# Patient Record
Sex: Male | Born: 1965 | Race: White | Hispanic: No | Marital: Married | Smoking: Former smoker
Health system: Southern US, Community
[De-identification: ages and names within clinical notes are randomized; demographics above are authoritative.]

## PROBLEM LIST (undated history)

## (undated) VITALS — BP 154/85 | HR 71 | Temp 97.9°F | Resp 13 | Wt 175.7 lb

## (undated) DIAGNOSIS — C801 Malignant (primary) neoplasm, unspecified: Secondary | ICD-10-CM

## (undated) DIAGNOSIS — N189 Chronic kidney disease, unspecified: Secondary | ICD-10-CM

## (undated) HISTORY — PX: KNEE SURGERY: SHX244

## (undated) HISTORY — DX: Chronic kidney disease, unspecified: N18.9

## (undated) MED FILL — Acetaminophen Tab 325 MG: ORAL | Qty: 2 | Status: AC

## (undated) MED FILL — Diphenhydramine HCl Cap 25 MG: ORAL | Qty: 1 | Status: AC

---

## 2001-05-22 ENCOUNTER — Emergency Department (HOSPITAL_COMMUNITY): Admission: EM | Admit: 2001-05-22 | Discharge: 2001-05-22 | Payer: Self-pay | Admitting: Emergency Medicine

## 2002-12-16 ENCOUNTER — Encounter: Payer: Self-pay | Admitting: Emergency Medicine

## 2002-12-16 ENCOUNTER — Emergency Department (HOSPITAL_COMMUNITY): Admission: EM | Admit: 2002-12-16 | Discharge: 2002-12-17 | Payer: Self-pay | Admitting: Emergency Medicine

## 2003-04-07 ENCOUNTER — Emergency Department (HOSPITAL_COMMUNITY): Admission: EM | Admit: 2003-04-07 | Discharge: 2003-04-08 | Payer: Self-pay | Admitting: Emergency Medicine

## 2003-04-19 ENCOUNTER — Ambulatory Visit (HOSPITAL_COMMUNITY): Admission: RE | Admit: 2003-04-19 | Discharge: 2003-04-19 | Payer: Self-pay | Admitting: Cardiology

## 2005-06-08 ENCOUNTER — Emergency Department (HOSPITAL_COMMUNITY): Admission: EM | Admit: 2005-06-08 | Discharge: 2005-06-08 | Payer: Self-pay | Admitting: *Deleted

## 2006-10-14 ENCOUNTER — Emergency Department (HOSPITAL_COMMUNITY): Admission: EM | Admit: 2006-10-14 | Discharge: 2006-10-14 | Payer: Self-pay | Admitting: Emergency Medicine

## 2006-11-17 ENCOUNTER — Emergency Department (HOSPITAL_COMMUNITY): Admission: EM | Admit: 2006-11-17 | Discharge: 2006-11-17 | Payer: Self-pay | Admitting: Emergency Medicine

## 2007-11-06 ENCOUNTER — Emergency Department (HOSPITAL_COMMUNITY): Admission: EM | Admit: 2007-11-06 | Discharge: 2007-11-06 | Payer: Self-pay | Admitting: Emergency Medicine

## 2007-11-16 ENCOUNTER — Emergency Department (HOSPITAL_COMMUNITY): Admission: EM | Admit: 2007-11-16 | Discharge: 2007-11-16 | Payer: Self-pay | Admitting: Emergency Medicine

## 2008-04-21 ENCOUNTER — Emergency Department (HOSPITAL_COMMUNITY): Admission: EM | Admit: 2008-04-21 | Discharge: 2008-04-21 | Payer: Self-pay | Admitting: Emergency Medicine

## 2008-11-02 ENCOUNTER — Emergency Department (HOSPITAL_COMMUNITY): Admission: EM | Admit: 2008-11-02 | Discharge: 2008-11-02 | Payer: Self-pay | Admitting: Emergency Medicine

## 2010-08-16 NOTE — Op Note (Signed)
NAME:  Drew Cooper, Drew Cooper                   ACCOUNT NO.:  [id number]   MEDICAL RECORD NO.:  [id number]                   PATIENT TYPE:  OIB   LOCATION:  2899                                 FACILITY:  [facility]   PHYSICIAN:  Armanda Magic, M.D.                  DATE OF BIRTH:  June 07, 1965   DATE OF PROCEDURE:  04/19/2003  DATE OF DISCHARGE:                                 OPERATIVE REPORT   REFERRING PHYSICIAN:  Dr. Julian Reil.   INDICATIONS FOR PROCEDURE:  The patient is a very pleasant 45 year old white  male who has had four syncopal episodes in the past five years.  The last  one was in September when he was sitting down with his wife watching a  movie.  He went to get up to go to the kitchen, all of a sudden, felt a  sudden head rush, felt his heart beating fast and then passed out.  He woke  up and was very diaphoretic.  Symptoms area most consistent with vasovagal  syncope.  2D echocardiogram was completely normal.  He did wear a 24 hour  Holter monitor which revealed normal sinus rhythm, one episode of sinus tach  up to 152 beats per minute, occasional PACs and PVCs.  No correlation of  palpitations with arrhythmia.  His exercise treadmill test was completely  normal with no evidence of distal ischemia by EKG criteria.  He now presents  for tilt table testing.   DESCRIPTION OF PROCEDURE:  The patient was brought to the cardiac  catheterization laboratory in the fasting nonsedated state.  Informed  consent was obtained.  The patient was connected to continuous heart rate,  pulse oximetry monitoring, intermittent blood pressure monitoring.  The  patient's blood pressure was measured supine on the tilt table for a total  of five minutes.  Baseline blood pressure was 125/96 to 136/83 with heart  rates of 84 to 106 beats per minute.  The patient was then placed upright to  70 degrees.  Highest blood pressure achieved on upright tilt was 141/86.  Maximum heart rate on upright tilt was  144/92 mmHg and lowest blood pressure  on upright tilt was 117/95 mmHg.  The patient was asymptomatic during the  tilt.  He was then placed supine.  Isuprel was started at 7.5 mL which was  0.5 mcg.  This was titrated up to achieve a resting heart rate of greater  than 20% baseline.  He was then tilted back upright for a total of 15  minutes.  Maximum heart rate during upright tilt was 126 beats per minute.  Lowest blood pressure achieved on upright tilt was 119/78 mmHg.  Lowest  heart rate was 117 beats per minute.  At the end of the tilt, the patient  was placed supine.  The patient did not have any symptoms during upright  tilt.   ASSESSMENT:  1. History of syncope symptoms consistent with vasovagal syncope.  2. Negative tilt table test for syncope.   PLAN:  Will treat prophylactically for vasovagal syncope.  Start Zoloft 25  mg a day and Toprol XL 25 mg a day and follow-up with Armanda Magic, M.D. in  one month.                                               Armanda Magic, M.D.    TT/MEDQ  D:  04/19/2003  T:  04/19/2003  Job:  [id number]

## 2011-08-19 ENCOUNTER — Emergency Department (HOSPITAL_COMMUNITY)
Admission: EM | Admit: 2011-08-19 | Discharge: 2011-08-19 | Disposition: A | Discharge status: Home / Self Care | Payer: Self-pay | Attending: Emergency Medicine | Admitting: Emergency Medicine

## 2011-08-19 ENCOUNTER — Encounter (HOSPITAL_COMMUNITY): Payer: Self-pay | Admitting: Physical Medicine and Rehabilitation

## 2011-08-19 DIAGNOSIS — F17200 Nicotine dependence, unspecified, uncomplicated: Secondary | ICD-10-CM | POA: Insufficient documentation

## 2011-08-19 DIAGNOSIS — L723 Sebaceous cyst: Secondary | ICD-10-CM | POA: Insufficient documentation

## 2011-08-19 MED ORDER — HYDROCODONE-ACETAMINOPHEN 5-325 MG PO TABS
1.0000 | ORAL_TABLET | Freq: Four times a day (QID) | ORAL | Status: AC | PRN
Start: 2011-08-19 — End: 2011-08-29

## 2011-08-19 NOTE — ED Notes (Signed)
Pt presents to department for evaluation of possible cyst/abscess to center of chest. Ongoing x1 week. Raised area the size of a quarter noted. Area warm and painful to touch. 5/10 pain at present. He is alert and oriented x4. No signs of distress noted at the time.

## 2011-08-19 NOTE — Discharge Instructions (Signed)
Epidermal Cyst An epidermal cyst is usually a small, painless lump under the skin. Cysts often occur on the face, neck, stomach, chest, or genitals. The cyst may be filled with a bad smelling paste. Do not pop your cyst. Popping the cyst can cause pain and puffiness (swelling). HOME CARE   Only take medicines as told by your doctor.   Take your medicine (antibiotics) as told. Finish it even if you start to feel better.  GET HELP RIGHT AWAY IF:  Your cyst is tender, red, or puffy.   You are not getting better, or you are getting worse.   You have any questions or concerns.  MAKE SURE YOU:  Understand these instructions.   Will watch your condition.   Will get help right away if you are not doing well or get worse.  Document Released: 04/24/2004 Document Revised: 03/06/2011 Document Reviewed: 09/23/2010 [facility] Patient Information 2012 [place], [place].  Return in 2 days to have the packing removed.  Keep the area clean.  He can shower.  Did not pull the packing out prematurely.  Return earlier for any new or symptoms.  Antibiotics not needed.  A prescription given for some pain medicine.  If he needed.

## 2011-08-19 NOTE — ED Provider Notes (Addendum)
History   This chart was scribed for Drew Jakes, MD by Brooks Sailors. The patient was seen in room STRE8/STRE8. Patient's care was started at 1740.   CSN: [id number]  Arrival date & time 08/19/11  1740   First MD Initiated Contact with Patient 08/19/11 1840      Chief Complaint  Patient presents with  . Cyst    (Consider location/radiation/quality/duration/timing/severity/associated sxs/prior treatment) HPI LARREN COPES is a 46 y.o. male who presents to the Emergency Department complaining of a 4 cm cyst on his chest onset one week ago and worsening since with associated soreness. Pt says no pus or drainage from the cyst this week. Pt says the cyst was small at first and has been becoming larger for one week.    No past medical history on file.  No past surgical history on file.  History reviewed. No pertinent family history.  History  Substance Use Topics  . Smoking status: Current Everyday Smoker -- 1.0 packs/day    Types: Cigarettes  . Smokeless tobacco: Not on file  . Alcohol Use: Yes      Review of Systems  Constitutional: Negative for fever and chills.  Respiratory: Negative for cough and shortness of breath.   Cardiovascular: Negative for chest pain.  Gastrointestinal: Negative for nausea, vomiting, abdominal pain and diarrhea.  Skin: Negative for rash.  All other systems reviewed and are negative.    Allergies  Review of patient's allergies indicates no known allergies.  Home Medications   Current Outpatient Rx  Name Route Sig Dispense Refill  . DIPHENHYDRAMINE HCL 25 MG PO CAPS Oral Take 25 mg by mouth at bedtime as needed. For allergy symptoms.    Marland Kitchen HYDROCODONE-ACETAMINOPHEN 5-325 MG PO TABS Oral Take 1-2 tablets by mouth every 6 (six) hours as needed for pain. 10 tablet 0    There were no vitals taken for this visit.  Physical Exam  Nursing note and vitals reviewed. Constitutional: He is oriented to person, place, and time. He  appears well-developed and well-nourished.  HENT:  Head: Normocephalic and atraumatic.  Eyes: Conjunctivae and EOM are normal. Pupils are equal, round, and reactive to light.  Neck: Normal range of motion. Neck supple.  Cardiovascular: Normal rate, regular rhythm and normal heart sounds.  Exam reveals no gallop and no friction rub.   No murmur heard. Pulmonary/Chest: Effort normal and breath sounds normal.       Inferior sternum, 3cm redness. 2cm raised area, no pustules. Faint surrounding redness 6cm. Fluctuance in center, 3cm induration.   Abdominal: Soft. Bowel sounds are normal. He exhibits no distension. There is no tenderness.  Musculoskeletal: Normal range of motion.  Neurological: He is alert and oriented to person, place, and time.  Skin: Skin is warm and dry.  Psychiatric: He has a normal mood and affect.    ED Course  Procedures (including critical care time)   Pt seen at 1846  INCISION AND DRAINAGE PROCEDURE NOTE: Patient identification was confirmed and verbal consent was obtained. This procedure was performed by Drew Jakes, MD at 8:15 PM. Site: sternal chest wall Sterile procedures observed Needle size: 20 gauge and 28 guage Anesthetic used (type and amt): lidocaine 2%, 1cc  Blade size: scalpel  Drainage: 5 cc of sebaceous material and purulent material.  Complexity: Complex Packing used quarter inch iodoform packing strip.   Spread with hemostat, sebaceous cyst core  cleaned with betadine and alcohol  Site anesthetized, incision made over site, wound drained  and explored loculations, rinsed with copious amounts of normal saline, wound packed with sterile gauze, covered with dry, sterile dressing.  Pt tolerated procedure well without complications.  Instructions for care discussed verbally and pt provided with additional written instructions for homecare and f/u.   Labs Reviewed - No data to display No results found.   1. Sebaceous cyst        MDM  See  I&D note I&D of an acutely infected sebaceous cyst at the distal sternal area.  Patient tolerated the procedure well.  We'll followup in 2 days out of the packing removed.  And abuts not required.  Culture, not taken due to being a classic sebaceous cyst.      I personally performed the services described in this documentation, which was scribed in my presence. The recorded information has been reviewed and considered.     Drew Jakes, MD 08/19/11 2016  Drew Jakes, MD 08/19/11 2016

## 2013-01-04 ENCOUNTER — Emergency Department (HOSPITAL_COMMUNITY)
Admission: EM | Admit: 2013-01-04 | Discharge: 2013-01-04 | Disposition: A | Discharge status: Home / Self Care | Payer: Self-pay | Attending: Emergency Medicine | Admitting: Emergency Medicine

## 2013-01-04 ENCOUNTER — Encounter (HOSPITAL_COMMUNITY): Payer: Self-pay | Admitting: Emergency Medicine

## 2013-01-04 DIAGNOSIS — K047 Periapical abscess without sinus: Secondary | ICD-10-CM | POA: Insufficient documentation

## 2013-01-04 DIAGNOSIS — K029 Dental caries, unspecified: Secondary | ICD-10-CM | POA: Insufficient documentation

## 2013-01-04 DIAGNOSIS — F17200 Nicotine dependence, unspecified, uncomplicated: Secondary | ICD-10-CM | POA: Insufficient documentation

## 2013-01-04 MED ORDER — PENICILLIN V POTASSIUM 500 MG PO TABS
500.0000 mg | ORAL_TABLET | Freq: Once | ORAL | Status: AC
  Administered 2013-01-04: 500 mg via ORAL
  Filled 2013-01-04: qty 1

## 2013-01-04 MED ORDER — PENICILLIN V POTASSIUM 500 MG PO TABS: 500.0000 mg | ORAL_TABLET | Freq: Three times a day (TID) | ORAL | Status: DC

## 2013-01-04 NOTE — ED Provider Notes (Signed)
Medical screening examination/treatment/procedure(s) were performed by non-physician practitioner and as supervising physician I was immediately available for consultation/collaboration.   T , MD 01/04/13 2328 

## 2013-01-04 NOTE — ED Notes (Signed)
Swelling and pain on l/side of mouth x 24 hrs

## 2013-01-04 NOTE — ED Provider Notes (Signed)
CSN: [id number]     Arrival date & time 01/04/13  1740 History   First MD Initiated Contact with Patient 01/04/13 1746    This chart was scribed for Nelle Don, a non-physician practitioner working with Toy Baker, MD by Lewanda Rife, ED Scribe. This patient was seen in room WTR7/WTR7 and the patient's care was started at 6:03 PM     Chief Complaint  Patient presents with  . Dental Problem  . Facial Swelling   (Consider location/radiation/quality/duration/timing/severity/associated sxs/prior Treatment) The history is provided by the patient. No language interpreter was used.   HPI Comments: Drew Cooper is a 47 y.o. male who presents to the Emergency Department complaining of upper left quadrant dental pain onset yesterday. Describes pain as sharp and non-radiating.  Denies associated neck swelling, fever, difficulty breathing, and dysphagia. Denies any aggravating factors. Reports pain is mildly alleviated with ibuprofen.   History reviewed. No pertinent past medical history. History reviewed. No pertinent past surgical history. Family History  Problem Relation Age of Onset  . Hypertension Mother   . Heart failure Mother   . Diabetes Mother    History  Substance Use Topics  . Smoking status: Current Every Day Smoker -- 1.00 packs/day    Types: Cigarettes  . Smokeless tobacco: Not on file  . Alcohol Use: Yes    Review of Systems  Constitutional: Negative for fever.  HENT: Positive for facial swelling and dental problem. Negative for ear pain, sore throat, trouble swallowing and neck pain.   Respiratory: Negative for shortness of breath and stridor.   Skin: Negative for color change.  Neurological: Negative for headaches.  All other systems reviewed and are negative.   A complete 10 system review of systems was obtained and all systems are negative except as noted in the HPI and PMHx.    Allergies  Review of patient's allergies indicates no known  allergies.  Home Medications   Current Outpatient Rx  Name  Route  Sig  Dispense  Refill  . diphenhydrAMINE (BENADRYL) 25 mg capsule   Oral   Take 25 mg by mouth at bedtime as needed. For allergy symptoms.          There were no vitals taken for this visit. Physical Exam  Nursing note and vitals reviewed. Constitutional: He is oriented to person, place, and time. He appears well-developed and well-nourished. No distress.  HENT:  Head: Normocephalic and atraumatic.  Right Ear: Tympanic membrane, external ear and ear canal normal.  Left Ear: Tympanic membrane, external ear and ear canal normal.  Nose: Nose normal.  Mouth/Throat: Uvula is midline, oropharynx is clear and moist and mucous membranes are normal. No trismus in the jaw. Abnormal dentition. Dental caries present. No dental abscesses or edematous. No tonsillar abscesses.  Advanced periodontal disease. Patient with L maxillary tooth pain and tenderness to palpation in area of 1-3 molars, which are fractured. Gross swelling without discrete abscess.  Eyes: EOM are normal. Pupils are equal, round, and reactive to light.  Neck: Normal range of motion. Neck supple. No tracheal deviation present.  No neck swelling or Lugwig's angina  Cardiovascular: Normal rate.   Pulmonary/Chest: Effort normal. No respiratory distress.  Musculoskeletal: Normal range of motion.  Neurological: He is alert and oriented to person, place, and time.  Skin: Skin is warm and dry.  Psychiatric: He has a normal mood and affect. His behavior is normal.    ED Course  Procedures (including critical care time)  COORDINATION  OF CARE:  Nursing notes reviewed. Vital signs reviewed. Initial pt interview and examination performed.  Treatment plan initiated: Medications  penicillin v potassium (VEETID) tablet 500 mg (not administered)   Initial diagnostic testing ordered.    6:08 PM-Discussed treatment plan, which includes penicillin PO to treat  infection with pt at bedside and pt agreed to plan.   Pt informed of return precautions and is comfortable with discharge at this time.    Labs Review Labs Reviewed - No data to display Imaging Review No results found.  Patient seen and examined. Work-up initiated. Medications ordered.   Vital signs reviewed and are as follows: Filed Vitals:   01/04/13 1820  BP: 138/88  Pulse: 89  Temp: 98.3 F (36.8 C)  Resp: 18    Patient counseled to take prescribed medications as directed, return with worsening facial or neck swelling, and to follow-up with their dentist as soon as possible.    MDM   1. Dental abscess    Patient with toothache.  No gross abscess.  Exam unconcerning for Ludwig's angina or other deep tissue infection in neck.  Will treat with penicillin. To control pain at home with NSAIDs. Urged patient to follow-up with dentist.    I personally performed the services described in this documentation, which was scribed in my presence. The recorded information has been reviewed and is accurate.     Renne Crigler, PA-C 01/04/13 1826

## 2013-05-03 ENCOUNTER — Emergency Department (HOSPITAL_COMMUNITY)
Admission: EM | Admit: 2013-05-03 | Discharge: 2013-05-03 | Disposition: A | Discharge status: Home / Self Care | Payer: Self-pay | Attending: Emergency Medicine | Admitting: Emergency Medicine

## 2013-05-03 ENCOUNTER — Encounter (HOSPITAL_COMMUNITY): Payer: Self-pay | Admitting: Emergency Medicine

## 2013-05-03 DIAGNOSIS — R6884 Jaw pain: Secondary | ICD-10-CM | POA: Insufficient documentation

## 2013-05-03 DIAGNOSIS — F17200 Nicotine dependence, unspecified, uncomplicated: Secondary | ICD-10-CM | POA: Insufficient documentation

## 2013-05-03 LAB — CBC
HCT: 41.2 % (ref 39.0–52.0)
Hemoglobin: 14.2 g/dL (ref 13.0–17.0)
MCH: 30 pg (ref 26.0–34.0)
MCHC: 34.5 g/dL (ref 30.0–36.0)
MCV: 86.9 fL (ref 78.0–100.0)
Platelets: 241 10*3/uL (ref 150–400)
RBC: 4.74 MIL/uL (ref 4.22–5.81)
RDW: 12.9 % (ref 11.5–15.5)
WBC: 16.7 10*3/uL — ABNORMAL HIGH (ref 4.0–10.5)

## 2013-05-03 LAB — URINALYSIS, ROUTINE W REFLEX MICROSCOPIC
Bilirubin Urine: NEGATIVE
Glucose, UA: NEGATIVE mg/dL
Hgb urine dipstick: NEGATIVE
Ketones, ur: NEGATIVE mg/dL
Leukocytes, UA: NEGATIVE
Nitrite: NEGATIVE
Protein, ur: NEGATIVE mg/dL
Specific Gravity, Urine: 1.019 (ref 1.005–1.030)
Urobilinogen, UA: 0.2 mg/dL (ref 0.0–1.0)
pH: 5 (ref 5.0–8.0)

## 2013-05-03 LAB — BASIC METABOLIC PANEL
BUN: 13 mg/dL (ref 6–23)
CO2: 24 mEq/L (ref 19–32)
Calcium: 8.6 mg/dL (ref 8.4–10.5)
Chloride: 104 mEq/L (ref 96–112)
Creatinine, Ser: 0.83 mg/dL (ref 0.50–1.35)
GFR calc Af Amer: >90 mL/min (ref >90)
GFR calc non Af Amer: >90 mL/min (ref >90)
Glucose, Bld: 103 mg/dL — ABNORMAL HIGH (ref 70–99)
Potassium: 4.1 mEq/L (ref 3.7–5.3)
Sodium: 138 mEq/L (ref 137–147)

## 2013-05-03 MED ORDER — AMOXICILLIN-POT CLAVULANATE 875-125 MG PO TABS: 1.0000 | ORAL_TABLET | Freq: Two times a day (BID) | ORAL | Status: DC

## 2013-05-03 MED ORDER — SODIUM CHLORIDE 0.9 % IV BOLUS (SEPSIS)
1000.0000 mL | Freq: Once | INTRAVENOUS | Status: AC
  Administered 2013-05-03: 1000 mL via INTRAVENOUS

## 2013-05-03 MED ORDER — HYDROCODONE-ACETAMINOPHEN 5-325 MG PO TABS: 1.0000 | ORAL_TABLET | Freq: Four times a day (QID) | ORAL | Status: DC | PRN

## 2013-05-03 MED ORDER — IBUPROFEN 800 MG PO TABS: 800.0000 mg | ORAL_TABLET | Freq: Three times a day (TID) | ORAL | Status: DC | PRN

## 2013-05-03 MED ORDER — MORPHINE SULFATE 4 MG/ML IJ SOLN
4.0000 mg | Freq: Once | INTRAMUSCULAR | Status: AC
  Administered 2013-05-03: 4 mg via INTRAVENOUS
  Filled 2013-05-03: qty 1

## 2013-05-03 MED ORDER — KETOROLAC TROMETHAMINE 30 MG/ML IJ SOLN
30.0000 mg | Freq: Once | INTRAMUSCULAR | Status: AC
  Administered 2013-05-03: 30 mg via INTRAVENOUS
  Filled 2013-05-03: qty 1

## 2013-05-03 NOTE — ED Notes (Signed)
Patient states he has had right ear pain x 3 weeks and today is worse. Patient stated he had LOC today around noon today. Patient states he was taking a family member's Penicillin, but not getting any better.

## 2013-05-03 NOTE — Progress Notes (Signed)
   CARE MANAGEMENT ED NOTE 05/03/2013  Patient:  Drew Cooper,Drew Cooper   Account Number:  [id number]  Date Initiated:  05/03/2013  Documentation initiated by:  Radford PaxFERRERO,  Subjective/Objective Assessment:   Patient presents to Ed with right ear pain     Subjective/Objective Assessment Detail:   No pertinent pmhx.     Action/Plan:   Action/Plan Detail:   Anticipated DC Date:       Status Recommendation to Physician:   Result of Recommendation:    Other ED Services  Consult Working Plan    DC Planning Services  Other  PCP issues    Choice offered to / List presented to:            Status of service:  Completed, signed off  ED Comments:   ED Comments Detail:  Patient confirms he doe snot have apcp or insurnace.  [facility] provided patient with Cooper list of pcps who accept self pay patients, information regarding Medicaid and Affordable Care Act for insurance, phone number to inquire about the orange card, list of discount pharmacies and website needymeds.org for medication assistance, list of financiall assistance in the community such as [employer] and local churches, urban ministries, [employer] health and wellness and dental assistance for uninsured patients.  Patient thankful for resources.  No further EDCM needs at this time.

## 2013-05-03 NOTE — Discharge Instructions (Signed)
Return here as needed.  Followup with your primary care Dr. for recheck or an urgent care.  Use warm compresses or heat over the area that is sore

## 2013-05-03 NOTE — ED Provider Notes (Signed)
CSN: [id number]     Arrival date & time 05/03/13  1554 History   First MD Initiated Contact with Patient 05/03/13 1614     Chief Complaint  Patient presents with  . Otalgia   (Consider location/radiation/quality/duration/timing/severity/associated sxs/prior Treatment) HPI Patient presents emergency department with pain in his right jaw and ear.  Patient, states, that started 3 weeks ago, but was worse today.  The patient, states, that he then lost consciousness for one to 2 seconds earlier today.  The patient, states, that he's been taking it.  Family members, penicillin.  Patient, states he thinks it may be a tooth over his ear.  He is unsure.  Patient denies fever, nausea, vomiting, diarrhea, headache, blurred vision, weakness, numbness, dizziness rash or cough History reviewed. No pertinent past medical history. Past Surgical History  Procedure Laterality Date  . Knee surgery     Family History  Problem Relation Age of Onset  . Hypertension Mother   . Heart failure Mother   . Diabetes Mother    History  Substance Use Topics  . Smoking status: Current Every Day Smoker -- 0.50 packs/day    Types: Cigarettes  . Smokeless tobacco: Never Used  . Alcohol Use: Yes     Comment: 6 pack week    Review of Systems All other systems negative except as documented in the HPI. All pertinent positives and negatives as reviewed in the HPI. Allergies  Review of patient's allergies indicates no known allergies.  Home Medications   Current Outpatient Rx  Name  Route  Sig  Dispense  Refill  . ibuprofen (ADVIL,MOTRIN) 200 MG tablet   Oral   Take 400 mg by mouth every 6 (six) hours as needed (pain).          BP 135/82  Pulse 93  Temp(Src) 97.5 F (36.4 C) (Oral)  Resp 18  Ht 5\' 10"  (1.778 m)  Wt 200 lb (90.719 kg)  BMI 28.70 kg/m2  SpO2 100% Physical Exam  Nursing note and vitals reviewed. Constitutional: He is oriented to person, place, and time. He appears well-developed and  well-nourished. No distress.  HENT:  Head: Normocephalic and atraumatic.  Mouth/Throat: Oropharynx is clear and moist.  Eyes: Pupils are equal, round, and reactive to light.  Neck: Normal range of motion. Neck supple.  Cardiovascular: Normal rate, regular rhythm and normal heart sounds.  Exam reveals no gallop and no friction rub.   No murmur heard. Pulmonary/Chest: Effort normal and breath sounds normal. No respiratory distress.  Neurological: He is alert and oriented to person, place, and time. He exhibits normal muscle tone. Coordination normal.  Skin: Skin is warm and dry. No rash noted. No erythema.    ED Course  Procedures (including critical care time) Labs Review Labs Reviewed  CBC - Abnormal; Notable for the following:    WBC 16.7 (*)    All other components within normal limits  BASIC METABOLIC PANEL - Abnormal; Notable for the following:    Glucose, Bld 103 (*)    All other components within normal limits  URINALYSIS, ROUTINE W REFLEX MICROSCOPIC   Imaging Review No results found.  EKG Interpretation    Date/Time:  Tuesday May 03 2013 16:18:12 EST Ventricular Rate:  81 PR Interval:  128 QRS Duration: 100 QT Interval:  363 QTC Calculation: 421 R Axis:   -25 Text Interpretation:  Sinus rhythm Borderline left axis deviation No significant change since last tracing Confirmed by KNAPP  MD-J, JON ([id number]) on 05/03/2013 4:54:35  PM            Patient most likely has dental pain, based on his physical exam, his ear.  Does not show any signs of infection.  The TM is normal.  The canal was normal without signs of inflammation or redness.  Patient does have multiple areas of dental decay.  Patient does not have any airway compromise or signs of swelling under the tongue.A.  She is a rest return here as needed.  The patient was able to ambulate without difficulty   TARAN HAYNESWORTH, PA-C 05/04/13 0136

## 2013-05-04 NOTE — ED Provider Notes (Signed)
Medical screening examination/treatment/procedure(s) were performed by non-physician practitioner and as supervising physician I was immediately available for consultation/collaboration.  EKG Interpretation    Date/Time:  Tuesday May 03 2013 16:18:12 EST Ventricular Rate:  81 PR Interval:  128 QRS Duration: 100 QT Interval:  363 QTC Calculation: 421 R Axis:   -25 Text Interpretation:  Sinus rhythm Borderline left axis deviation No significant change since last tracing Confirmed by   MD-J,  ([id number]) on 05/03/2013 4:54:35 PM             Celene KrasJon R , MD 05/04/13 2255

## 2014-01-04 ENCOUNTER — Emergency Department (HOSPITAL_COMMUNITY)
Admission: EM | Admit: 2014-01-04 | Discharge: 2014-01-05 | Disposition: A | Discharge status: Home / Self Care | Payer: Self-pay | Attending: Emergency Medicine | Admitting: Emergency Medicine

## 2014-01-04 ENCOUNTER — Emergency Department (HOSPITAL_COMMUNITY): Payer: Self-pay

## 2014-01-04 ENCOUNTER — Encounter (HOSPITAL_COMMUNITY): Payer: Self-pay | Admitting: Emergency Medicine

## 2014-01-04 DIAGNOSIS — W1830XA Fall on same level, unspecified, initial encounter: Secondary | ICD-10-CM | POA: Insufficient documentation

## 2014-01-04 DIAGNOSIS — L03114 Cellulitis of left upper limb: Secondary | ICD-10-CM | POA: Insufficient documentation

## 2014-01-04 DIAGNOSIS — Z792 Long term (current) use of antibiotics: Secondary | ICD-10-CM | POA: Insufficient documentation

## 2014-01-04 DIAGNOSIS — S50812A Abrasion of left forearm, initial encounter: Secondary | ICD-10-CM | POA: Insufficient documentation

## 2014-01-04 DIAGNOSIS — Y9289 Other specified places as the place of occurrence of the external cause: Secondary | ICD-10-CM | POA: Insufficient documentation

## 2014-01-04 DIAGNOSIS — Z79899 Other long term (current) drug therapy: Secondary | ICD-10-CM | POA: Insufficient documentation

## 2014-01-04 DIAGNOSIS — Z720 Tobacco use: Secondary | ICD-10-CM | POA: Insufficient documentation

## 2014-01-04 DIAGNOSIS — Y9389 Activity, other specified: Secondary | ICD-10-CM | POA: Insufficient documentation

## 2014-01-04 MED ORDER — LIDOCAINE HCL 1 % IJ SOLN
INTRAMUSCULAR | Status: AC
  Administered 2014-01-04: 20 mL
  Filled 2014-01-04: qty 20

## 2014-01-04 MED ORDER — CEFTRIAXONE SODIUM 1 G IJ SOLR
1.0000 g | Freq: Once | INTRAMUSCULAR | Status: AC
  Administered 2014-01-04: 1 g via INTRAMUSCULAR
  Filled 2014-01-04: qty 10

## 2014-01-04 MED ORDER — SULFAMETHOXAZOLE-TRIMETHOPRIM 800-160 MG PO TABS: 1.0000 | ORAL_TABLET | Freq: Two times a day (BID) | ORAL | Status: AC

## 2014-01-04 MED ORDER — CEFTRIAXONE SODIUM 250 MG IJ SOLR: 250.0000 mg | Freq: Once | INTRAMUSCULAR | Status: DC

## 2014-01-04 MED ORDER — CEPHALEXIN 500 MG PO CAPS: 500.0000 mg | ORAL_CAPSULE | Freq: Four times a day (QID) | ORAL | Status: DC

## 2014-01-04 NOTE — Discharge Instructions (Signed)
Take your antibiotics as prescribed. Do not stop her antibiotics early. Return to emergency department if you develop fever, worsening redness despite antibiotic treatment, red streaking up or down your arm, or any of the symptoms listed below.  Cellulitis Cellulitis is an infection of the skin and the tissue beneath it. The infected area is usually red and tender. Cellulitis occurs most often in the arms and lower legs.  CAUSES  Cellulitis is caused by bacteria that enter the skin through cracks or cuts in the skin. The most common types of bacteria that cause cellulitis are staphylococci and streptococci. SIGNS AND SYMPTOMS   Redness and warmth.  Swelling.  Tenderness or pain.  Fever. DIAGNOSIS  Your health care provider can usually determine what is wrong based on a physical exam. Blood tests may also be done. TREATMENT  Treatment usually involves taking an antibiotic medicine. HOME CARE INSTRUCTIONS   Take your antibiotic medicine as directed by your health care provider. Finish the antibiotic even if you start to feel better.  Keep the infected arm or leg elevated to reduce swelling.  Apply a warm cloth to the affected area up to 4 times per day to relieve pain.  Take medicines only as directed by your health care provider.  Keep all follow-up visits as directed by your health care provider. SEEK MEDICAL CARE IF:   You notice red streaks coming from the infected area.  Your red area gets larger or turns dark in color.  Your bone or joint underneath the infected area becomes painful after the skin has healed.  Your infection returns in the same area or another area.  You notice a swollen bump in the infected area.  You develop new symptoms.  You have a fever. SEEK IMMEDIATE MEDICAL CARE IF:   You feel very sleepy.  You develop vomiting or diarrhea.  You have a general ill feeling (malaise) with muscle aches and pains. MAKE SURE YOU:   Understand these  instructions.  Will watch your condition.  Will get help right away if you are not doing well or get worse. Document Released: 12/25/2004 Document Revised: 08/01/2013 Document Reviewed: 06/02/2011 [facility] Patient Information 2015 [place]. This information is not intended to replace advice given to you by your health care provider. Make sure you discuss any questions you have with your health care provider.

## 2014-01-04 NOTE — ED Notes (Addendum)
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 Now has arm pain and swelling, sts it "feels hot in there." CMS intact.

## 2014-01-04 NOTE — ED Provider Notes (Signed)
CSN: [id number]     Arrival date & time 01/04/14  1725 History   First MD Initiated Contact with Patient 01/04/14 2250     This chart was scribed for non-physician practitioner working with Dr. Tomasita Crumble by Arlan Organ, ED Scribe. This patient was seen in room WTR9/WTR9 and the patient's care was started at 12:51 AM.   Chief Complaint  Patient presents with  . Arm Pain  . Arm Swelling   The history is provided by the patient. No language interpreter was used.   HPI Comments: Drew Cooper is a 48 y.o. male who presents to the Emergency Department complaining of constant, moderate L forearm pain x 5 days that is unchanged. Pt also reports associated swelling, redness, and warmth  to the area. Drew Cooper states he fell and scraped his arm against some shingles on a roof while working on a work project. Pt has not tried any OTC medications or any home remedies to help manages symptoms. No loss of sensation, weakness, or numbness. No known allergies to medications.  History reviewed. No pertinent past medical history. Past Surgical History  Procedure Laterality Date  . Knee surgery     Family History  Problem Relation Age of Onset  . Hypertension Mother   . Heart failure Mother   . Diabetes Mother    History  Substance Use Topics  . Smoking status: Current Every Day Smoker -- 0.50 packs/day    Types: Cigarettes  . Smokeless tobacco: Never Used  . Alcohol Use: Yes     Comment: 6 pack week    Review of Systems  Constitutional: Negative for fever and chills.  Musculoskeletal: Positive for arthralgias.  Neurological: Negative for weakness and numbness.  All other systems reviewed and are negative.   Allergies  Review of patient's allergies indicates no known allergies.  Home Medications   Prior to Admission medications   Medication Sig Start Date End Date Taking? Authorizing Provider  amoxicillin-clavulanate (AUGMENTIN) 875-125 MG per tablet Take 1 tablet by mouth  every 12 (twelve) hours. 05/03/13   Jamesetta Orleans Lawyer, PA-C  cephALEXin (KEFLEX) 500 MG capsule Take 1 capsule (500 mg total) by mouth 4 (four) times daily. 01/04/14   Antony Madura, PA-C  HYDROcodone-acetaminophen (NORCO/VICODIN) 5-325 MG per tablet Take 1 tablet by mouth every 6 (six) hours as needed for moderate pain. 05/03/13   Jamesetta Orleans Lawyer, PA-C  ibuprofen (ADVIL,MOTRIN) 200 MG tablet Take 400 mg by mouth every 6 (six) hours as needed (pain).    Historical Provider, MD  ibuprofen (ADVIL,MOTRIN) 800 MG tablet Take 1 tablet (800 mg total) by mouth every 8 (eight) hours as needed. 05/03/13   Jamesetta Orleans Lawyer, PA-C  sulfamethoxazole-trimethoprim (BACTRIM DS,SEPTRA DS) 800-160 MG per tablet Take 1 tablet by mouth 2 (two) times daily. 01/04/14 01/11/14  Antony Madura, PA-C   Triage Vitals: BP 142/94  Pulse 81  Temp(Src) 98.2 F (36.8 C) (Oral)  Resp 18  SpO2 100%   Physical Exam  Nursing note and vitals reviewed. Constitutional: He is oriented to person, place, and time. He appears well-developed and well-nourished. No distress.  Nontoxic/nonseptic appearing  HENT:  Head: Normocephalic and atraumatic.  Eyes: Conjunctivae and EOM are normal. No scleral icterus.  Neck: Normal range of motion. Neck supple.  Cardiovascular: Normal rate, regular rhythm and intact distal pulses.   Distal radial pulse 2+ in left upper extremity  Pulmonary/Chest: Effort normal. No respiratory distress.  Musculoskeletal: Normal range of motion. He exhibits tenderness.  Mild tenderness to palpation of the dorsal aspect of proximal left forearm. There is mild associated warmth and erythema; small 1cm diameter area of induration c/w endorsed hx of purulent drainage. No red linear streaking.  Neurological: He is alert and oriented to person, place, and time. He exhibits normal muscle tone. Coordination normal.  Sensation to light touch intact. 5/5 strength against resistance with left elbow flexion and extension.   Skin: Skin is warm and dry. No rash noted. He is not diaphoretic. No erythema. No pallor.  Psychiatric: He has a normal mood and affect. His behavior is normal.    ED Course  Procedures (including critical care time)  DIAGNOSTIC STUDIES: Oxygen Saturation is 100% on RA, Normal by my interpretation.    COORDINATION OF CARE: 12:51 AM- Will order DG Elbow complete L and DG Forearm L. Discussed treatment plan with pt at bedside and pt agreed to plan.     Labs Review Labs Reviewed - No data to display  Imaging Review Dg Elbow Complete Left  01/04/2014   CLINICAL DATA:  Patient hit arm on scaffold at work several days ago with persistent pain  EXAM: LEFT ELBOW - COMPLETE 3+ VIEW  COMPARISON:  None.  FINDINGS: Frontal, lateral, and bilateral oblique views were obtained. There is no fracture, dislocation, or effusion. There is osteoarthritic change with spurring along the olecranon and coracoid processes of the proximal ulna. No erosive change.  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 No fracture or dislocation. No appreciable joint effusion.   Electronically Signed   By: Bretta BangWilliam  Woodruff M.D.   On: 01/04/2014 21:02   Dg Forearm Left  01/04/2014   CLINICAL DATA:  Blow to the left forearm a few days ago. Pain with swelling and redness.  EXAM: LEFT FOREARM - 2 VIEW  COMPARISON:  None.  FINDINGS: No acute bony or joint abnormality is identified. No soft tissue gas collection or radiopaque foreign body is seen. Mild degenerative change is present about the left elbow.  IMPRESSION: No acute abnormality.   Electronically Signed   By: Drusilla Kannerhomas  Dalessio M.D.   On: 01/04/2014 21:02     EKG Interpretation None      MDM   Final diagnoses:  Cellulitis of arm, left    48 year old male presents to the emergency department for pain, redness, and swelling to left upper extremity. Symptom onset after patient had a fall 5 days ago. Patient has evidence of abrasion with secondary cellulitic  changes. No active purulent drainage. No fluctuance. No evidence of drainable abscess. Normal range of motion of left elbow. No evidence of septic joint. Imaging negative for free air or bony deformity. Treatment initiated with IM Rocephin in ED. Will discharge patient on Bactrim and Keflex. Return precautions discussed in provided. Patient agreeable to plan with no unaddressed concerns.  I personally performed the services described in this documentation, which was scribed in my presence. The recorded information has been reviewed and is accurate.    Filed Vitals:   01/04/14 1742 01/04/14 2150 01/05/14 0003  BP: 133/88 148/84 142/94  Pulse: 96 88 81  Temp: 98.2 F (36.8 C)    TempSrc: Oral    Resp: 20 18 18   SpO2: 99% 100% 100%     Antony MaduraKelly , PA-C 01/05/14 0056

## 2014-01-05 NOTE — ED Provider Notes (Signed)
Medical screening examination/treatment/procedure(s) were performed by non-physician practitioner and as supervising physician I was immediately available for consultation/collaboration.   EKG Interpretation None        Tomasita CrumbleAdeleke , MD 01/05/14 [phone]

## 2016-08-04 ENCOUNTER — Emergency Department (HOSPITAL_COMMUNITY)
Admission: EM | Admit: 2016-08-04 | Discharge: 2016-08-04 | Disposition: A | Discharge status: Home / Self Care | Payer: Self-pay | Attending: Emergency Medicine | Admitting: Emergency Medicine

## 2016-08-04 ENCOUNTER — Encounter (HOSPITAL_COMMUNITY): Payer: Self-pay | Admitting: *Deleted

## 2016-08-04 DIAGNOSIS — H10501 Unspecified blepharoconjunctivitis, right eye: Secondary | ICD-10-CM | POA: Insufficient documentation

## 2016-08-04 DIAGNOSIS — F17210 Nicotine dependence, cigarettes, uncomplicated: Secondary | ICD-10-CM | POA: Insufficient documentation

## 2016-08-04 DIAGNOSIS — L03211 Cellulitis of face: Secondary | ICD-10-CM

## 2016-08-04 MED ORDER — LIDOCAINE HCL (PF) 1 % IJ SOLN
INTRAMUSCULAR | Status: AC
  Administered 2016-08-04: 5 mL
  Filled 2016-08-04: qty 5

## 2016-08-04 MED ORDER — TOBRAMYCIN-DEXAMETHASONE 0.3-0.1 % OP SUSP
1.0000 [drp] | Freq: Once | OPHTHALMIC | Status: AC
  Administered 2016-08-04: 1 [drp] via OPHTHALMIC
  Filled 2016-08-04 (×2): qty 2.5

## 2016-08-04 MED ORDER — TETRACAINE HCL 0.5 % OP SOLN
2.0000 [drp] | Freq: Once | OPHTHALMIC | Status: AC
  Administered 2016-08-04: 2 [drp] via OPHTHALMIC
  Filled 2016-08-04: qty 2

## 2016-08-04 MED ORDER — FLUORESCEIN SODIUM 0.6 MG OP STRP
1.0000 | ORAL_STRIP | Freq: Once | OPHTHALMIC | Status: AC
  Administered 2016-08-04: 1 via OPHTHALMIC
  Filled 2016-08-04: qty 1

## 2016-08-04 MED ORDER — CEPHALEXIN 500 MG PO CAPS: 500.0000 mg | ORAL_CAPSULE | Freq: Four times a day (QID) | ORAL | 0 refills | Status: DC

## 2016-08-04 NOTE — ED Triage Notes (Signed)
Patient presents with bilateral eye issues. Right eye red, starting to swell, painful. Left eye not red but area below eye is red, with what appears to be abscess forming.

## 2016-08-04 NOTE — ED Provider Notes (Signed)
[facility] Provider Note   CSN: [id number] Arrival date & time: 08/04/16  1705 By signing my name below, I, Drew Cooper, attest that this documentation has been prepared under the direction and in the presence of Drew Cooper , FNP. Electronically Signed: Bridgette HabermannMaria Cooper, ED Scribe. 08/04/16. 6:17 PM.  History   Chief Complaint Chief Complaint  Patient presents with  . Eye Problem    right eye redness, left eye with small abscess just b elow, lateral area    HPI The history is provided by the patient. No language interpreter was used.   HPI Comments: Drew DadChristopher A Cooper is a 51 y.o. male with no pertinent PMHx, who presents to the Emergency Department complaining of a moderate, gradually worsening area of pain, redness, and swelling underneath left eye and in right eye and eyelid onset one week ago. Pt also has associated blurred vision in the right eye. Pt states pain is exacerbated with palpation and direct pressure. He has tried eyedrops with no relief. Denies fever, chills, drainage from the area.   History reviewed. No pertinent past medical history.  There are no active problems to display for this patient.   Past Surgical History:  Procedure Laterality Date  . KNEE SURGERY         Home Medications    Prior to Admission medications   Medication Sig Start Date End Date Taking? Authorizing Provider  amoxicillin-clavulanate (AUGMENTIN) 875-125 MG per tablet Take 1 tablet by mouth every 12 (twelve) hours. 05/03/13   Lawyer, Cristal Deerhristopher, PA-C  cephALEXin (KEFLEX) 500 MG capsule Take 1 capsule (500 mg total) by mouth 4 (four) times daily. 01/04/14   Antony MaduraHumes, Kelly, PA-C  HYDROcodone-acetaminophen (NORCO/VICODIN) 5-325 MG per tablet Take 1 tablet by mouth every 6 (six) hours as needed for moderate pain. 05/03/13   Lawyer, Cristal Deerhristopher, PA-C  ibuprofen (ADVIL,MOTRIN) 200 MG tablet Take 400 mg by mouth every 6 (six) hours as needed (pain).    [provider]  ibuprofen  (ADVIL,MOTRIN) 800 MG tablet Take 1 tablet (800 mg total) by mouth every 8 (eight) hours as needed. 05/03/13   Charlestine NightLawyer, Momen, PA-C    Family History Family History  Problem Relation Age of Onset  . Hypertension Mother   . Heart failure Mother   . Diabetes Mother     Social History Social History  Substance Use Topics  . Smoking status: Current Every Day Smoker    Packs/day: 0.50    Types: Cigarettes  . Smokeless tobacco: Never Used  . Alcohol use Yes     Comment: 6 pack week     Allergies   Patient has no known allergies.   Review of Systems Review of Systems  Constitutional: Negative for chills and fever.  Eyes: Positive for pain, redness and visual disturbance.  Skin: Positive for color change.  All other systems reviewed and are negative.  Physical Exam Updated Vital Signs BP (!) 137/97 (BP Location: Left Arm)   Pulse 98   Temp 98.7 F (37.1 C) (Oral)   Resp 14   Ht 5\' 10"  (1.778 m)   Wt 215 lb (97.5 kg)   SpO2 98%   BMI 30.85 kg/m   Physical Exam  Constitutional: He appears well-developed and well-nourished.  HENT:  Head: Normocephalic.  Eyes: EOM are normal. Pupils are equal, round, and reactive to light. Right conjunctiva is injected.  Conjunctival injection with upper lid swelling on the right eye. Firm, tender, nodule on the outer aspect of the left orbit with surrounding  erythema.  Cardiovascular: Normal rate, regular rhythm and normal heart sounds.  Exam reveals no gallop and no friction rub.   No murmur heard. Pulmonary/Chest: Effort normal. No respiratory distress.  Abdominal: He exhibits no distension.  Musculoskeletal: Normal range of motion.  Neurological: He is alert.  Skin: Skin is warm and dry.  Psychiatric: He has a normal mood and affect. His behavior is normal.  Nursing note and vitals reviewed.        ED Treatments / Results  DIAGNOSTIC STUDIES: Oxygen Saturation is 98% on RA, normal by my interpretation.    COORDINATION OF CARE: 6:17 PM-Discussed next steps with pt. Pt verbalized understanding and is agreeable with the plan.   Labs (all labs ordered are listed, but only abnormal results are displayed) Labs Reviewed - No data to display  EKG  EKG Interpretation None       Radiology No results found.  Procedures Procedures (including critical care time)  Medications Ordered in ED Medications  tetracaine (PONTOCAINE) 0.5 % ophthalmic solution 2 drop (2 drops Right Eye Given 08/04/16 1903)  fluorescein ophthalmic strip 1 strip (1 strip Right Eye Given 08/04/16 1903)  tobramycin-dexamethasone (TOBRADEX) ophthalmic suspension 1 drop (1 drop Right Eye Given 08/04/16 2054)  lidocaine (PF) (XYLOCAINE) 1 % injection (5 mLs  Given 08/04/16 2127)     Initial Impression / Assessment and Plan / ED Course  I have reviewed the triage vital signs and the nursing notes.  Pertinent labs & imaging results that were available during my care of the patient were reviewed by me and considered in my medical decision making (see chart for details).     Patient presentation consistent with conjunctivitis.  No evidence of corneal abrasions, entrapment, consensual photophobia, or herpes keratitis.  Presentation not concerning for iritis, or corneal abrasions.  Pt discharged with tobradex.  Personal hygiene and frequent handwashing discussed.  Patient advised to follow up with ophthalmologist if symptoms persist or worsen. Return precautions discussed.  Patient verbalizes understanding and is agreeable with discharge.  Patient presentation consistent with cellulitis. Afebrile. No tachycardia, hypotension or other symptoms suggestive of severe infection. Pt advised to follow up for wound check in 2-3 days for worsening systemic symptoms, new lymphangitis, or significant spread of erythema. Will discharge with keflex. Return precautions discussed. Pt appears safe for discharge.   Final Clinical Impressions(s) / ED  Diagnoses   Final diagnoses:  Blepharoconjunctivitis of right eye, unspecified blepharoconjunctivitis type  Facial cellulitis    New Prescriptions Discharge Medication List as of 08/04/2016  9:19 PM     I personally performed the services described in this documentation, which was scribed in my presence. The recorded information has been reviewed and is accurate.     Drew Morn, NP 08/05/16 8119    Cathren Laine, MD 08/11/16 [phone]

## 2016-08-04 NOTE — Discharge Instructions (Signed)
Instill one drop of the antibiotic to the right eye every four hours while awake.

## 2016-08-04 NOTE — ED Notes (Signed)
Called pharmacy for medication

## 2017-05-15 ENCOUNTER — Encounter (HOSPITAL_COMMUNITY): Payer: Self-pay | Admitting: Emergency Medicine

## 2017-05-15 ENCOUNTER — Other Ambulatory Visit: Payer: Self-pay

## 2017-05-15 ENCOUNTER — Ambulatory Visit (HOSPITAL_COMMUNITY)
Admission: EM | Admit: 2017-05-15 | Discharge: 2017-05-15 | Disposition: A | Discharge status: Home / Self Care | Payer: Self-pay | Attending: Family Medicine | Admitting: Family Medicine

## 2017-05-15 DIAGNOSIS — F17210 Nicotine dependence, cigarettes, uncomplicated: Secondary | ICD-10-CM | POA: Insufficient documentation

## 2017-05-15 DIAGNOSIS — L03115 Cellulitis of right lower limb: Secondary | ICD-10-CM | POA: Insufficient documentation

## 2017-05-15 DIAGNOSIS — L02619 Cutaneous abscess of unspecified foot: Secondary | ICD-10-CM | POA: Insufficient documentation

## 2017-05-15 DIAGNOSIS — Z79899 Other long term (current) drug therapy: Secondary | ICD-10-CM | POA: Insufficient documentation

## 2017-05-15 MED ORDER — MELOXICAM 7.5 MG PO TABS: 7.5000 mg | ORAL_TABLET | Freq: Every day | ORAL | 0 refills | Status: DC

## 2017-05-15 MED ORDER — CEPHALEXIN 500 MG PO CAPS: 500.0000 mg | ORAL_CAPSULE | Freq: Four times a day (QID) | ORAL | 0 refills | Status: DC

## 2017-05-15 MED ORDER — MUPIROCIN 2 % EX OINT: 1.0000 "application " | TOPICAL_OINTMENT | Freq: Two times a day (BID) | CUTANEOUS | 0 refills | Status: DC

## 2017-05-15 NOTE — ED Provider Notes (Signed)
[facility]    CSN: [id number] Arrival date & time: 05/15/17  1631     History   Chief Complaint Chief Complaint  Patient presents with  . Foot Pain    HPI Drew Cooper is a 52 y.o. male.   52 year old male comes in for blister to the right foot for the past week.  States that area has been getting larger and more painful.  Has had some spreading erythema, denies fever.  Has not done anything for it.  States still able to move toe and foot.      History reviewed. No pertinent past medical history.  There are no active problems to display for this patient.   Past Surgical History:  Procedure Laterality Date  . KNEE SURGERY         Home Medications    Prior to Admission medications   Medication Sig Start Date End Date Taking? Authorizing Provider  cephALEXin (KEFLEX) 500 MG capsule Take 1 capsule (500 mg total) by mouth 4 (four) times daily. 05/15/17   Cathie HoopsYu,  V, PA-C  HYDROcodone-acetaminophen (NORCO/VICODIN) 5-325 MG per tablet Take 1 tablet by mouth every 6 (six) hours as needed for moderate pain. Patient not taking: Reported on 05/15/2017 05/03/13   Charlestine NightLawyer, [place], PA-C  ibuprofen (ADVIL,MOTRIN) 200 MG tablet Take 400 mg by mouth every 6 (six) hours as needed (pain).    [provider]  ibuprofen (ADVIL,MOTRIN) 800 MG tablet Take 1 tablet (800 mg total) by mouth every 8 (eight) hours as needed. 05/03/13   Lawyer, Cristal Deerhristopher, PA-C  meloxicam (MOBIC) 7.5 MG tablet Take 1 tablet (7.5 mg total) by mouth daily. 05/15/17   Cathie HoopsYu,  V, PA-C  mupirocin ointment (BACTROBAN) 2 % Apply 1 application topically 2 (two) times daily. 05/15/17   Belinda FisherYu,  V, PA-C    Family History Family History  Problem Relation Age of Onset  . Hypertension Mother   . Heart failure Mother   . Diabetes Mother     Social History Social History   Tobacco Use  . Smoking status: Current Every Day Smoker    Packs/day: 0.50    Types: Cigarettes  . Smokeless  tobacco: Never Used  Substance Use Topics  . Alcohol use: Yes    Comment: 6 pack week  . Drug use: No     Allergies   Patient has no known allergies.   Review of Systems Review of Systems  Reason unable to perform ROS: See HPI as above.     Physical Exam Triage Vital Signs ED Triage Vitals  Enc Vitals Group     BP 05/15/17 1726 (!) 147/85     Pulse Rate 05/15/17 1726 93     Resp 05/15/17 1726 14     Temp 05/15/17 1726 97.7 F (36.5 C)     Temp src --      SpO2 05/15/17 1726 97 %     Weight --      Height --      Head Circumference --      Peak Flow --      Pain Score 05/15/17 1727 10     Pain Loc --      Pain Edu? --      Excl. in GC? --    No data found.  Updated Vital Signs BP (!) 147/85   Pulse 93   Temp 97.7 F (36.5 C)   Resp 14   SpO2 97%   Physical Exam  Constitutional: He  is oriented to person, place, and time. He appears well-developed and well-nourished. No distress.  HENT:  Head: Normocephalic and atraumatic.  Eyes: Conjunctivae are normal. Pupils are equal, round, and reactive to light.  Musculoskeletal:  See picture below, 0.5 cm abscess near the webspace of the fourth and fifth right toe, with surrounding erythema.  Slight increase in warmth.  Tenderness to palpation.  Full range of motion of toes and ankle.  Strength normal and equal bilaterally.  Sensation intact and equal bilaterally.  Pedal pulses 2+ and equal bilaterally.  Cap refill less than 2 seconds.  Neurological: He is alert and oriented to person, place, and time.         UC Treatments / Results  Labs (all labs ordered are listed, but only abnormal results are displayed) Labs Reviewed  AEROBIC CULTURE (SUPERFICIAL SPECIMEN)    EKG  EKG Interpretation None       Radiology No results found.  Procedures Incision and Drainage Date/Time: 05/15/2017 6:07 PM Performed by: Belinda Fisher, PA-C Authorized by: Elvina Sidle, MD   Consent:    Consent obtained:   Verbal   Risks discussed:  Bleeding, incomplete drainage, infection and pain   Alternatives discussed:  Alternative treatment Location:    Type:  Abscess   Size:  0.5   Location:  Lower extremity   Lower extremity location:  Foot   Foot location:  R foot Pre-procedure details:    Skin preparation:  Chloraprep Anesthesia (see MAR for exact dosages):    Anesthesia method: freezing spray. Procedure type:    Complexity:  Simple Procedure details:    Needle aspiration: no     Incision types:  Stab incision   Scalpel blade:  11   Drainage:  Bloody and purulent   Drainage amount:  Scant   Wound treatment:  Wound left open   Packing materials:  None Post-procedure details:    Patient tolerance of procedure:  Tolerated well, no immediate complications   (including critical care time)  Medications Ordered in UC Medications - No data to display   Initial Impression / Assessment and Plan / UC Course  I have reviewed the triage vital signs and the nursing notes.  Pertinent labs & imaging results that were available during my care of the patient were reviewed by me and considered in my medical decision making (see chart for details).    Patient tolerated I&D well.  Start Keflex for surrounding cellulitis.  Bactroban ointment on affected area.  Wound care instructions given.  Wound culture obtained.  Return precautions given.  Patient expresses understanding and agrees to plan.  Final Clinical Impressions(s) / UC Diagnoses   Final diagnoses:  Abscess of foot  Cellulitis of right lower extremity    ED Discharge Orders        Ordered    cephALEXin (KEFLEX) 500 MG capsule  4 times daily     05/15/17 1801    meloxicam (MOBIC) 7.5 MG tablet  Daily     05/15/17 1801    mupirocin ointment (BACTROBAN) 2 %  2 times daily     05/15/17 1801        Belinda Fisher, PA-C 05/15/17 1809

## 2017-05-15 NOTE — Discharge Instructions (Signed)
Abscess drained today.  Start Keflex as directed for skin infection.  Bactroban ointment on affected area.  Daily dressing, keep area clean and dry.  Mobic for pain and inflammation.  Monitor for any spreading redness, increased warmth, fever, follow-up for reevaluation.

## 2017-05-15 NOTE — ED Triage Notes (Signed)
Pt c/o some kind of blister on the top of his R foot, noticed it about a week ago.

## 2017-05-17 LAB — AEROBIC CULTURE  (SUPERFICIAL SPECIMEN)

## 2017-05-17 LAB — AEROBIC CULTURE W GRAM STAIN (SUPERFICIAL SPECIMEN)

## 2017-08-17 ENCOUNTER — Encounter (HOSPITAL_COMMUNITY): Payer: Self-pay

## 2017-08-17 ENCOUNTER — Other Ambulatory Visit: Payer: Self-pay

## 2017-08-17 ENCOUNTER — Emergency Department (HOSPITAL_COMMUNITY): Payer: Self-pay

## 2017-08-17 ENCOUNTER — Emergency Department (HOSPITAL_COMMUNITY)
Admission: EM | Admit: 2017-08-17 | Discharge: 2017-08-17 | Disposition: A | Discharge status: Home / Self Care | Payer: Self-pay | Attending: Emergency Medicine | Admitting: Emergency Medicine

## 2017-08-17 DIAGNOSIS — M25561 Pain in right knee: Secondary | ICD-10-CM | POA: Insufficient documentation

## 2017-08-17 DIAGNOSIS — F17210 Nicotine dependence, cigarettes, uncomplicated: Secondary | ICD-10-CM | POA: Insufficient documentation

## 2017-08-17 MED ORDER — HYDROCODONE-ACETAMINOPHEN 5-325 MG PO TABS: 1.0000 | ORAL_TABLET | Freq: Four times a day (QID) | ORAL | 0 refills | Status: DC | PRN

## 2017-08-17 MED ORDER — PREDNISONE 20 MG PO TABS: 40.0000 mg | ORAL_TABLET | Freq: Every day | ORAL | 0 refills | Status: DC

## 2017-08-17 MED ORDER — HYDROCODONE-ACETAMINOPHEN 5-325 MG PO TABS: 2.0000 | ORAL_TABLET | ORAL | 0 refills | Status: DC | PRN

## 2017-08-17 NOTE — ED Triage Notes (Signed)
Patient c/o right knee pain x 1 week. Patient states he has increased pain with weight bearing.

## 2017-08-17 NOTE — ED Provider Notes (Signed)
[facility]-EMERGENCY DEPT Provider Note   CSN: [id number] Arrival date & time: 08/17/17  4098     History   Chief Complaint Chief Complaint  Patient presents with  . Knee Pain    HPI Drew Cooper is a 52 y.o. male.  HPI Patient presents with right knee pain.  Has had for the last week and a half.  No injury.  No known trauma.  It is on the middle side of his right knee.  Worse with walking.  Does have some swelling in the knee.  No fevers.  No trouble with the knee before this.  No real relief with ibuprofen. History reviewed. No pertinent past medical history.  There are no active problems to display for this patient.   Past Surgical History:  Procedure Laterality Date  . KNEE SURGERY          Home Medications    Prior to Admission medications   Medication Sig Start Date End Date Taking? Authorizing Provider  ibuprofen (ADVIL,MOTRIN) 200 MG tablet Take 400 mg by mouth every 6 (six) hours as needed (pain).   Yes [provider]  ibuprofen (ADVIL,MOTRIN) 800 MG tablet Take 1 tablet (800 mg total) by mouth every 8 (eight) hours as needed. 05/03/13  Yes Lawyer, Cristal Deer, PA-C  cephALEXin (KEFLEX) 500 MG capsule Take 1 capsule (500 mg total) by mouth 4 (four) times daily. 05/15/17   Cathie Hoops, Amy V, PA-C  HYDROcodone-acetaminophen (NORCO/VICODIN) 5-325 MG tablet Take 1-2 tablets by mouth every 6 (six) hours as needed. 08/17/17   Benjiman Core, MD  meloxicam (MOBIC) 7.5 MG tablet Take 1 tablet (7.5 mg total) by mouth daily. 05/15/17   Cathie Hoops, Amy V, PA-C  mupirocin ointment (BACTROBAN) 2 % Apply 1 application topically 2 (two) times daily. 05/15/17   Cathie Hoops, Amy V, PA-C  predniSONE (DELTASONE) 20 MG tablet Take 2 tablets (40 mg total) by mouth daily. 08/17/17   Benjiman Core, MD    Family History Family History  Problem Relation Age of Onset  . Hypertension Mother   . Heart failure Mother   . Diabetes Mother     Social History Social  History   Tobacco Use  . Smoking status: Current Every Day Smoker    Packs/day: 0.50    Types: Cigarettes  . Smokeless tobacco: Never Used  Substance Use Topics  . Alcohol use: Yes    Comment: 6 pack week  . Drug use: No     Allergies   Patient has no known allergies.   Review of Systems Review of Systems  Constitutional: Negative for chills and fever.  Gastrointestinal: Negative for abdominal pain.  Musculoskeletal: Negative for back pain.       Right knee pain  Skin: Negative for wound.  Neurological: Negative for weakness and numbness.     Physical Exam Updated Vital Signs BP (!) 153/98 (BP Location: Right Arm)   Pulse (!) 104   Temp 98.9 F (37.2 C) (Oral)   Resp 18   Ht  (1.778 m)   Wt 99.8 kg (220 lb)   SpO2 96%   BMI 31.57 kg/m   Physical Exam  Constitutional: He appears well-developed.  Musculoskeletal:  Some tenderness over the right knee medially.  Knee stable.  Does have effusion.  Somewhat decreased range of motion due to pain.  Pain with valgus strain on the knee.  Also some pain with twisting of the lower extremity.  No swelling of the lower leg.  Neurovascularly  intact distally.  No tenderness over hip or ankle.  Skin: Capillary refill takes less than 2 seconds.     ED Treatments / Results  Labs (all labs ordered are listed, but only abnormal results are displayed) Labs Reviewed - No data to display  EKG None  Radiology Dg Knee Complete 4 Views Right  Result Date: 08/17/2017 CLINICAL DATA:  Right knee pain following twisting injury 1 week ago, initial encounter EXAM: RIGHT KNEE - COMPLETE 4+ VIEW COMPARISON:  None. FINDINGS: Mild degenerative changes are noted most marked in the medial joint space. No acute fracture or dislocation is noted. No joint effusion is seen. IMPRESSION: Mild degenerative change without acute abnormality. Electronically Signed   By: Alcide Clever M.D.   On: 08/17/2017 09:07    Procedures Procedures  (including critical care time)  Medications Ordered in ED Medications - No data to display   Initial Impression / Assessment and Plan / ED Course  I have reviewed the triage vital signs and the nursing notes.  Pertinent labs & imaging results that were available during my care of the patient were reviewed by me and considered in my medical decision making (see chart for details).     Patient with knee pain.  X-ray nonspecific.  Doubt infection.  Immobilizer given for comfort.  We will follow-up with Dr. Devonne Doughty, who is seen before.  Will discharge home with steroids and pain medicine.  Final Clinical Impressions(s) / ED Diagnoses   Final diagnoses:  Acute pain of right knee    ED Discharge Orders        Ordered    predniSONE (DELTASONE) 20 MG tablet  Daily     08/17/17 1024    HYDROcodone-acetaminophen (NORCO/VICODIN) 5-325 MG tablet  Every 4 hours PRN,   Status:  Discontinued     08/17/17 1025    HYDROcodone-acetaminophen (NORCO/VICODIN) 5-325 MG tablet  Every 6 hours PRN,   Status:  Discontinued     08/17/17 1025    HYDROcodone-acetaminophen (NORCO/VICODIN) 5-325 MG tablet  Every 6 hours PRN     08/17/17 1027       Benjiman Core, MD 08/17/17 1631

## 2017-08-17 NOTE — Discharge Instructions (Addendum)
Follow-up with Dr. Ranell Patrick as needed for your knee pain.

## 2018-05-07 ENCOUNTER — Encounter: Payer: Self-pay | Admitting: Emergency Medicine

## 2018-05-07 ENCOUNTER — Ambulatory Visit
Admission: EM | Admit: 2018-05-07 | Discharge: 2018-05-07 | Disposition: A | Discharge status: Home / Self Care | Payer: BLUE CROSS/BLUE SHIELD | Attending: Family Medicine | Admitting: Family Medicine

## 2018-05-07 DIAGNOSIS — J340 Abscess, furuncle and carbuncle of nose: Secondary | ICD-10-CM

## 2018-05-07 MED ORDER — DOXYCYCLINE HYCLATE 100 MG PO CAPS: 100.0000 mg | ORAL_CAPSULE | Freq: Two times a day (BID) | ORAL | 0 refills | Status: AC

## 2018-05-07 MED ORDER — MUPIROCIN 2 % EX OINT: 1.0000 "application " | TOPICAL_OINTMENT | Freq: Two times a day (BID) | CUTANEOUS | 0 refills | Status: DC

## 2018-05-07 NOTE — ED Triage Notes (Addendum)
Pt presents to [facility] for assessment of growth/infection above his right upper lip, below his nostril.  Denies fevers.  C/o pus-like drainage.

## 2018-05-07 NOTE — Discharge Instructions (Signed)
Please begin taking doxycycline twice daily for the next 10 days Apply Bactroban ointment to this area twice daily Warm compresses for approximately 10 to 15 minutes multiple times a day  Please follow-up if symptoms not resolving or worsening, developing increased pain, swelling, fevers

## 2018-05-07 NOTE — ED Provider Notes (Signed)
[facility]    CSN: [id number] Arrival date & time: 05/07/18  1357     History   Chief Complaint Chief Complaint  Patient presents with  . Cellulitis    HPI Drew Cooper is a 53 y.o. male no significant past medical history presenting today for evaluation of possible lip infection.  Patient states that over the past 5 days he has had increased redness swelling and pain above his lip under his nose.  He is also noticed a puslike drainage from it.  Denies history of similar.  Does note that he will occasionally trim his beard with a razor.  Denies any fevers.  HPI  History reviewed. No pertinent past medical history.  There are no active problems to display for this patient.   Past Surgical History:  Procedure Laterality Date  . KNEE SURGERY         Home Medications    Prior to Admission medications   Medication Sig Start Date End Date Taking? Authorizing Provider  doxycycline (VIBRAMYCIN) 100 MG capsule Take 1 capsule (100 mg total) by mouth 2 (two) times daily for 10 days. 05/07/18 05/17/18  Wieters, Hallie C, PA-C  ibuprofen (ADVIL,MOTRIN) 200 MG tablet Take 400 mg by mouth every 6 (six) hours as needed (pain).    [provider]  ibuprofen (ADVIL,MOTRIN) 800 MG tablet Take 1 tablet (800 mg total) by mouth every 8 (eight) hours as needed. 05/03/13   Lawyer, Cristal Deer, PA-C  mupirocin ointment (BACTROBAN) 2 % Apply 1 application topically 2 (two) times daily. 05/07/18   Wieters, Junius Creamer, PA-C    Family History Family History  Problem Relation Age of Onset  . Hypertension Mother   . Heart failure Mother   . Diabetes Mother     Social History Social History   Tobacco Use  . Smoking status: Current Every Day Smoker    Packs/day: 0.50    Types: Cigarettes  . Smokeless tobacco: Never Used  Substance Use Topics  . Alcohol use: Yes    Comment: 6 pack week  . Drug use: No     Allergies   Patient has no known allergies.   Review  of Systems Review of Systems  Constitutional: Negative for fatigue and fever.  Eyes: Negative for redness, itching and visual disturbance.  Respiratory: Negative for shortness of breath.   Cardiovascular: Negative for chest pain and leg swelling.  Gastrointestinal: Negative for nausea and vomiting.  Musculoskeletal: Negative for arthralgias and myalgias.  Skin: Positive for color change. Negative for rash and wound.  Neurological: Negative for dizziness, syncope, weakness, light-headedness and headaches.     Physical Exam Triage Vital Signs ED Triage Vitals [05/07/18 1408]  Enc Vitals Group     BP (!) 145/95     Pulse Rate (!) 105     Resp 16     Temp 98 F (36.7 C)     Temp Source Oral     SpO2 97 %     Weight      Height      Head Circumference      Peak Flow      Pain Score 9     Pain Loc      Pain Edu?      Excl. in GC?    No data found.  Updated Vital Signs BP (!) 145/95 (BP Location: Left Arm)   Pulse (!) 105   Temp 98 F (36.7 C) (Oral)   Resp 16  SpO2 97%   Visual Acuity Right Eye Distance:   Left Eye Distance:   Bilateral Distance:    Right Eye Near:   Left Eye Near:    Bilateral Near:     Physical Exam Vitals signs and nursing note reviewed.  Constitutional:      Appearance: He is well-developed.     Comments: No acute distress  HENT:     Head: Normocephalic and atraumatic.     Nose: Nose normal.     Mouth/Throat:     Comments: Mild upper lip swelling  Oral mucosa pink and moist, no tonsillar enlargement or exudate. Posterior pharynx patent and nonerythematous, no uvula deviation or swelling. Normal phonation. Eyes:     Conjunctiva/sclera: Conjunctivae normal.  Neck:     Musculoskeletal: Neck supple.  Cardiovascular:     Rate and Rhythm: Normal rate.  Pulmonary:     Effort: Pulmonary effort is normal. No respiratory distress.  Abdominal:     General: There is no distension.  Musculoskeletal: Normal range of motion.  Skin:     General: Skin is warm and dry.     Comments: Right superior aspect of the lip with erythema, induration, central area below right nares with scabbing and crusting No fluctuance palpated  Neurological:     Mental Status: He is alert and oriented to person, place, and time.      UC Treatments / Results  Labs (all labs ordered are listed, but only abnormal results are displayed) Labs Reviewed - No data to display  EKG None  Radiology No results found.  Procedures Procedures (including critical care time)  Medications Ordered in UC Medications - No data to display  Initial Impression / Assessment and Plan / UC Course  I have reviewed the triage vital signs and the nursing notes.  Pertinent labs & imaging results that were available during my care of the patient were reviewed by me and considered in my medical decision making (see chart for details).     Appears to have abscess/cellulitis to right nasal area, will treat with doxycycline as well as Bactroban, warm compresses.  Continue to monitor for gradual resolution,Discussed strict return precautions. Patient verbalized understanding and is agreeable with plan.  Final Clinical Impressions(s) / UC Diagnoses   Final diagnoses:  Nasal abscess     Discharge Instructions     Please begin taking doxycycline twice daily for the next 10 days Apply Bactroban ointment to this area twice daily Warm compresses for approximately 10 to 15 minutes multiple times a day  Please follow-up if symptoms not resolving or worsening, developing increased pain, swelling, fevers   ED Prescriptions    Medication Sig Dispense Auth. Provider   doxycycline (VIBRAMYCIN) 100 MG capsule Take 1 capsule (100 mg total) by mouth 2 (two) times daily for 10 days. 20 capsule Wieters, Hallie C, PA-C   mupirocin ointment (BACTROBAN) 2 % Apply 1 application topically 2 (two) times daily. 30 g Wieters, [place] C, PA-C     Controlled Substance  Prescriptions [place] Controlled Substance Registry consulted? Not Applicable   Lew DawesWieters, Hallie C, [place]PA-C 05/07/18 1435

## 2018-05-07 NOTE — ED Notes (Signed)
Patient able to ambulate independently  

## 2019-01-16 IMAGING — CR DG KNEE COMPLETE 4+V*R*
4 series · 4 of 4 positions shown · non-contrast
Comparison: None.

CLINICAL DATA: Right knee pain following twisting injury 1 week
ago, initial encounter

EXAM:
RIGHT KNEE - COMPLETE 4+ VIEW

[x knee ap right (1 of 3)]
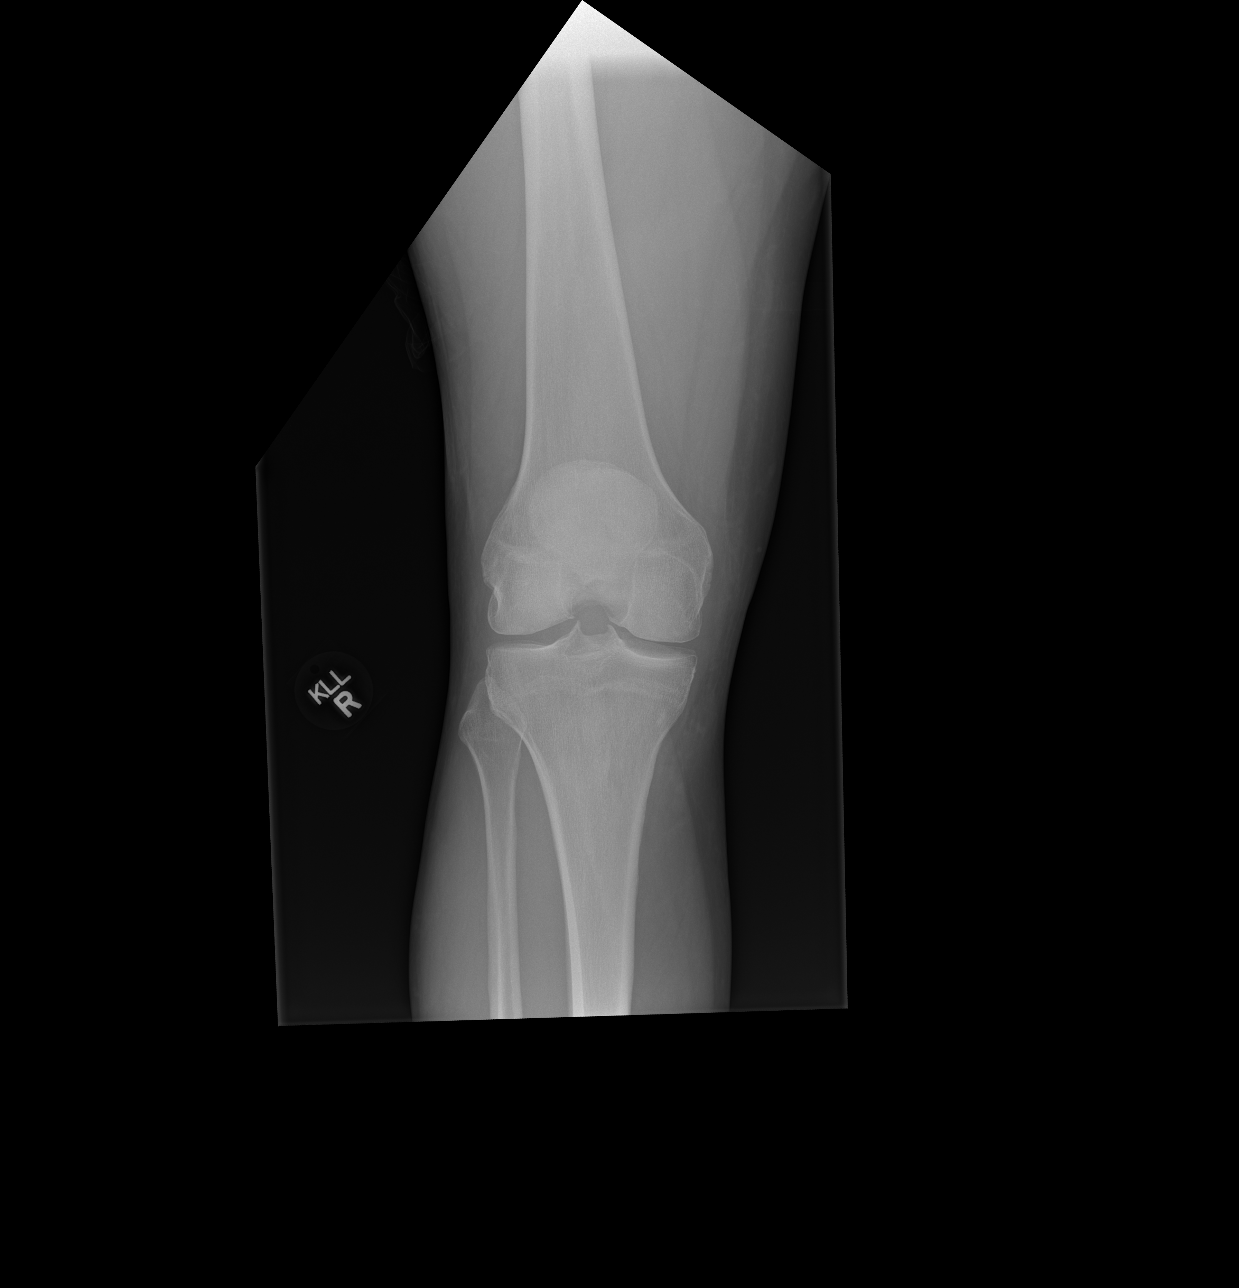

[x knee ap right (2 of 3)]
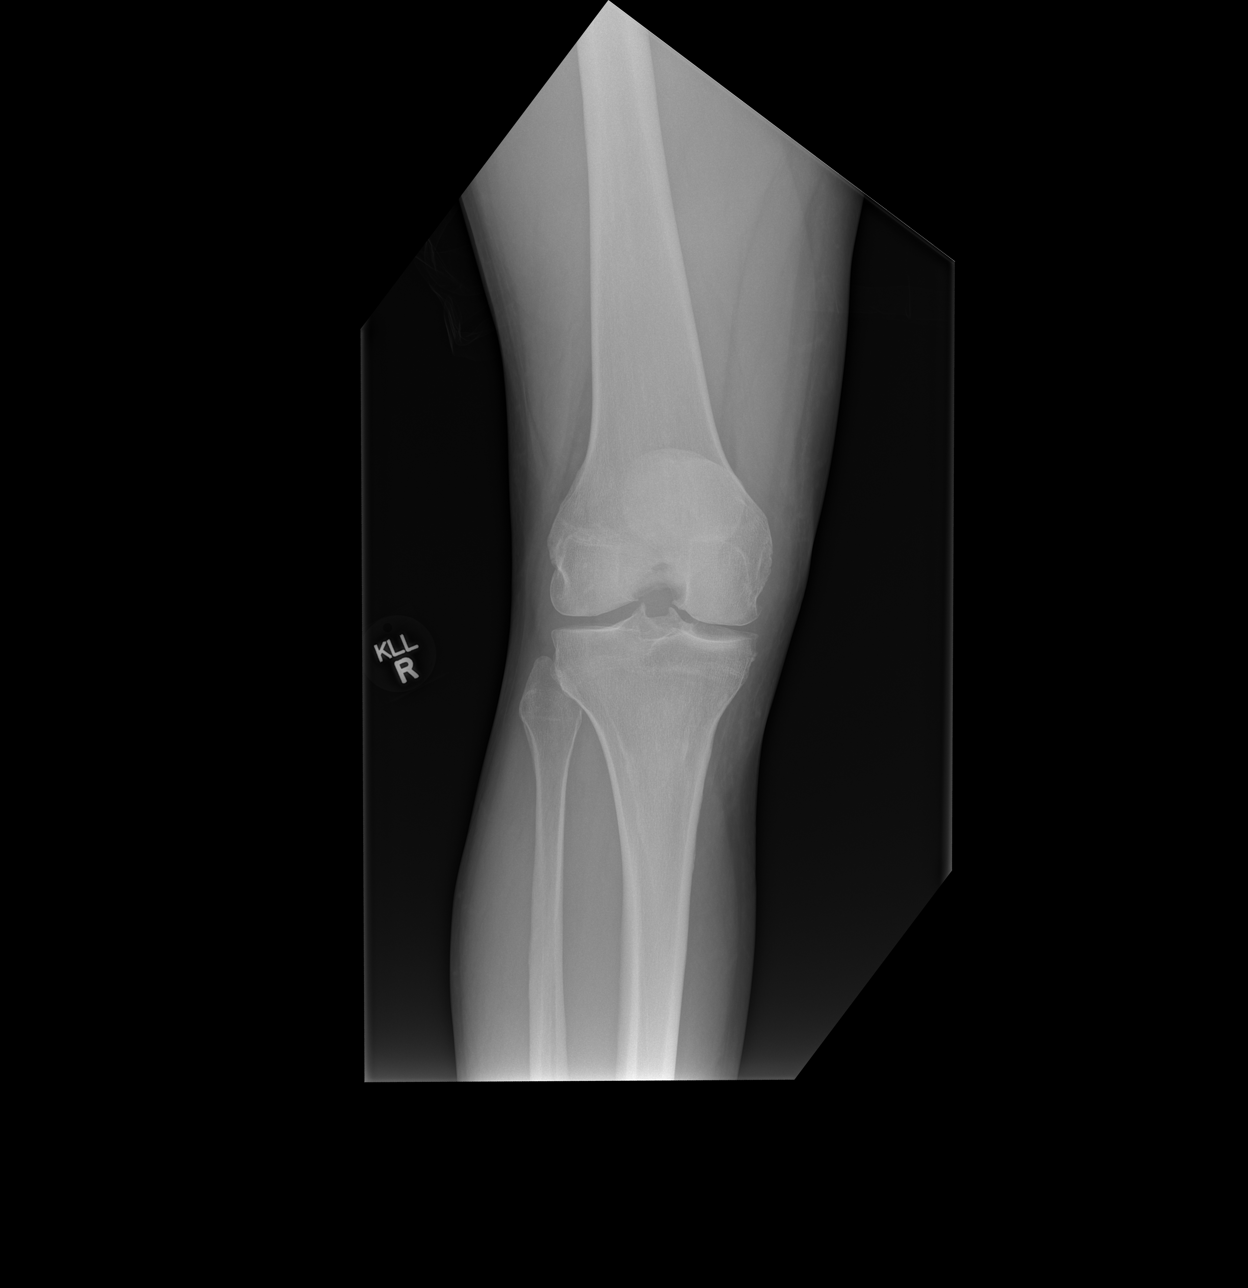

[x knee ap right (3 of 3)]
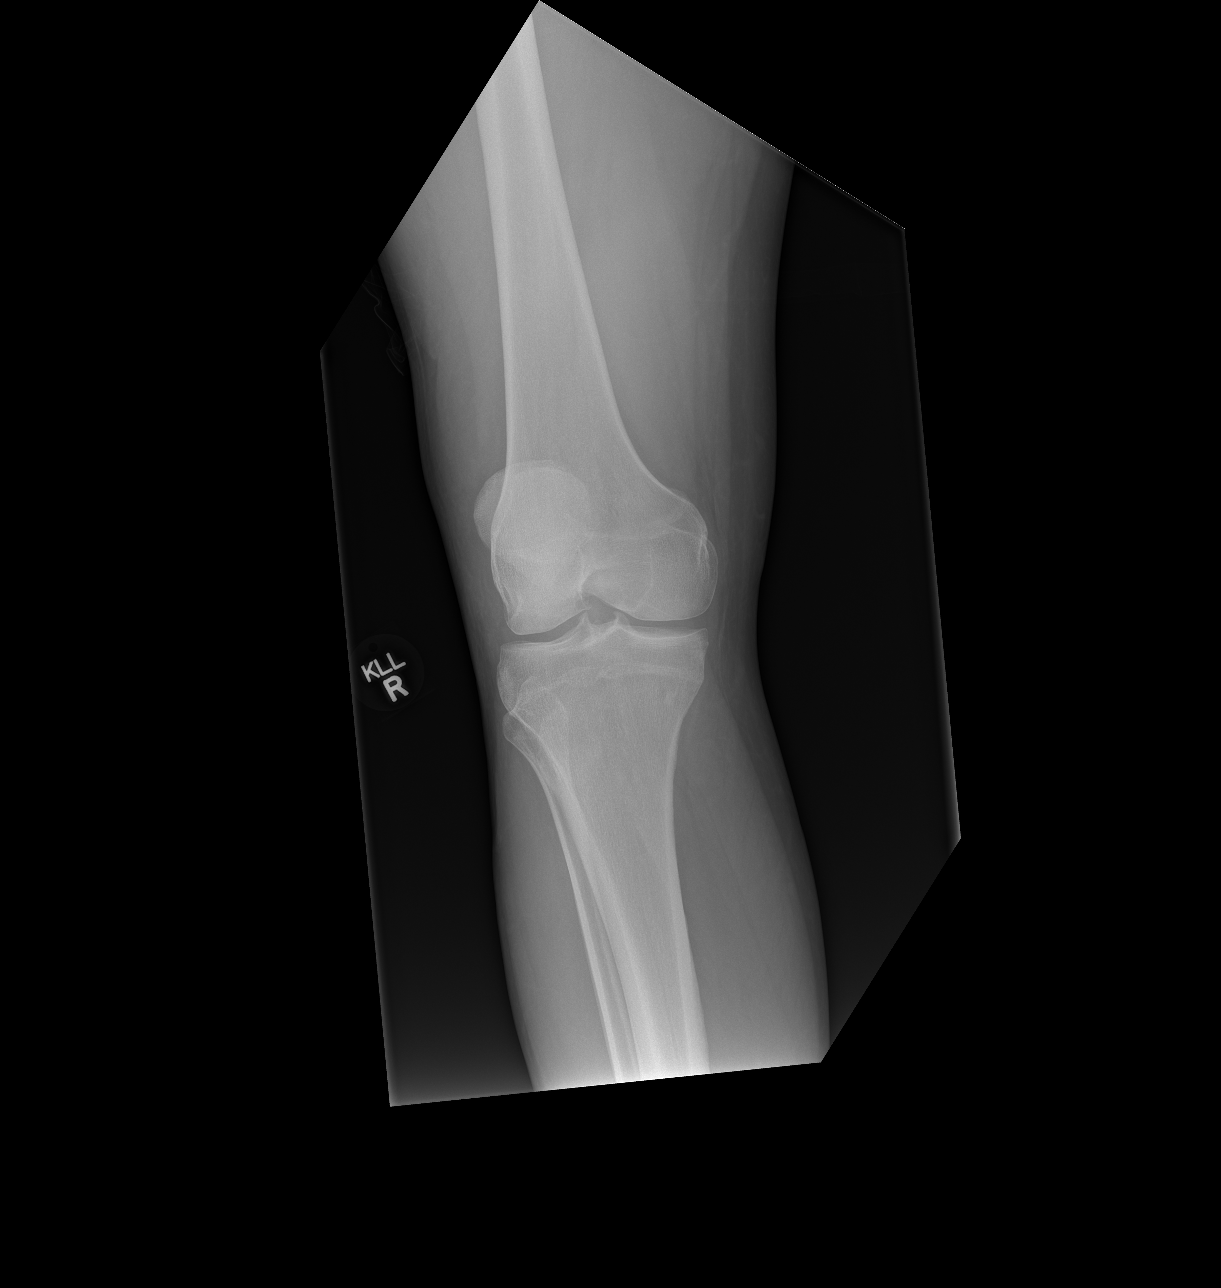

[x knee lat right]
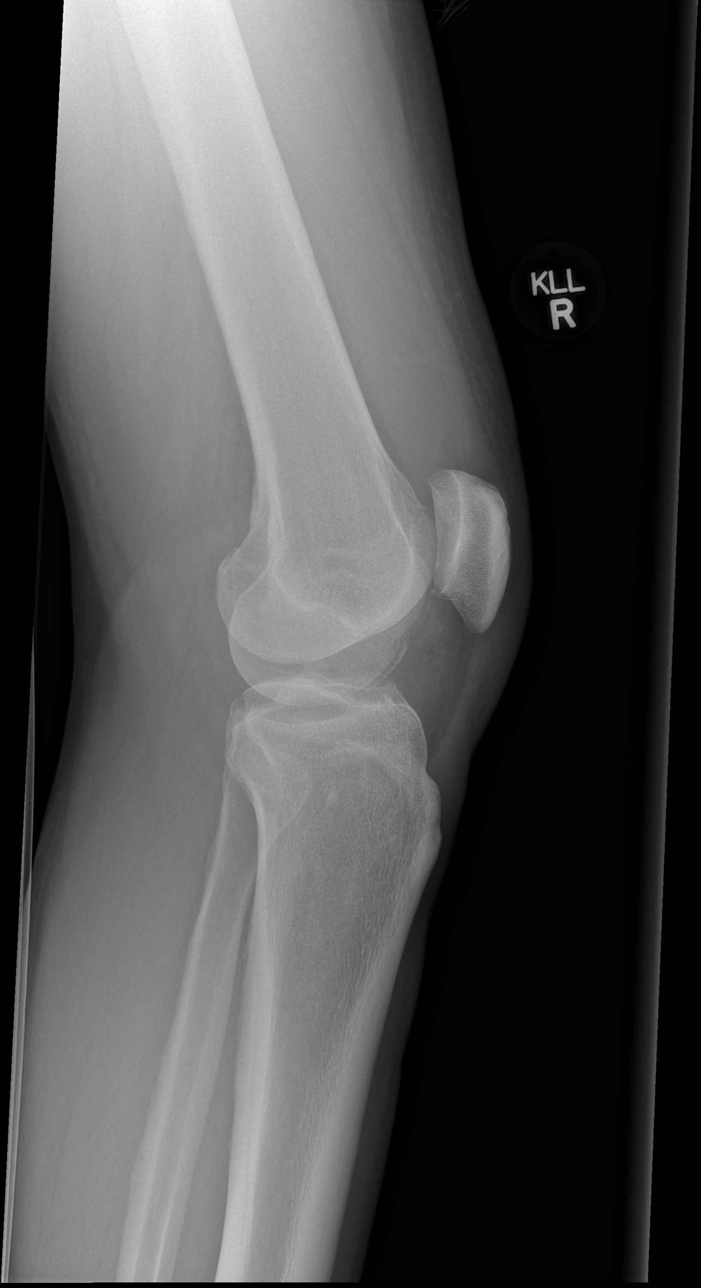

[4 of 4 positions shown; findings below may reference images not displayed]

FINDINGS: Mild degenerative changes are noted most marked in the medial joint
space. No acute fracture or dislocation is noted. No joint effusion
is seen.
IMPRESSION: Mild degenerative change without acute abnormality.

## 2022-03-08 ENCOUNTER — Emergency Department (HOSPITAL_COMMUNITY)
Admission: EM | Admit: 2022-03-08 | Discharge: 2022-03-08 | Disposition: A | Discharge status: Home / Self Care | Payer: BC Managed Care – PPO | Attending: Emergency Medicine | Admitting: Emergency Medicine

## 2022-03-08 ENCOUNTER — Emergency Department (HOSPITAL_COMMUNITY): Payer: BC Managed Care – PPO

## 2022-03-08 DIAGNOSIS — F17210 Nicotine dependence, cigarettes, uncomplicated: Secondary | ICD-10-CM | POA: Diagnosis not present

## 2022-03-08 DIAGNOSIS — R1032 Left lower quadrant pain: Secondary | ICD-10-CM | POA: Diagnosis present

## 2022-03-08 DIAGNOSIS — R3129 Other microscopic hematuria: Secondary | ICD-10-CM | POA: Insufficient documentation

## 2022-03-08 DIAGNOSIS — R7989 Other specified abnormal findings of blood chemistry: Secondary | ICD-10-CM | POA: Diagnosis not present

## 2022-03-08 DIAGNOSIS — N1330 Unspecified hydronephrosis: Secondary | ICD-10-CM | POA: Insufficient documentation

## 2022-03-08 DIAGNOSIS — D649 Anemia, unspecified: Secondary | ICD-10-CM | POA: Diagnosis not present

## 2022-03-08 DIAGNOSIS — R030 Elevated blood-pressure reading, without diagnosis of hypertension: Secondary | ICD-10-CM | POA: Diagnosis not present

## 2022-03-08 DIAGNOSIS — R112 Nausea with vomiting, unspecified: Secondary | ICD-10-CM | POA: Insufficient documentation

## 2022-03-08 LAB — COMPREHENSIVE METABOLIC PANEL
ALT: 21 U/L (ref 0–44)
AST: 33 U/L (ref 15–41)
Albumin: 3.6 g/dL (ref 3.5–5.0)
Alkaline Phosphatase: 78 U/L (ref 38–126)
Anion gap: 14 (ref 5–15)
BUN: 17 mg/dL (ref 6–20)
CO2: 21 mmol/L — ABNORMAL LOW (ref 22–32)
Calcium: 10.1 mg/dL (ref 8.9–10.3)
Chloride: 105 mmol/L (ref 98–111)
Creatinine, Ser: 2.21 mg/dL — ABNORMAL HIGH (ref 0.61–1.24)
GFR, Estimated: 34 mL/min — ABNORMAL LOW (ref >60)
Glucose, Bld: 135 mg/dL — ABNORMAL HIGH (ref 70–99)
Potassium: 3.7 mmol/L (ref 3.5–5.1)
Sodium: 140 mmol/L (ref 135–145)
Total Bilirubin: 0.4 mg/dL (ref 0.3–1.2)
Total Protein: 6.9 g/dL (ref 6.5–8.1)

## 2022-03-08 LAB — CBC
HCT: 33.9 % — ABNORMAL LOW (ref 39.0–52.0)
Hemoglobin: 11.7 g/dL — ABNORMAL LOW (ref 13.0–17.0)
MCH: 33.1 pg (ref 26.0–34.0)
MCHC: 34.5 g/dL (ref 30.0–36.0)
MCV: 95.8 fL (ref 80.0–100.0)
Platelets: 285 10*3/uL (ref 150–400)
RBC: 3.54 MIL/uL — ABNORMAL LOW (ref 4.22–5.81)
RDW: 13.2 % (ref 11.5–15.5)
WBC: 15.7 10*3/uL — ABNORMAL HIGH (ref 4.0–10.5)
nRBC: 0 % (ref 0.0–0.2)

## 2022-03-08 LAB — URINALYSIS, ROUTINE W REFLEX MICROSCOPIC
Bilirubin Urine: NEGATIVE
Glucose, UA: NEGATIVE mg/dL
Ketones, ur: NEGATIVE mg/dL
Leukocytes,Ua: NEGATIVE
Nitrite: NEGATIVE
Protein, ur: 100 mg/dL — AB
Specific Gravity, Urine: 1.025 (ref 1.005–1.030)
pH: 5 (ref 5.0–8.0)

## 2022-03-08 LAB — LIPASE, BLOOD: Lipase: 43 U/L (ref 11–51)

## 2022-03-08 MED ORDER — HYDROMORPHONE HCL 1 MG/ML IJ SOLN
1.0000 mg | Freq: Once | INTRAMUSCULAR | Status: AC
  Administered 2022-03-08: 1 mg via INTRAVENOUS
  Filled 2022-03-08: qty 1

## 2022-03-08 MED ORDER — ONDANSETRON HCL 4 MG/2ML IJ SOLN
4.0000 mg | Freq: Once | INTRAMUSCULAR | Status: AC
  Administered 2022-03-08: 4 mg via INTRAVENOUS
  Filled 2022-03-08: qty 2

## 2022-03-08 MED ORDER — LACTATED RINGERS IV BOLUS
1000.0000 mL | Freq: Once | INTRAVENOUS | Status: AC
  Administered 2022-03-08: 1000 mL via INTRAVENOUS

## 2022-03-08 NOTE — Discharge Instructions (Signed)
Your imaging studies are suggestive of a recently passed kidney stone.  There are some possible areas of bladder wall thickening in this could also be concerning for cancerous tissue.  You should follow-up with the urologist for reassessment.  Your lab work showed decreased kidney function.  It is unclear if this is a chronic condition.  You should continue to stay well-hydrated and have your kidney function rechecked sometime later this week.  This is a simple lab test.  In the meantime, avoid NSAID medications.  This includes Advil, Aleve, Naprosyn, Goody powders, etc.  Check your blood pressures at home.  If your blood pressures are consistently elevated, you would benefit from blood pressure medication.  Return to the emergency department for any new or worsening symptoms of concern.

## 2022-03-08 NOTE — ED Triage Notes (Signed)
Patient here for evaluation of LLQ abdominal pain that started this morning at approximately 0800. Reports an episode of vomiting earlier but denies nausea, denies diarrhea. Denies history of abdominal surgeries.

## 2022-03-08 NOTE — ED Provider Notes (Signed)
[facility] DEPARTMENT Provider Note   CSN: [id number] Arrival date & time: 03/08/22  1303     History  Chief Complaint  Patient presents with   Abdominal Pain    Drew Cooper is a 56 y.o. male.   Abdominal Pain Associated symptoms: nausea and vomiting   Patient is for abdominal pain.  He has no known chronic medical conditions.  He does smoke cigarettes.  This morning, he woke up in his normal state of health.  He did eat some breakfast which was the normal food that he eats in the mornings.  He subsequently had a left lower quadrant abdominal pain.  Onset was sudden and severe.  It has been persistent since that time.  Currently it is 10/10 in severity.  He had associated nausea and vomiting.  He does endorse ongoing nausea.  He denies any other areas of discomfort.  He denies any recent urinary symptoms.  He denies any testicular pain or swelling.  He had a bowel movement this morning which was normal in consistency and color.     Home Medications Prior to Admission medications   Medication Sig Start Date End Date Taking? Authorizing Provider  ibuprofen (ADVIL,MOTRIN) 200 MG tablet Take 400 mg by mouth every 6 (six) hours as needed (pain).    [provider]  ibuprofen (ADVIL,MOTRIN) 800 MG tablet Take 1 tablet (800 mg total) by mouth every 8 (eight) hours as needed. 05/03/13   Lawyer, Harrell Gave, PA-C  mupirocin ointment (BACTROBAN) 2 % Apply 1 application topically 2 (two) times daily. 05/07/18   Wieters, Hallie C, PA-C      Allergies    Patient has no known allergies.    Review of Systems   Review of Systems  Gastrointestinal:  Positive for abdominal pain, nausea and vomiting.  All other systems reviewed and are negative.   Physical Exam Updated Vital Signs BP (!) 150/81   Pulse 64   Temp 97.7 F (36.5 C)   Resp 18   SpO2 99%  Physical Exam Vitals and nursing note reviewed.  Constitutional:      General: He is not in  acute distress.    Appearance: He is well-developed and normal weight. He is not ill-appearing, toxic-appearing or diaphoretic.  HENT:     Head: Normocephalic and atraumatic.     Mouth/Throat:     Mouth: Mucous membranes are moist.     Pharynx: Oropharynx is clear.  Eyes:     General: No scleral icterus.    Extraocular Movements: Extraocular movements intact.     Conjunctiva/sclera: Conjunctivae normal.  Cardiovascular:     Rate and Rhythm: Normal rate and regular rhythm.  Pulmonary:     Effort: Pulmonary effort is normal. No respiratory distress.  Abdominal:     Palpations: Abdomen is soft.     Tenderness: There is abdominal tenderness in the left lower quadrant. There is no guarding or rebound.  Genitourinary:    Testes: Normal. Cremasteric reflex is present.        Right: Tenderness or swelling not present.        Left: Tenderness or swelling not present.  Musculoskeletal:        General: No swelling.     Cervical back: Neck supple.  Skin:    General: Skin is warm and dry.     Capillary Refill: Capillary refill takes less than 2 seconds.  Neurological:     General: No focal deficit present.  Mental Status: He is alert and oriented to person, place, and time.  Psychiatric:        Mood and Affect: Mood normal.        Behavior: Behavior normal.     ED Results / Procedures / Treatments   Labs (all labs ordered are listed, but only abnormal results are displayed) Labs Reviewed  COMPREHENSIVE METABOLIC PANEL - Abnormal; Notable for the following components:      Result Value   CO2 21 (*)    Glucose, Bld 135 (*)    Creatinine, Ser 2.21 (*)    GFR, Estimated 34 (*)    All other components within normal limits  CBC - Abnormal; Notable for the following components:   WBC 15.7 (*)    RBC 3.54 (*)    Hemoglobin 11.7 (*)    HCT 33.9 (*)    All other components within normal limits  URINALYSIS, ROUTINE W REFLEX MICROSCOPIC - Abnormal; Notable for the following  components:   APPearance HAZY (*)    Hgb urine dipstick MODERATE (*)    Protein, ur 100 (*)    Bacteria, UA RARE (*)    All other components within normal limits  LIPASE, BLOOD    EKG None  Radiology CT ABDOMEN PELVIS WO CONTRAST  Result Date: 03/08/2022 CLINICAL DATA:  Abdominal pain, acute, nonlocalized. Left lower quadrant pain most severe. Vomiting EXAM: CT ABDOMEN AND PELVIS WITHOUT CONTRAST TECHNIQUE: Multidetector CT imaging of the abdomen and pelvis was performed following the standard protocol without IV contrast. RADIATION DOSE REDUCTION: This exam was performed according to the departmental dose-optimization program which includes automated exposure control, adjustment of the mA and/or kV according to patient size and/or use of iterative reconstruction technique. COMPARISON:  None Available. FINDINGS: Lower chest: Lung bases are clear. Hepatobiliary: Liver parenchyma is normal without contrast. No calcified gallstones. Pancreas: Normal Spleen: Normal Adrenals/Urinary Tract: Adrenal glands are normal. The right kidney contains 2 nonobstructing 3 mm stones in lower pole. Punctate calcification towards the upper pole is favored to be vascular. No hydronephrosis on the right. On the left, there is mild swelling of the kidney with mild hydronephrosis. No stone within the kidney. The left ureter is dilated to the bladder. No visible stone. Question mild thickening of the posterior bladder wall on the left. This cannot be stated with certainty given the absence of contrast. Stomach/Bowel: Stomach and small intestine are normal. No abnormal colon finding. No diverticulosis or diverticulitis. Vascular/Lymphatic: Aortic atherosclerosis. No aneurysm. IVC is normal. No adenopathy. Reproductive: Normal Other: No free fluid or air. Musculoskeletal: Ordinary mild spinal degenerative changes. IMPRESSION: 1. Mild swelling of the left kidney with mild hydronephrosis and hydroureter. No visible stone.  Question mild thickening of the posterior bladder wall on the left. This cannot be stated with certainty given the absence of contrast. The differential diagnosis in this case is that of pyelonephritis versus recently passed stone versus low-grade obstruction of the distal ureter due to a bladder wall mass. This could be evaluated further based on urinalysis and clinical features with assistance potentially from ultrasound or contrast enhanced CT. Recently passed stone is the most likely diagnosis. 2. 2 nonobstructing 3 mm stones in the lower pole of the right kidney. 3. Aortic atherosclerosis. Aortic Atherosclerosis (ICD10-I70.0). Electronically Signed   By: Paulina Fusi M.D.   On: 03/08/2022 15:52    Procedures Procedures    Medications Ordered in ED Medications  lactated ringers bolus 1,000 mL (0 mLs Intravenous Stopped 03/08/22 1601)  HYDROmorphone (DILAUDID) injection 1 mg (1 mg Intravenous Given 03/08/22 1406)  ondansetron (ZOFRAN) injection 4 mg (4 mg Intravenous Given 03/08/22 1404)    ED Course/ Medical Decision Making/ A&P                           Medical Decision Making Amount and/or Complexity of Data Reviewed Labs: ordered. Radiology: ordered.  Risk Prescription drug management.   This patient presents to the ED for concern of left lower quadrant abdominal pain, this involves an extensive number of treatment options, and is a complaint that carries with it a high risk of complications and morbidity.  The differential diagnosis includes nephrolithiasis, diverticulitis, testicular torsion, constipation   Co morbidities that complicate the patient evaluation  None known   Additional history obtained:  Additional history obtained from patient's wife External records from outside source obtained and reviewed including EMR   Lab Tests:  I Ordered, and personally interpreted labs.  The pertinent results include: Creatinine is elevated higher than expected.  Chronicity of  this is unknown.  Only lab work for comparison is from 8 years ago.  He also has a anemia of unknown chronicity.  WBC is elevated.  Urinalysis shows microscopic hematuria without evidence of infection.   Imaging Studies ordered:  I ordered imaging studies including CT of abdomen and pelvis I independently visualized and interpreted imaging which showed left-sided hydroureter and hydronephrosis consistent with recently passed kidney stone I agree with the radiologist interpretation   Cardiac Monitoring: / EKG:  The patient was maintained on a cardiac monitor.  I personally viewed and interpreted the cardiac monitored which showed an underlying rhythm of: Mild left hydronephrosis and hydroureter without visible stone, suggestive of recently passed stone.  Problem List / ED Course / Critical interventions / Medication management  Patient is a left lower quadrant abdominal pain since this morning.  Prior to arrival in the ED, he was evaluated in ED triage.  Vital signs at that time were notable for hypertension.  On assessment, patient endorses 10/10 severity pain and continued nausea.  IV fluids were ordered.  Dilaudid and Zofran were ordered for symptomatic relief.  He denies any recent testicular or urinary symptoms.  On urinalysis, there is no pyuria.  There is microscopic hematuria concerning for possible nephrolithiasis.  Lab work is notable for elevation in CK and anemia.  The chronicity of these are unknown, given that only lab work for comparison is from 8 years ago.  I spoke with the patient about this.  Although his blood pressure was elevated on arrival, did normalize while in the ED.  Patient may have undiagnosed hypertension contributing to chronic kidney disease.  Presence of anemia is suggestive of chronic kidney disease.  Alternatively, patient may have AKI from obstructive uropathy.  Patient would benefit from close follow-up for repeat lab work.  On CT imaging, there is mild left  hydronephrosis and hydroureter without presence of a stone.  This is suggestive of recently passed stone.  There was a questionable posterior bladder wall thickening on the left, concerning for possible urothelial carcinoma.  Patient was informed of this as well and advised to follow-up with urology for reassessment.  Given the acuity of his pain, I do suspect a kidney stone that has since passed.  He did have resolution of his pain while in the ED.  Patient was given contact information for urology follow-up.  He was advised to stay well-hydrated.  He  was also advised to discontinue NSAID medications and to have his creatinine rechecked.  Patient is stable for discharge at this time. I ordered medication including Dilaudid for nausea; Zofran for nausea; IV fluids for hydration Reevaluation of the patient after these medicines showed that the patient improved I have reviewed the patients home medicines and have made adjustments as needed   Social Determinants of Health:  Does not have a PCP        Final Clinical Impression(s) / ED Diagnoses Final diagnoses:  Left lower quadrant abdominal pain  Elevated serum creatinine    Rx / DC Orders ED Discharge Orders     None         Godfrey Pick, MD 03/08/22 1629

## 2022-03-08 NOTE — ED Provider Triage Note (Signed)
Emergency Medicine Provider Triage Evaluation Note  Drew Cooper , a 56 y.o. male  was evaluated in triage.  Pt complains of left lower quadrant abdominal pain that started around 8 AM this morning.  Admits to some vomiting.  No diarrhea.  No previous abdominal operations.  No history of kidney stones.  No urinary symptoms.  Review of Systems  Positive: Abdominal pain Negative: fever  Physical Exam  BP (!) 175/94 (BP Location: Right Arm)   Pulse 72   Temp 97.7 F (36.5 C)   Resp 17   SpO2 100%  Gen:   Awake, no distress   Resp:  Normal effort  MSK:   Moves extremities without difficulty  Other:    Medical Decision Making  Medically screening exam initiated at 1:23 PM.  Appropriate orders placed.  Virgie Dad was informed that the remainder of the evaluation will be completed by another provider, this initial triage assessment does not replace that evaluation, and the importance of remaining in the ED until their evaluation is complete.  Abdominal labs CT abd   Mannie Stabile, PA-C 03/08/22 1326

## 2022-03-08 NOTE — ED Notes (Signed)
Patient transported to CT 

## 2022-10-30 DIAGNOSIS — C801 Malignant (primary) neoplasm, unspecified: Secondary | ICD-10-CM

## 2022-10-30 HISTORY — DX: Malignant (primary) neoplasm, unspecified: C80.1

## 2022-11-08 ENCOUNTER — Ambulatory Visit (HOSPITAL_COMMUNITY): Payer: BC Managed Care – PPO

## 2022-11-08 ENCOUNTER — Other Ambulatory Visit: Payer: Self-pay

## 2022-11-08 ENCOUNTER — Emergency Department (HOSPITAL_COMMUNITY): Payer: BC Managed Care – PPO

## 2022-11-08 ENCOUNTER — Encounter (HOSPITAL_COMMUNITY): Payer: Self-pay

## 2022-11-08 ENCOUNTER — Ambulatory Visit (HOSPITAL_COMMUNITY)
Admission: EM | Admit: 2022-11-08 | Discharge: 2022-11-08 | Disposition: A | Discharge status: Home / Self Care | Payer: BC Managed Care – PPO | Source: Home / Self Care

## 2022-11-08 ENCOUNTER — Inpatient Hospital Stay (HOSPITAL_COMMUNITY)
Admission: EM | Admit: 2022-11-08 | Discharge: 2022-11-15 | DRG: 824 | Disposition: A | Discharge status: Home / Self Care | Payer: BC Managed Care – PPO | Attending: Family Medicine | Admitting: Family Medicine

## 2022-11-08 DIAGNOSIS — D631 Anemia in chronic kidney disease: Secondary | ICD-10-CM

## 2022-11-08 DIAGNOSIS — Z87442 Personal history of urinary calculi: Secondary | ICD-10-CM | POA: Diagnosis not present

## 2022-11-08 DIAGNOSIS — Z77011 Contact with and (suspected) exposure to lead: Secondary | ICD-10-CM | POA: Diagnosis present

## 2022-11-08 DIAGNOSIS — N185 Chronic kidney disease, stage 5: Secondary | ICD-10-CM | POA: Diagnosis not present

## 2022-11-08 DIAGNOSIS — Z23 Encounter for immunization: Secondary | ICD-10-CM | POA: Diagnosis not present

## 2022-11-08 DIAGNOSIS — E8770 Fluid overload, unspecified: Secondary | ICD-10-CM | POA: Diagnosis present

## 2022-11-08 DIAGNOSIS — M898X9 Other specified disorders of bone, unspecified site: Secondary | ICD-10-CM

## 2022-11-08 DIAGNOSIS — C9000 Multiple myeloma not having achieved remission: Principal | ICD-10-CM | POA: Clinically undetermined

## 2022-11-08 DIAGNOSIS — Z791 Long term (current) use of non-steroidal anti-inflammatories (NSAID): Secondary | ICD-10-CM | POA: Insufficient documentation

## 2022-11-08 DIAGNOSIS — R1013 Epigastric pain: Secondary | ICD-10-CM | POA: Insufficient documentation

## 2022-11-08 DIAGNOSIS — R17 Unspecified jaundice: Secondary | ICD-10-CM

## 2022-11-08 DIAGNOSIS — E871 Hypo-osmolality and hyponatremia: Secondary | ICD-10-CM | POA: Diagnosis not present

## 2022-11-08 DIAGNOSIS — Z8249 Family history of ischemic heart disease and other diseases of the circulatory system: Secondary | ICD-10-CM | POA: Diagnosis not present

## 2022-11-08 DIAGNOSIS — Z87891 Personal history of nicotine dependence: Secondary | ICD-10-CM | POA: Insufficient documentation

## 2022-11-08 DIAGNOSIS — R011 Cardiac murmur, unspecified: Secondary | ICD-10-CM | POA: Insufficient documentation

## 2022-11-08 DIAGNOSIS — D630 Anemia in neoplastic disease: Secondary | ICD-10-CM | POA: Diagnosis present

## 2022-11-08 DIAGNOSIS — Z833 Family history of diabetes mellitus: Secondary | ICD-10-CM | POA: Diagnosis not present

## 2022-11-08 DIAGNOSIS — D649 Anemia, unspecified: Secondary | ICD-10-CM

## 2022-11-08 DIAGNOSIS — Z79899 Other long term (current) drug therapy: Secondary | ICD-10-CM | POA: Diagnosis not present

## 2022-11-08 DIAGNOSIS — R231 Pallor: Secondary | ICD-10-CM | POA: Insufficient documentation

## 2022-11-08 DIAGNOSIS — N179 Acute kidney failure, unspecified: Secondary | ICD-10-CM | POA: Insufficient documentation

## 2022-11-08 DIAGNOSIS — R600 Localized edema: Secondary | ICD-10-CM | POA: Insufficient documentation

## 2022-11-08 DIAGNOSIS — R768 Other specified abnormal immunological findings in serum: Secondary | ICD-10-CM

## 2022-11-08 DIAGNOSIS — R0602 Shortness of breath: Secondary | ICD-10-CM | POA: Insufficient documentation

## 2022-11-08 DIAGNOSIS — N189 Chronic kidney disease, unspecified: Secondary | ICD-10-CM | POA: Diagnosis present

## 2022-11-08 DIAGNOSIS — F17210 Nicotine dependence, cigarettes, uncomplicated: Secondary | ICD-10-CM | POA: Diagnosis present

## 2022-11-08 DIAGNOSIS — R000 Tachycardia, unspecified: Secondary | ICD-10-CM

## 2022-11-08 DIAGNOSIS — R5383 Other fatigue: Secondary | ICD-10-CM | POA: Insufficient documentation

## 2022-11-08 DIAGNOSIS — M899 Disorder of bone, unspecified: Secondary | ICD-10-CM

## 2022-11-08 DIAGNOSIS — I38 Endocarditis, valve unspecified: Secondary | ICD-10-CM

## 2022-11-08 DIAGNOSIS — M47812 Spondylosis without myelopathy or radiculopathy, cervical region: Secondary | ICD-10-CM | POA: Diagnosis present

## 2022-11-08 LAB — CBC WITH DIFFERENTIAL/PLATELET
Abs Immature Granulocytes: 0.13 10*3/uL — ABNORMAL HIGH (ref 0.00–0.07)
Basophils Absolute: 0.1 10*3/uL (ref 0.0–0.1)
Basophils Relative: 1 %
Eosinophils Absolute: 0.1 10*3/uL (ref 0.0–0.5)
Eosinophils Relative: 1 %
HCT: 14.9 % — ABNORMAL LOW (ref 39.0–52.0)
Hemoglobin: 4.8 g/dL — CL (ref 13.0–17.0)
Immature Granulocytes: 2 %
Lymphocytes Relative: 13 %
Lymphs Abs: 1.1 10*3/uL (ref 0.7–4.0)
MCH: 33.6 pg (ref 26.0–34.0)
MCHC: 32.2 g/dL (ref 30.0–36.0)
MCV: 104.2 fL — ABNORMAL HIGH (ref 80.0–100.0)
Monocytes Absolute: 0.6 10*3/uL (ref 0.1–1.0)
Monocytes Relative: 7 %
Neutro Abs: 6.4 10*3/uL (ref 1.7–7.7)
Neutrophils Relative %: 76 %
Platelets: 257 10*3/uL (ref 150–400)
RBC: 1.43 MIL/uL — ABNORMAL LOW (ref 4.22–5.81)
RDW: 14.3 % (ref 11.5–15.5)
WBC: 8.5 10*3/uL (ref 4.0–10.5)
nRBC: 0 % (ref 0.0–0.2)

## 2022-11-08 LAB — URINALYSIS, ROUTINE W REFLEX MICROSCOPIC
Bilirubin Urine: NEGATIVE
Glucose, UA: NEGATIVE mg/dL
Ketones, ur: NEGATIVE mg/dL
Leukocytes,Ua: NEGATIVE
Nitrite: NEGATIVE
Protein, ur: 30 mg/dL — AB
Specific Gravity, Urine: 1.006 (ref 1.005–1.030)
pH: 7 (ref 5.0–8.0)

## 2022-11-08 LAB — CREATININE, URINE, RANDOM: Creatinine, Urine: 35 mg/dL

## 2022-11-08 LAB — COMPREHENSIVE METABOLIC PANEL
ALT: 56 U/L — ABNORMAL HIGH (ref 0–44)
AST: 45 U/L — ABNORMAL HIGH (ref 15–41)
Albumin: 2.9 g/dL — ABNORMAL LOW (ref 3.5–5.0)
Alkaline Phosphatase: 65 U/L (ref 38–126)
Anion gap: 16 — ABNORMAL HIGH (ref 5–15)
BUN: 71 mg/dL — ABNORMAL HIGH (ref 6–20)
CO2: 18 mmol/L — ABNORMAL LOW (ref 22–32)
Calcium: 10.1 mg/dL (ref 8.9–10.3)
Chloride: 98 mmol/L (ref 98–111)
Creatinine, Ser: 9.15 mg/dL — ABNORMAL HIGH (ref 0.61–1.24)
GFR, Estimated: 6 mL/min — ABNORMAL LOW (ref >60)
Glucose, Bld: 119 mg/dL — ABNORMAL HIGH (ref 70–99)
Potassium: 4.4 mmol/L (ref 3.5–5.1)
Sodium: 132 mmol/L — ABNORMAL LOW (ref 135–145)
Total Bilirubin: 0.7 mg/dL (ref 0.3–1.2)
Total Protein: 7 g/dL (ref 6.5–8.1)

## 2022-11-08 LAB — SODIUM, URINE, RANDOM: Sodium, Ur: 44 mmol/L

## 2022-11-08 LAB — PREPARE RBC (CROSSMATCH)

## 2022-11-08 LAB — ABO/RH: ABO/RH(D): AB POS

## 2022-11-08 LAB — PROTEIN / CREATININE RATIO, URINE
Creatinine, Urine: 35 mg/dL
Protein Creatinine Ratio: 5.66 mg/mg{Cre} — ABNORMAL HIGH (ref 0.00–0.15)
Total Protein, Urine: 198 mg/dL

## 2022-11-08 LAB — BRAIN NATRIURETIC PEPTIDE: B Natriuretic Peptide: 846.7 pg/mL — ABNORMAL HIGH (ref 0.0–100.0)

## 2022-11-08 LAB — POC OCCULT BLOOD, ED: Fecal Occult Bld: NEGATIVE

## 2022-11-08 LAB — LIPASE, BLOOD: Lipase: 75 U/L — ABNORMAL HIGH (ref 11–51)

## 2022-11-08 MED ORDER — LIDOCAINE VISCOUS HCL 2 % MT SOLN
OROMUCOSAL | Status: AC
  Filled 2022-11-08: qty 15

## 2022-11-08 MED ORDER — FUROSEMIDE 10 MG/ML IJ SOLN
80.0000 mg | Freq: Two times a day (BID) | INTRAMUSCULAR | Status: DC
  Administered 2022-11-08 – 2022-11-09 (×2): 80 mg via INTRAVENOUS
  Filled 2022-11-08 (×2): qty 8

## 2022-11-08 MED ORDER — ALUM & MAG HYDROXIDE-SIMETH 200-200-20 MG/5ML PO SUSP
ORAL | Status: AC
  Filled 2022-11-08: qty 30

## 2022-11-08 MED ORDER — SODIUM CHLORIDE 0.9% IV SOLUTION: Freq: Once | INTRAVENOUS | Status: AC

## 2022-11-08 MED ORDER — ALUM & MAG HYDROXIDE-SIMETH 200-200-20 MG/5ML PO SUSP
30.0000 mL | Freq: Once | ORAL | Status: AC
  Administered 2022-11-08: 30 mL via ORAL

## 2022-11-08 MED ORDER — LIDOCAINE VISCOUS HCL 2 % MT SOLN
15.0000 mL | Freq: Once | OROMUCOSAL | Status: AC
  Administered 2022-11-08: 15 mL via OROMUCOSAL

## 2022-11-08 NOTE — ED Notes (Signed)
ED TO INPATIENT HANDOFF REPORT  ED Nurse Name and Phone #: [phone] RN Melony Overly  S Name/Age/Gender Drew Cooper 57 y.o. male Room/Bed: 005C/005C  Code Status   Code Status: Full Code  Home/SNF/Other Home Patient oriented to: self, place, time, and situation Is this baseline? Yes   Triage Complete: Triage complete  Chief Complaint Anemia [D64.9]  Triage Note Pt came in via POV d/t low Hemoglobin. States he had blood work done at [facility] today & they sent him here d/t hgb of 4 (per pt). Pt reports SOB, no energy & weakness since the 7th of this month. Denies Hx of anemia or any bleeding any where.    Allergies No Known Allergies  Level of Care/Admitting Diagnosis ED Disposition     ED Disposition  Admit   Condition  --   Comment  Hospital Area: [facility] [[id number]]  Level of Care: Progressive [102]  Admit to Progressive based on following criteria: NEPHROLOGY stable condition requiring close monitoring for AKI, requiring Hemodialysis or Peritoneal Dialysis either from expected electrolyte imbalance, acidosis, or fluid overload that can be managed by NIPPV or high flow oxygen.  May admit patient to [facility] or [facility] if equivalent level of care is available:: No  Covid Evaluation: Asymptomatic - no recent exposure (last 10 days) testing not required  Diagnosis: Anemia [454098]  Admitting Physician: Shelby Mattocks [[id number]]  Attending Physician: Nestor Ramp [[id number]]  Certification:: I certify this patient will need inpatient services for at least 2 midnights  Estimated Length of Stay: 3          B Medical/Surgery History History reviewed. No pertinent past medical history. Past Surgical History:  Procedure Laterality Date   KNEE SURGERY       A IV Location/Drains/Wounds Patient Lines/Drains/Airways Status     Active Line/Drains/Airways     Name Placement date Placement time Site Days   Peripheral IV 11/08/22 20 G Anterior;Right  Forearm 11/08/22  1440  Forearm  less than 1            Intake/Output Last 24 hours  Intake/Output Summary (Last 24 hours) at 11/08/2022 2132 Last data filed at 11/08/2022 1940 Gross per 24 hour  Intake 700 ml  Output --  Net 700 ml    Labs/Imaging Results for orders placed or performed during the hospital encounter of 11/08/22 (from the past 48 hour(s))  ABO/Rh     Status: None   Collection Time: 11/08/22 10:45 AM  Result Value Ref Range   ABO/RH(D)      AB POS Performed at [facility], [address], [place], [place] [address]   Prepare RBC (crossmatch)     Status: None   Collection Time: 11/08/22 12:59 PM  Result Value Ref Range   Order Confirmation      ORDER PROCESSED BY BLOOD BANK Performed at [facility], [address], [place], [place] [address]   Type and screen [facility]     Status: None (Preliminary result)   Collection Time: 11/08/22  3:05 PM  Result Value Ref Range   ABO/RH(D) AB POS    Antibody Screen NEG    Sample Expiration 11/11/2022,2359    Unit Number [id number]    Blood Component Type RED CELLS,LR    Unit division 00    Status of Unit ISSUED    Transfusion Status OK TO TRANSFUSE    Crossmatch Result Compatible    Unit Number  [id number] Performed at [facility], [address], [place], [place] [address]    Blood Component Type      RED CELLS,LR Performed at [facility], [address], [place], [place] [address]    Unit division      00 Performed at [facility], [address] [place], [place] [address]    Status of Unit QUARANTINED    Transfusion Status OK TO TRANSFUSE    Crossmatch Result      Compatible Performed at [facility], [address], [place], [place] [address]   Lipase, blood     Status: Abnormal   Collection Time: 11/08/22  3:05 PM  Result Value Ref Range   Lipase 75 (H) 11 - 51 U/L    Comment: Performed at [facility]  Lab, [address], [place], [place] [address]  POC occult blood, ED     Status: None   Collection Time: 11/08/22  3:19 PM  Result Value Ref Range   Fecal Occult Bld NEGATIVE NEGATIVE  Protein / creatinine ratio, urine     Status: Abnormal   Collection Time: 11/08/22  6:58 PM  Result Value Ref Range   Creatinine, Urine 35 mg/dL   Total Protein, Urine 198 mg/dL    Comment: RESULT CONFIRMED BY MANUAL DILUTION NO NORMAL RANGE ESTABLISHED FOR THIS TEST    Protein Creatinine Ratio 5.66 (H) 0.00 - 0.15 mg/mg[Cre]    Comment: Performed at [facility], [address], [place], [place] [address]  Sodium, urine, random     Status: None   Collection Time: 11/08/22  6:58 PM  Result Value Ref Range   Sodium, Ur 44 mmol/L    Comment: Performed at [facility], [address] [place], [place] [address]  Urinalysis, Routine w reflex microscopic -Urine, Clean Catch     Status: Abnormal   Collection Time: 11/08/22  6:58 PM  Result Value Ref Range   Color, Urine COLORLESS (A) YELLOW   APPearance CLEAR CLEAR   Specific Gravity, Urine 1.006 1.005 - 1.030   pH 7.0 5.0 - 8.0   Glucose, UA NEGATIVE NEGATIVE mg/dL   Hgb urine dipstick MODERATE (A) NEGATIVE   Bilirubin Urine NEGATIVE NEGATIVE   Ketones, ur NEGATIVE NEGATIVE mg/dL   Protein, ur 30 (A) NEGATIVE mg/dL   Nitrite NEGATIVE NEGATIVE   Leukocytes,Ua NEGATIVE NEGATIVE   RBC / HPF 0-5 0 - 5 RBC/hpf   WBC, UA 0-5 0 - 5 WBC/hpf   Bacteria, UA RARE (A) NONE SEEN   Squamous Epithelial / HPF 0-5 0 - 5 /HPF    Comment: Performed at [facility], [address], [place], [place] [address]  Creatinine, urine, random     Status: None   Collection Time: 11/08/22  6:58 PM  Result Value Ref Range   Creatinine, Urine 35 mg/dL    Comment: Performed at [facility], [address], [place], [place] [address]   US RENAL  Result Date: 11/08/2022 CLINICAL DATA:  Acute kidney injury. EXAM: RENAL / URINARY TRACT ULTRASOUND COMPLETE  COMPARISON:  None Available. FINDINGS: Right Kidney: Renal measurements: 10.6 cm x 4.1 cm x 4.5 cm = volume: 103 mL. Diffusely increased echogenicity of the renal parenchyma is noted. No mass or hydronephrosis visualized. Left Kidney: Renal measurements: 11.3 cm x 4.3 cm x 4.2 cm = volume: 105 mL. Diffusely increased echogenicity of the renal parenchyma is  noted. No mass or hydronephrosis visualized. Bladder: Appears normal for degree of bladder distention. Other: None. IMPRESSION: Bilateral echogenic kidneys which may be secondary to medical renal disease. Electronically Signed   By: Aram Candela M.D.   On: 11/08/2022 18:44   CT ABDOMEN PELVIS WO CONTRAST  Result Date: 11/08/2022 CLINICAL DATA:  Epigastric pain EXAM: CT ABDOMEN AND PELVIS WITHOUT CONTRAST TECHNIQUE: Multidetector CT imaging of the abdomen and pelvis was performed following the standard protocol without IV contrast. RADIATION DOSE REDUCTION: This exam was performed according to the departmental dose-optimization program which includes automated exposure control, adjustment of the mA and/or kV according to patient size and/or use of iterative reconstruction technique. COMPARISON:  None Available. FINDINGS: Lower chest: Bilateral small pleural effusions. Hepatobiliary: No focal hepatic lesion. Normal gallbladder. No biliary duct dilatation. Common bile duct is normal. Pancreas: Pancreas is normal. No ductal dilatation. No pancreatic inflammation. Spleen: Normal spleen Adrenals/urinary tract: Adrenal glands normal. Nonobstructing calculi in the RIGHT kidney. Ureters and bladder normal. Stomach/Bowel: Stomach, small bowel, appendix, and cecum are normal. The colon and rectosigmoid colon are normal. Vascular/Lymphatic: Abdominal aorta is normal caliber with atherosclerotic calcification. There is no retroperitoneal or periportal lymphadenopathy. No pelvic lymphadenopathy. Reproductive: Prostate unremarkable Other: No free fluid. Musculoskeletal:  Multiple subtle lucencies within the iliac bones including 12 mm lesion within the LEFT iliac bone on image 60/3. Small lesion on image 58 the same bone. Similar lesions in the RIGHT iliac bone on image 60/3. Mild permeative pattern in the superior border of the iliac wings. IMPRESSION: Subtle lucent lesions in the pelvic bones. Recommend correlation with myeloma serology. No acute findings abdomen pelvis. Bilateral pleural effusions. Electronically Signed   By: Genevive Bi M.D.   On: 11/08/2022 13:42   DG Chest Port 1 View  Result Date: 11/08/2022 CLINICAL DATA:  Dyspnea, anemia EXAM: PORTABLE CHEST 1 VIEW COMPARISON:  11/16/2007 chest radiograph. FINDINGS: Stable cardiomediastinal silhouette with normal heart size. No pneumothorax. No pleural effusion. Mild diffuse prominence of the parahilar interstitial markings. IMPRESSION: Mild diffuse prominence of the parahilar interstitial markings, differential includes mild pulmonary edema versus atypical/viral infection. Electronically Signed   By: Delbert Phenix M.D.   On: 11/08/2022 13:28    Pending Labs Unresulted Labs (From admission, onward)     Start     Ordered   11/09/22 0500  Renal function panel  Daily,   R      11/08/22 1753   11/09/22 0500  Hemoglobin A1c  Tomorrow morning,   R        11/08/22 1802   11/09/22 0500  CBC  Tomorrow morning,   R        11/08/22 1802   11/08/22 1751  ANA, IFA (with reflex)  Once,   URGENT        11/08/22 1752   11/08/22 1751  ANCA Titers  (Anti-Neutrophilic Cystoplasmic Antibody Panel (PNL))  Once,   URGENT        11/08/22 1752   11/08/22 1751  Antistreptolysin O titer  Once,   URGENT        11/08/22 1752   11/08/22 1751  C3 complement  Once,   URGENT        11/08/22 1752   11/08/22 1751  C4 complement  Once,   URGENT        11/08/22 1752   11/08/22 1751  Hepatitis panel, acute  Once,   URGENT        11/08/22 1752   11/08/22 1751  Kappa/lambda light chains  Once,   URGENT        11/08/22 1752    11/08/22 1751  Glomerular basement membrane antibodies  Once,   URGENT        11/08/22 1752   11/08/22 1751  Protein electrophoresis, serum  Once,   URGENT        11/08/22 1752   11/08/22 1741  HIV Antibody (routine testing w rflx)  (HIV Antibody (Routine testing w reflex) panel)  Once,   R        11/08/22 1802            Vitals/Pain Today's Vitals   11/08/22 1737 11/08/22 1742 11/08/22 1745 11/08/22 1815  BP: 127/76 124/81 123/74   Pulse: 79 84 95 86  Resp: 19 18 (!) 23 18  Temp: 98.7 F (37.1 C) 98.8 F (37.1 C)    TempSrc: Oral Oral    SpO2: 98%  99% 98%  PainSc:        Isolation Precautions No active isolations  Medications Medications  furosemide (LASIX) injection 80 mg (80 mg Intravenous Given 11/08/22 2049)  0.9 %  sodium chloride infusion (Manually program via Guardrails IV Fluids) (0 mLs Intravenous Stopped 11/08/22 1940)    Mobility walks     Focused Assessments Pulmonary Assessment Handoff:  Lung sounds:   O2 Device: Room Air      R Recommendations: See Admitting Provider Note  Report given to:   Additional Notes: none

## 2022-11-08 NOTE — Consult Note (Signed)
Renal Service Consult Note [facility]  Drew Cooper 11/08/2022 Drew Krabbe, MD Requesting Physician: Dr. Fredderick Cooper   Reason for Consult: Renal failure HPI: The patient is a 57 y.o. year-old w/ PMH as below who presented to ED w/ c/o SOB x 1 week and sig anemia found be an UC visit he did today. Also some epigastric pain, 1 episode n/v yesterday. Leg swelling he just noticed today. Taking meloxicam for neck pain for about 1 month. Minimal etoh. At UC his hb was 4 and creatinine was quite high. He was sent to [facility] ED.  In ED BP's a bit high initially, HR 88, RR 18, temp wnl. 98% on RA. Na 132  K 4.4  CO2 18  AG 16  BUN 71 and creat 9.15. Alb 2.9, AST 45, ALT 56, total protein 7.0. BNP 846. Hb 4.8, WBC 8K  plts 257.  Pt is to be admitted, and 2u prbc's ordered. We are asked to see pt for renal failure.   CXR in ED showed mild pulm edema. CT abdomen showed non-obstructing renal stones on the R, no hydro R of L. Normal appearing kidneys. UA is pending. Last creat was 2.21 in dec 2023, and prior to that 0.83 in 2015. No labs in CE.   Pt says he is having some SOB at home w/ exertion and lying down. He endorses nausea off and on "at bedtime". No fatigue. He denies any hx of kidney failure or seeing a kidney doctor. He has seen urologists for kidney stones and underwent some procedures.   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 They were exposed to lots of lead. Now he is painting some part-time.   ROS - denies CP, no joint pain, no HA, no blurry vision, no rash, no diarrhea, no nausea/ vomiting, no dysuria, no difficulty voiding   Past Medical History History reviewed. No pertinent past medical history. Past Surgical History  Past Surgical History:  Procedure Laterality Date   KNEE SURGERY     Family History  Family History  Problem Relation Age of Onset   Hypertension Mother    Heart failure Mother     Diabetes Mother    Social History  reports that he has quit smoking. His smoking use included cigarettes. He started smoking about 38 years ago. He has never used smokeless tobacco. He reports current alcohol use. He reports that he does not use drugs. Allergies No Known Allergies Home medications Prior to Admission medications   Medication Sig Start Date End Date Taking? Authorizing Provider  acetaminophen (TYLENOL) 500 MG tablet Take 500 mg by mouth every 6 (six) hours as needed for moderate pain.   Yes [provider]  lidocaine (LIDODERM) 5 % Place 1 patch onto the skin daily. 05/24/22  Yes [provider]  meloxicam (MOBIC) 15 MG tablet Take 15 mg by mouth daily.   Yes [provider]     Vitals:   11/08/22 1225 11/08/22 1226 11/08/22 1650 11/08/22 1722  BP:   136/78 126/79  Pulse:  83 86 88  Resp:  16 18 14   Temp:    98.5 F (36.9 C)  TempSrc:    Oral  SpO2: 100% 100% 100%    Exam Gen alert, no distress No rash, cyanosis or gangrene Sclera anicteric, throat clear  No jvd or bruits Chest clear bilat to bases, no rales/ wheezing RRR no MRG  Abd soft ntnd no mass or ascites +bs GU wnl MS no joint effusions or deformity Ext 2+ bilat pretib pitting edema Neuro is alert, Ox 3 , nf, no asterixis, no confusion     Home meds include - meloxicam every day, tylenol, lidocaine patch   BP's initial 171/102 --> now 127/ 76   HR 88  RR 20     Labs --> Na 132  K 4.4 BUN 71  Cr 9.15   Ca 10.1, alb 2.9, CCa 10.9   ALT 56, AST 45, tbili 0.7, total protein 7.0   Hb 4.8 MCV 104   plt 257    CXR - IMPRESSION: Mild diffuse prominence of the parahilar interstitial markings, differential includes mild pulmonary edema versus atypical/viral infection.    CT abd noncontrast -->  IMPRESSION: Multiple subtle lucencies within the iliac bones including 12 mm lesion within the LEFT iliac bone on image 60/3. Small lesion on image 58 the same bone. Similar lesions in the  RIGHT iliac bone on image 60/3. Mild permeative pattern in the superior border of the iliac wings.  O/w no acute findings in abd / pelvis.      Dec 2023 --> creat 2.21 in epic  Assessment/ Plan: Renal failure - not sure how acute this is since labs from dec 2023 showed elevation at 2.2.  Creat here is quite high at 9.0, w/ vol overload, and mild uremic symptoms (nausea). CT shows no obstruction. UA is pending. Anemia is severe and there are some lucent lesions in the pelvic bones which could be c/w multiple myeloma. Total prot is not high, but CCa is a bit high though. He has been taking mobic for 4 wks but unlikely this would do this much damage in that timeframe. Will order serologies and the testing for myeloma, get UA, urine lytes and UP/C ratio. He is not severely uremic and HD is not needed at this time. He does have pulm edema so IV lasix will be started. I told him dialysis may be necessary but not now. Will follow.  Vol overload - as above BP - stable BP's here Anemia, severe - as above, getting 2u prbc's tonight.       Drew Moselle  MD [facility] 11/08/2022, 5:27 PM  Recent Labs  Lab 11/08/22 1045  HGB 4.8*  ALBUMIN 2.9*  CALCIUM 10.1  CREATININE 9.15*  K 4.4   Inpatient medications:

## 2022-11-08 NOTE — ED Triage Notes (Signed)
Patient here today with c/o mid lower chest pain X 1 week. Patient states that when he lays down at night, its hard to breath.

## 2022-11-08 NOTE — Assessment & Plan Note (Addendum)
Creatinine 2.2 in January 2024 and then creatinine 5 in May 2024 according to PCP labs.  Presentation today is concerning for chronic nature although still quite progressive for this patient with no diagnosed medical conditions.  He does still produce urine and does not meet indication for urgent dialysis. -Admit to [facility] TS, progressive unit, attending Dr. Jennette Kettle -Nephrology following, appreciate recommendations -Follow-up renal ultrasound -Lasix 80 mg twice daily for pulmonary edema -Investigative nephrology labs: ANA, ANCA titers, ASO titer, C3/C4, SPEP, light chain labs -AM RFP, A1c

## 2022-11-08 NOTE — Assessment & Plan Note (Addendum)
Suspect chronic in nature given physical exam.  Most likely etiology points to chronic kidney failure. -Transfuse 2u PRBC, post H&H 2 hours after -Transfusion threshold <7 -AM CBC

## 2022-11-08 NOTE — ED Notes (Addendum)
Patient is being discharged from the Urgent Care and sent to the Emergency Department via private vehicle . Per provider, patient is in need of higher level of care due to critically low hemoglobin level. Patient is aware and verbalizes understanding of plan of care.  Vitals:   11/08/22 1029  BP: (!) 156/95  Pulse: (!) 102  Resp: 16  Temp: 98.3 F (36.8 C)  SpO2: 95%

## 2022-11-08 NOTE — H&P (Signed)
Hospital Admission History and Physical Service Pager: [phone]  Patient name: Drew Cooper Medical record number: [id number] Date of Birth: 1965-12-22 Age: 57 y.o. Gender: male  Primary Care Provider: Patient, No Pcp Per  Consultants: Nephrology Code Status: Full which was confirmed with patient  Preferred Emergency Contact:  Contact Information     Name Relation Home Work Mobile   Calderwood,Beverly Spouse [phone]  [phone]      Other Contacts   None on File    Chief Complaint: Fatigue  Assessment and Plan: Drew Cooper is a 57 y.o. male presenting with fatigue and shortness of breath. Differential for this patient's presentation of this includes anemia secondary to chronic kidney disease, GI bleed, infectious or malignant etiology, bone marrow failure.  Given the presentation, I am less concern for acute bleed and with accompanying continued rising creatinine I do suspect that patient's significant anemia is explained by chronic renal failure.  However given the lesions presenting on his pelvic bones, there is a concern for multiple myeloma even though he does not fit the full picture of this condition. [facility]     * (Principal) CKD (chronic kidney disease)     Creatinine 2.2 in January 2024 and then creatinine 5 in May 2024  according to PCP labs.  Presentation today is concerning for chronic  nature although still quite progressive for this patient with no diagnosed  medical conditions.  He does still produce urine and does not meet  indication for urgent dialysis. -Admit to FM TS, progressive unit, attending Dr. Jennette Kettle -Nephrology following, appreciate recommendations -Follow-up renal ultrasound -Lasix 80 mg twice daily for pulmonary edema -Investigative nephrology labs: ANA, ANCA titers, ASO titer, C3/C4, SPEP,  light chain labs -AM RFP, A1c        Anemia     Suspect chronic in nature given physical exam.  Most likely etiology   points to chronic kidney failure. -Transfuse 2u PRBC, post H&H 2 hours after -Transfusion threshold <7 -AM CBC        Jaundice     His jaundice is puzzling as his primary concern is of a renal etiology.   Slightly abnormal LFTs and bilirubin such as his would not point toward a  hepatic or biliary etiology. -Hepatitis panel     Chronic and Stable Conditions: None  FEN/GI: Renal diet VTE Prophylaxis: SCDs  Disposition: Progressive  History of Present Illness:  Drew Cooper is a 57 y.o. male presenting with fatigue and shortness of breath.  For the last week, has been tired/fatigued, difficulty breathing when he lays flat, decreased energy level.  He went to an urgent care to be evaluated and was told that he had a low hemoglobin and was advised to proceed to the ED.  Denies recent sickness. He has been getting very cold at home. He is urinating and stooling normally, denies blood in urine or stool. Appetite has been less for the last few days. His last physical was in February, denies history of hypertension or diabetes. He has never been told he has kidney problems.   In the ED, presented with hemoglobin 4.8, creatinine 9.15, BUN 846.  Review Of Systems: Per HPI  Pertinent Past Medical History: Kidney stone Remainder reviewed in history tab.   Pertinent Past Surgical History: Left knee surgery  Remainder reviewed in history tab.  Pertinent Social History: Tobacco use: quitin 2023, [id number] 1ppd Alcohol use: couple beers here and there, hasn't drank  for a couple months Other Substance use: none Lives with wife  Pertinent Family History: Mother: HTN, heart failure, diabetes  Remainder reviewed in history tab.   Important Outpatient Medications: Beet powder Meloxicam 1 per day for the last 2 weeks for arthritis in his neck? Remainder reviewed in medication history.   Objective: BP 123/74   Pulse 86   Temp 98.7 F (37.1 C) (Oral)   Resp 18   SpO2 98%   Exam: General: Awake and alert, NAD Eyes: Normal conjunctiva Neck: No cervical lymphadenopathy appreciated Cardiovascular: RRR, no murmurs auscultated Respiratory: CTAB, normal WOB Gastrointestinal: Soft, nontender, normoactive bowel sounds Extremities: Trace to 1+ pitting edema BLEs below the knees Derm: Jaundiced Neuro: Conversationally appropriate, alert and oriented x 4 Psych: Normal mood and affect  Labs:  CBC BMET  Recent Labs  Lab 11/08/22 1045  WBC 8.5  HGB 4.8*  HCT 14.9*  PLT 257   Recent Labs  Lab 11/08/22 1045  NA 132*  K 4.4  CL 98  CO2 18*  BUN 71*  CREATININE 9.15*  GLUCOSE 119*  CALCIUM 10.1     BUN 846.7 Lipase 75 FOBT negative  EKG: NSR, normal PR and QRS interval, no sign of ST changes or QT prolongation  Imaging Studies Performed: CXR: Mild diffuse prominence of the parahilar interstitial markings, differential includes mild pulmonary edema versus atypical/viral infection  CT A/P: Subtle lucent lesions in the pelvic bones. Recommend correlation with myeloma serology. No acute findings abdomen pelvis. Bilateral pleural effusions.   Shelby Mattocks, DO 11/08/2022, 7:55 PM PGY-3, [facility]  FPTS Intern pager: [phone], text pages welcome Secure chat group [facility] Teaching Service

## 2022-11-08 NOTE — ED Notes (Signed)
Not able to scan second unit of blood, spiked blood bag returned to blood bank

## 2022-11-08 NOTE — ED Triage Notes (Signed)
Pt came in via POV d/t low Hemoglobin. States he had blood work done at [facility] today & they sent him here d/t hgb of 4 (per pt). Pt reports SOB, no energy & weakness since the 7th of this month. Denies Hx of anemia or any bleeding any where.

## 2022-11-08 NOTE — Progress Notes (Signed)
FMTS Interim Progress Note  S: In to round on patient with Dr. Barb Merino. Patient doing well, no concerns at this time.  Denies lightheaded or dizziness.  Discussed plan for evaluation of kidney function with nephrology.  O: BP 123/74   Pulse 86   Temp 98.8 F (37.1 C) (Oral)   Resp 18   SpO2 98%   General: NAD, jaundiced HEENT: No scleral icterus Neuro: A&O Cardiovascular: RRR, no murmurs, trace peripheral edema Respiratory: normal WOB on RA, bibasilar crackles Abdomen: soft, NTTP, no rebound or guarding Extremities: Moving all 4 extremities equally   A/P: CKD of unknown cause Per nephrology does not currently meet indications for emergent dialysis.  Vitals and exam remain reassuringly stable this evening.  Extensive workup ordered per nephrology for cause of renal disease. -Nephrology consulted, recs appreciated -Continue Lasix 80 mg twice daily for pulmonary edema -Will follow-up ANA, ANCA, ASO, C3/C4, SPEP, light chain as able -AM RFP  Anemia Suspect anemia due to chronic renal disease given no obvious active bleed source and normal to elevated MCV.  Status post 1 unit RBC, 1 more unit pending.  VSS, asymptomatic. -Repeat H&H after transfusion -Transfusion threshold less than 7 -Could consider anemia panel however would be confounded now status post blood transfusion  Jaundice Is jaundiced on exam.  No obvious hepatomegaly, and interestingly well epidermis is jaundiced there is no scleral icterus. -Follow-up acute hepatitis panel as able  Orders and labs reviewed.  Remainder of plan per day team  Celine Mans, MD 11/08/2022, 8:10 PM PGY-2, [facility] Family Medicine Service pager [phone]

## 2022-11-08 NOTE — Progress Notes (Signed)
Patient arrived to the floor alert and oriented, ambulated to the bed. Stand up weight obtained, CHG given and cardiac monitoring initiated. We'll continue to monitor.

## 2022-11-08 NOTE — Discharge Instructions (Signed)
You have a critically low hemoglobin level (4).  You must be seen in the emergency room for a further workup. Please have someone drive you there - I do not want you walking or driving. Head straight to the ER now for further workup.

## 2022-11-08 NOTE — ED Provider Notes (Signed)
[place] [facility] AT [facility] Provider Note   CSN: [id number] Arrival date & time: 11/08/22  1216     History  Chief Complaint  Patient presents with   Low Hemoglobin    Drew Cooper is a 57 y.o. male.  Patient is a 57 year old male with no known past medical history.  He presents with anemia that was found at a recent urgent care visit today.  He reports some shortness of breath over the last week that has gotten worse over the last 3 days.  He has had some pain in his epigastric area.  He denies any nausea and vomiting other than he had 1 episode of vomiting this morning which he said only contained stomach contents.  No hematemesis.  He has noted some leg swelling this morning.  He denies any other abdominal pain.  No cough or cold symptoms.  No fevers.  No significant history of anemia.  He says he rarely drinks alcohol.  He has been taking meloxicam for a neck issue for about the last month.  He was seen in urgent care today and was noted to have a hemoglobin of 4 and to be in acute renal failure.  He denies any known issues with his kidneys in the past other than kidney stones..       Home Medications Prior to Admission medications   Medication Sig Start Date End Date Taking? Authorizing Provider  meloxicam (MOBIC) 15 MG tablet Take 15 mg by mouth daily.    [provider]      Allergies    Patient has no known allergies.    Review of Systems   Review of Systems  Constitutional:  Positive for fatigue. Negative for chills, diaphoresis and fever.  HENT:  Negative for congestion, rhinorrhea and sneezing.   Eyes: Negative.   Respiratory:  Positive for shortness of breath. Negative for cough and chest tightness.   Cardiovascular:  Positive for leg swelling. Negative for chest pain.  Gastrointestinal:  Positive for abdominal pain (Epigastric). Negative for blood in stool, diarrhea, nausea and vomiting.  Genitourinary:  Negative  for difficulty urinating, flank pain, frequency and hematuria.  Musculoskeletal:  Negative for arthralgias and back pain.  Skin:  Negative for rash.  Neurological:  Negative for dizziness, speech difficulty, weakness, numbness and headaches.    Physical Exam Updated Vital Signs BP (!) 171/102   Pulse 83   Temp 97.8 F (36.6 C) (Oral)   Resp 16   SpO2 100%  Physical Exam Constitutional:      Appearance: He is well-developed.  HENT:     Head: Normocephalic and atraumatic.  Eyes:     Pupils: Pupils are equal, round, and reactive to light.  Cardiovascular:     Rate and Rhythm: Normal rate and regular rhythm.     Heart sounds: Normal heart sounds.  Pulmonary:     Effort: Pulmonary effort is normal. No respiratory distress.     Breath sounds: Normal breath sounds. No wheezing or rales.  Chest:     Chest wall: No tenderness.  Abdominal:     General: Bowel sounds are normal.     Palpations: Abdomen is soft.     Tenderness: There is no abdominal tenderness (Mild tenderness to the epigastrium). There is no guarding or rebound.  Musculoskeletal:        General: Normal range of motion.     Cervical back: Normal range of motion and neck supple.  Lymphadenopathy:  Cervical: No cervical adenopathy.  Skin:    General: Skin is warm and dry.     Findings: No rash.     Comments: Some purpuric lesions to his arms  Neurological:     Mental Status: He is alert and oriented to person, place, and time.     ED Results / Procedures / Treatments   Labs (all labs ordered are listed, but only abnormal results are displayed) Labs Reviewed  LIPASE, BLOOD - Abnormal; Notable for the following components:      Result Value   Lipase 75 (*)    All other components within normal limits  POC OCCULT BLOOD, ED  TYPE AND SCREEN  PREPARE RBC (CROSSMATCH)  ABO/RH    EKG None  Radiology CT ABDOMEN PELVIS WO CONTRAST  Result Date: 11/08/2022 CLINICAL DATA:  Epigastric pain EXAM: CT ABDOMEN  AND PELVIS WITHOUT CONTRAST TECHNIQUE: Multidetector CT imaging of the abdomen and pelvis was performed following the standard protocol without IV contrast. RADIATION DOSE REDUCTION: This exam was performed according to the departmental dose-optimization program which includes automated exposure control, adjustment of the mA and/or kV according to patient size and/or use of iterative reconstruction technique. COMPARISON:  None Available. FINDINGS: Lower chest: Bilateral small pleural effusions. Hepatobiliary: No focal hepatic lesion. Normal gallbladder. No biliary duct dilatation. Common bile duct is normal. Pancreas: Pancreas is normal. No ductal dilatation. No pancreatic inflammation. Spleen: Normal spleen Adrenals/urinary tract: Adrenal glands normal. Nonobstructing calculi in the RIGHT kidney. Ureters and bladder normal. Stomach/Bowel: Stomach, small bowel, appendix, and cecum are normal. The colon and rectosigmoid colon are normal. Vascular/Lymphatic: Abdominal aorta is normal caliber with atherosclerotic calcification. There is no retroperitoneal or periportal lymphadenopathy. No pelvic lymphadenopathy. Reproductive: Prostate unremarkable Other: No free fluid. Musculoskeletal: Multiple subtle lucencies within the iliac bones including 12 mm lesion within the LEFT iliac bone on image 60/3. Small lesion on image 58 the same bone. Similar lesions in the RIGHT iliac bone on image 60/3. Mild permeative pattern in the superior border of the iliac wings. IMPRESSION: Subtle lucent lesions in the pelvic bones. Recommend correlation with myeloma serology. No acute findings abdomen pelvis. Bilateral pleural effusions. Electronically Signed   By: Genevive Bi M.D.   On: 11/08/2022 13:42   DG Chest Port 1 View  Result Date: 11/08/2022 CLINICAL DATA:  Dyspnea, anemia EXAM: PORTABLE CHEST 1 VIEW COMPARISON:  11/16/2007 chest radiograph. FINDINGS: Stable cardiomediastinal silhouette with normal heart size. No  pneumothorax. No pleural effusion. Mild diffuse prominence of the parahilar interstitial markings. IMPRESSION: Mild diffuse prominence of the parahilar interstitial markings, differential includes mild pulmonary edema versus atypical/viral infection. Electronically Signed   By: Delbert Phenix M.D.   On: 11/08/2022 13:28    Procedures Procedures    Medications Ordered in ED Medications  0.9 %  sodium chloride infusion (Manually program via Guardrails IV Fluids) (has no administration in time range)    ED Course/ Medical Decision Making/ A&P                                 Medical Decision Making Amount and/or Complexity of Data Reviewed Labs: ordered. Radiology: ordered.  Risk Prescription drug management. Decision regarding hospitalization.   Patient is a 57 year old male who presents with shortness of breath and some general weakness.  He is noted to be markedly anemic with a hemoglobin of 4 which was noted on labs earlier today.  He is also noted  to be in acute renal failure with a creatinine of 9.  His potassium is normal.  His chest x-ray which was interpreted by me and confirmed by the radiologist shows some mild increased interstitial markings.  His BNP is elevated.  Legrand Rams that this is a little bit of edema rather than infection.  No other infectious symptoms.  He has some mild epigastric tenderness his lipase is mildly elevated.  CT scan does not show any acute abnormalities other than some possible lucencies in his pelvic bones which could represent myeloma.  He was typed and crossed for 2 units of packed red cells.  I have consulted Dr. Arlean Hopping with nephrology who will see the patient.  I also spoke with the family medicine service who will admit the patient for further treatment.  CRITICAL CARE Performed by: Rolan Bucco Total critical care time: 70 minutes Critical care time was exclusive of separately billable procedures and treating other patients. Critical care was  necessary to treat or prevent imminent or life-threatening deterioration. Critical care was time spent personally by me on the following activities: development of treatment plan with patient and/or surrogate as well as nursing, discussions with consultants, evaluation of patient's response to treatment, examination of patient, obtaining history from patient or surrogate, ordering and performing treatments and interventions, ordering and review of laboratory studies, ordering and review of radiographic studies, pulse oximetry and re-evaluation of patient's condition.   Final Clinical Impression(s) / ED Diagnoses Final diagnoses:  Anemia, unspecified type  Acute renal failure, unspecified acute renal failure type [facility])    Rx / DC Orders ED Discharge Orders     None         Rolan Bucco, MD 11/08/22 1556

## 2022-11-08 NOTE — ED Provider Notes (Signed)
[facility]    CSN: [id number] Arrival date & time: 11/08/22  1004      History   Chief Complaint Chief Complaint  Patient presents with   Chest Pain    HPI Drew Cooper is a 57 y.o. male.   Pleasant 57 year old male with no known chronic medical conditions presents today due to concerns of 1 week of what he describes as chest pain.  He points to his xiphoid epigastric region, and states that it does radiate to his a right upper quadrant at times.  He states the symptoms started 1 week ago at the same time in which he started taking meloxicam for suspected neck issues.  He does report feeling fatigued.  He denies dizziness, lightheadedness, syncope.  He notes that laying flat at night causes significant shortness of breath, and last evening he had to sleep sitting up in a chair.  He reports a new onset of edema that started just this morning.  He does have bruising to his forearms, denies taking any aspirin or blood thinners.  He does not smoke, no history of asthma or COPD.  States he does drink intermittent beer, but not on a daily basis.  Reports having had a Cologuard completed in March which was reportedly normal.  Did have hepatitis C screening at 1 point, patient states it is negative.  Patient denies a wheezing, but does report feeling slightly short of breath at rest sitting upright, much worse with laying flat. Pt denies known cardiac issues and denies hx of HTN, CHF, or murmurs.   Chest Pain   History reviewed. No pertinent past medical history.  There are no problems to display for this patient.   Past Surgical History:  Procedure Laterality Date   KNEE SURGERY         Home Medications    Prior to Admission medications   Medication Sig Start Date End Date Taking? Authorizing Provider  meloxicam (MOBIC) 15 MG tablet Take 15 mg by mouth daily.   Yes [provider]    Family History Family History  Problem Relation Age of Onset    Hypertension Mother    Heart failure Mother    Diabetes Mother     Social History Social History   Tobacco Use   Smoking status: Former    Current packs/day: 0.50    Types: Cigarettes   Smokeless tobacco: Never  Vaping Use   Vaping status: Never Used  Substance Use Topics   Alcohol use: Yes    Comment: 2 beers per week   Drug use: No     Allergies   Patient has no known allergies.   Review of Systems Review of Systems  Cardiovascular:  Positive for chest pain.  As per HPI   Physical Exam Triage Vital Signs ED Triage Vitals  Encounter Vitals Group     BP 11/08/22 1029 (!) 156/95     Systolic BP Percentile --      Diastolic BP Percentile --      Pulse Rate 11/08/22 1029 (!) 102     Resp 11/08/22 1029 16     Temp 11/08/22 1029 98.3 F (36.8 C)     Temp Source 11/08/22 1029 Oral     SpO2 11/08/22 1029 95 %     Weight 11/08/22 1029 186 lb (84.4 kg)     Height 11/08/22 1029 5\' 10"  (1.778 m)     Head Circumference --      Peak Flow --  Pain Score 11/08/22 1028 6     Pain Loc --      Pain Education --      Exclude from Growth Chart --    No data found.  Updated Vital Signs BP (!) 156/95 (BP Location: Right Arm)   Pulse (!) 102   Temp 98.3 F (36.8 C) (Oral)   Resp 16   Ht 5\' 10"  (1.778 m)   Wt 186 lb (84.4 kg)   SpO2 95%   BMI 26.69 kg/m   Visual Acuity Right Eye Distance:   Left Eye Distance:   Bilateral Distance:    Right Eye Near:   Left Eye Near:    Bilateral Near:     Physical Exam Vitals and nursing note reviewed.  Constitutional:      General: He is not in acute distress.    Appearance: Normal appearance. He is normal weight. He is ill-appearing. He is not diaphoretic.  HENT:     Head: Normocephalic and atraumatic.     Mouth/Throat:     Mouth: Mucous membranes are moist.     Pharynx: Oropharynx is clear. No oropharyngeal exudate.  Eyes:     Extraocular Movements: Extraocular movements intact.     Pupils: Pupils are equal,  round, and reactive to light.     Comments: B conjunctival pallor  Neck:     Vascular: Hepatojugular reflux present.  Cardiovascular:     Rate and Rhythm: Tachycardia present.     Heart sounds: Murmur (R and L #2 ICS DIASTOLIC MURMUR, with radiation to carotids) heard.     Diastolic murmur is present with a grade of 2/4.  Pulmonary:     Effort: Pulmonary effort is normal. No respiratory distress.     Breath sounds: No stridor. Rales (scant to posterior B bases) present. No wheezing or rhonchi.  Abdominal:     Palpations: Abdomen is soft. There is hepatomegaly.     Tenderness: There is abdominal tenderness (RUQ / epigastric).  Musculoskeletal:     Cervical back: Normal range of motion and neck supple. No tenderness.     Right lower leg: Edema present.     Left lower leg: Edema present.  Lymphadenopathy:     Cervical: No cervical adenopathy.  Skin:    General: Skin is warm.     Coloration: Skin is pale.     Findings: Ecchymosis (pt with scattered purpur to B forearms) present.  Neurological:     General: No focal deficit present.     Mental Status: He is alert and oriented to person, place, and time.      UC Treatments / Results  Labs (all labs ordered are listed, but only abnormal results are displayed) Labs Reviewed  CBC WITH DIFFERENTIAL/PLATELET - Abnormal; Notable for the following components:      Result Value   RBC 1.43 (*)    Hemoglobin 4.8 (*)    HCT 14.9 (*)    MCV 104.2 (*)    Abs Immature Granulocytes 0.13 (*)    All other components within normal limits  COMPREHENSIVE METABOLIC PANEL - Abnormal; Notable for the following components:   Sodium 132 (*)    CO2 18 (*)    Glucose, Bld 119 (*)    BUN 71 (*)    Creatinine, Ser 9.15 (*)    Albumin 2.9 (*)    AST 45 (*)    ALT 56 (*)    GFR, Estimated 6 (*)    Anion gap 16 (*)  All other components within normal limits  BRAIN NATRIURETIC PEPTIDE    EKG   Radiology No results  found.  Procedures Procedures (including critical care time)  Medications Ordered in UC Medications  alum & mag hydroxide-simeth (MAALOX/MYLANTA) 200-200-20 MG/5ML suspension 30 mL (30 mLs Oral Given 11/08/22 1052)  lidocaine (XYLOCAINE) 2 % viscous mouth solution 15 mL (15 mLs Mouth/Throat Given 11/08/22 1052)    Initial Impression / Assessment and Plan / UC Course  I have reviewed the triage vital signs and the nursing notes.  Pertinent labs & imaging results that were available during my care of the patient were reviewed by me and considered in my medical decision making (see chart for details).     Acute epigastric pain - GI cocktail was provided in office with mild symptomatic relief.  Upon receipt of patient's labs, significant concern that his epigastric/right upper quadrant pain may be a bleeding ulcer.  Acute GI bleed is at the top differential. Acute anemia -chart review shows that he had an 11.7 hemoglobin 8 months ago which was never further evaluated.  He did have a Cologuard however in March of this year which was apparently normal.  Hemoglobin today is 4.8.  Sending to ER for further workup Tachycardia -secondary to acute renal failure and severe anemia Diastolic murmur -new onset.  Question if secondary to acute issues, versus undiagnosed cardiovascular disease.  Will leave workup to emergency room as indicated Peripheral edema - BNP elevated. Pt describing concerns of acute CHF. CXR had been ordered, however upon receipt of critical labs, cancelled test and will allow ER to perform workup per their discretion. Fatigue - secondary to acute renal failure and significant anemia. Pallor -as above Shortness of breath - secondary to anemia, ARF, and suspected cardiovascular disease. ER workup  Acute renal failure -lab review shows that his last GFR was 34, creatinine 2.21 back 8 months ago.  He was recently started on NSAID which likely caused his acute renal failure.  Discharged  to emergency room for further evaluation and workup.    Final Clinical Impressions(s) / UC Diagnoses   Final diagnoses:  Acute epigastric pain  Acute anemia  Tachycardia  Diastolic murmur  Peripheral edema  Other fatigue  Pallor  Shortness of breath  Acute renal failure, unspecified acute renal failure type [facility])     Discharge Instructions      You have a critically low hemoglobin level (4).  You must be seen in the emergency room for a further workup. Please have someone drive you there - I do not want you walking or driving. Head straight to the ER now for further workup.     ED Prescriptions   None    PDMP not reviewed this encounter.   Maretta Bees, [place] 11/08/22 1141

## 2022-11-08 NOTE — Assessment & Plan Note (Addendum)
His jaundice is puzzling as his primary concern is of a renal etiology.  Slightly abnormal LFTs and bilirubin such as his would not point toward a hepatic or biliary etiology. -Hepatitis panel

## 2022-11-08 NOTE — Plan of Care (Signed)
POC initiated and progressing. 

## 2022-11-08 NOTE — ED Notes (Signed)
Blood infusing at this time, lab will be drawn post transfusion per policy.

## 2022-11-08 NOTE — Hospital Course (Addendum)
Drew Cooper is a 57 y.o.male with a history of tobacco abuse, nephrolithiasis who was admitted to the family medicine teaching Service at [facility] and was found to have advanced multiple myeloma. His hospital course is detailed below:  Chronic kidney disease Presented with fatigue and dyspnea.  In the ED hemoglobin found to be 4.8, creatinine 9.15.  Previous creatinine in January 2.2, then in May 5.0.  Patient recommended to follow-up with allergy per PCP but did not.  Nephrology was consulted.  Renal ultrasound showed bilateral signs of medical renal disease.  Patient got two doses of Lasix for volume reduction.  Extensive workup initiated for cause of chronic renal failure without known chronic disease.  Workup was significant for multiple myeloma.  Nephrology decided to treat patient with plasmapheresis. Patient had a tunneled catheter placed in his right IJ on 815 and completed his first plasmapheresis treatment later that day. Completed second plasmapheresis on 8/17. Nephrology to coordinate remaining plasmapheresis on an outpatient basis. Creatinine slowly improved to 7.19 by the day of discharge.  Normocytic anemia Hemoglobin found to be 4.8 in the ED.  Likely contributing to patient's fatigue.  No obvious signs of bleeding and fecal occult blood test was negative suggesting chronic etiology.  Thought to be secondary to renal disease.  Patient was initially transfused with 2 units of PRBCs and hemoglobin improved to 6.3. Oncology was consulted and recommended transfusion threshold of less than 8.  The patient received another unit of packed red blood cells on 8/13 and his hemoglobin remained greater than 8 for the remainder of the hospital stay.  Concern for multiple myeloma CT abdomen pelvis showed subtle lucency of pelvic bones.  This in the setting of anemia and kidney disease highly suspicious for multiple myeloma.  Lab results showed significantly elevated lambda and kappa free light  chains, elevated beta-2 microglobulin, elevated LDH, M spike in the beta globulin on SPEP, and elevated urine protein.  Bone marrow biopsy showed 53% plasma cells, confirming the diagnosis of multiple myeloma.  The patient was started on palliative chemotherapy (velcade and decadron) on 8/16 and tolerated this well. He was given prevnar vaccine and started on azithromycin for viral ppx. Oncology will continue treatments on an outpatient basis.   PCP Follow-up Recommendations: Oncology follow up for palliative chemotherapy treatment scheduled Nephrology follow up for plasmapheresis- they will call patient to schedule Hospital follow up scheduled- f/u PLEX treatments, chemo

## 2022-11-09 ENCOUNTER — Inpatient Hospital Stay (HOSPITAL_COMMUNITY): Payer: BC Managed Care – PPO

## 2022-11-09 ENCOUNTER — Encounter (HOSPITAL_COMMUNITY): Payer: Self-pay | Admitting: Student

## 2022-11-09 DIAGNOSIS — M899 Disorder of bone, unspecified: Secondary | ICD-10-CM | POA: Diagnosis not present

## 2022-11-09 DIAGNOSIS — N179 Acute kidney failure, unspecified: Secondary | ICD-10-CM

## 2022-11-09 DIAGNOSIS — C9000 Multiple myeloma not having achieved remission: Secondary | ICD-10-CM | POA: Clinically undetermined

## 2022-11-09 DIAGNOSIS — D649 Anemia, unspecified: Secondary | ICD-10-CM

## 2022-11-09 LAB — CBC
HCT: 19.3 % — ABNORMAL LOW (ref 39.0–52.0)
Hemoglobin: 6.3 g/dL — CL (ref 13.0–17.0)
MCH: 32.1 pg (ref 26.0–34.0)
MCHC: 32.6 g/dL (ref 30.0–36.0)
MCV: 98.5 fL (ref 80.0–100.0)
Platelets: 217 10*3/uL (ref 150–400)
RBC: 1.96 MIL/uL — ABNORMAL LOW (ref 4.22–5.81)
RDW: 17.6 % — ABNORMAL HIGH (ref 11.5–15.5)
WBC: 8.7 10*3/uL (ref 4.0–10.5)
nRBC: 0 % (ref 0.0–0.2)

## 2022-11-09 LAB — RENAL FUNCTION PANEL
Albumin: 2.7 g/dL — ABNORMAL LOW (ref 3.5–5.0)
Anion gap: 17 — ABNORMAL HIGH (ref 5–15)
BUN: 70 mg/dL — ABNORMAL HIGH (ref 6–20)
CO2: 21 mmol/L — ABNORMAL LOW (ref 22–32)
Calcium: 9.6 mg/dL (ref 8.9–10.3)
Chloride: 98 mmol/L (ref 98–111)
Creatinine, Ser: 8.98 mg/dL — ABNORMAL HIGH (ref 0.61–1.24)
GFR, Estimated: 6 mL/min — ABNORMAL LOW (ref >60)
Glucose, Bld: 90 mg/dL (ref 70–99)
Phosphorus: 8.2 mg/dL — ABNORMAL HIGH (ref 2.5–4.6)
Potassium: 4.3 mmol/L (ref 3.5–5.1)
Sodium: 136 mmol/L (ref 135–145)

## 2022-11-09 LAB — HEMOGLOBIN AND HEMATOCRIT, BLOOD
HCT: 21.8 % — ABNORMAL LOW (ref 39.0–52.0)
Hemoglobin: 7.3 g/dL — ABNORMAL LOW (ref 13.0–17.0)

## 2022-11-09 LAB — HEPATITIS PANEL, ACUTE
HCV Ab: NONREACTIVE
Hep A IgM: NONREACTIVE
Hep B C IgM: NONREACTIVE
Hepatitis B Surface Ag: REACTIVE — AB

## 2022-11-09 LAB — HIV ANTIBODY (ROUTINE TESTING W REFLEX): HIV Screen 4th Generation wRfx: NONREACTIVE

## 2022-11-09 MED ORDER — SODIUM CHLORIDE 0.9% IV SOLUTION: Freq: Once | INTRAVENOUS | Status: AC

## 2022-11-09 NOTE — Assessment & Plan Note (Signed)
Patient with pelvic lucency on CT.  Additionally has hypercalcemia, anemia, and new onset renal disease. - UPEP, SPEP, multiple other autoimmune labs pending - Nephrology consulted, appreciate recommendations - Will continue to monitor and treat anemia - If results very suspicion for multiple myeloma will consult oncology to discuss disposition for patient and whether transfer or outpatient follow-up with oncology would be recommended for his case.

## 2022-11-09 NOTE — Plan of Care (Signed)
  Problem: Clinical Measurements: Goal: Ability to maintain clinical measurements within normal limits will improve Outcome: Progressing Goal: Will remain free from infection Outcome: Progressing Goal: Diagnostic test results will improve Outcome: Progressing Goal: Respiratory complications will improve Outcome: Progressing   Problem: Activity: Goal: Risk for activity intolerance will decrease Outcome: Progressing   Problem: Coping: Goal: Level of anxiety will decrease Outcome: Progressing   Problem: Pain Managment: Goal: General experience of comfort will improve Outcome: Progressing   Problem: Safety: Goal: Ability to remain free from injury will improve Outcome: Progressing   

## 2022-11-09 NOTE — Progress Notes (Signed)
Daily Progress Note Intern Pager: [phone]  Patient name: Drew Cooper Medical record number: [id number] Date of birth: 09/25/1965 Age: 57 y.o. Gender: male  Primary Care Provider: Patient, No Pcp Per Consultants: Nephrology  Code Status: Full code as confirmed with patient   Pt Overview and Major Events to Date:  8/10 - Admitted, transfusion 2 uPRBC  8/11 - 1 uPRBC transfused   Assessment and Plan: Patient is 57 year old male without significant past medical history and limited interaction with the healthcare system who presented with fatigue and shortness of breath.  Found to have significant anemia and kidney disease now stable after multiple blood transfusions.  [facility]     * (Principal) CKD (chronic kidney disease)     Could be secondary to multiple myeloma versus primary kidney disease.   Edema resolved after Lasix. -Nephrology following, appreciate recommendations -Discontinued Lasix for today -Follow-up investigative nephrology labs: ANA, ANCA titers, ASO titer,  C3/C4, SPEP, light chain labs - Follow-up 24-hour urine protein        Anemia     Suspect chronic in nature given physical exam.  Most likely etiology  points to chronic kidney failure and/or multiple myeloma.  6.3 despite 2  units of packed red blood cells yesterday. -Transfuse 1u PRBC, post H&H 2 hours after -Transfusion threshold <7 -AM CBC        Concern for Multiple myeloma [facility])     Patient with pelvic lucency on CT.  Additionally has hypercalcemia,  anemia, and new onset renal disease. - UPEP, SPEP, multiple other autoimmune labs pending - Nephrology consulted, appreciate recommendations - Will continue to monitor and treat anemia - If results very suspicion for multiple myeloma will consult oncology to  discuss disposition for patient and whether transfer or outpatient  follow-up with oncology would be recommended for his case.       FEN/GI: Regular PPx: VTE  prophylaxis in the setting of anemia requiring blood transfusions Dispo: Most likely home after clinical improvement  Subjective:  Patient says he feels fine says that his edema has greatly improved.  Denies shortness of breath.  Objective: Temp:  [98.1 F (36.7 C)-98.8 F (37.1 C)] 98.4 F (36.9 C) (08/11 1233) Pulse Rate:  [70-95] 70 (08/11 1233) Resp:  [14-23] 17 (08/11 1233) BP: (119-145)/(69-85) 145/85 (08/11 1233) SpO2:  [97 %-100 %] 99 % (08/11 1233) Weight:  [82.6 kg] 82.6 kg (08/10 2217) Physical Exam: General: Well-appearing, in no acute distress Cardiovascular: RRR, cap refill 2 to 3 seconds, feet slightly cold, and pale Respiratory: Normal work of breathing, CTAB Abdomen: Nontender, nondistended, soft Extremities: No bilateral lower extremity edema  Laboratory: Most recent CBC Lab Results  Component Value Date   WBC 8.7 11/09/2022   HGB 6.3 (LL) 11/09/2022   HCT 19.3 (L) 11/09/2022   MCV 98.5 11/09/2022   PLT 217 11/09/2022   Most recent BMP    Latest Ref Rng & Units 11/09/2022    7:34 AM  BMP  Glucose 70 - 99 mg/dL 90   BUN 6 - 20 mg/dL 70   Creatinine 3.32 - 1.24 mg/dL 9.51   Sodium 884 - 166 mmol/L 136   Potassium 3.5 - 5.1 mmol/L 4.3   Chloride 98 - 111 mmol/L 98   CO2 22 - 32 mmol/L 21   Calcium 8.9 - 10.3 mg/dL 9.6     Lockie Mola, MD 11/09/2022, 2:51 PM  PGY-2, [facility] FPTS Intern pager: [phone],  text pages welcome Secure chat group [facility] Teaching Service

## 2022-11-09 NOTE — Progress Notes (Addendum)
[facility] Progress Note  Subjective: 2.9 L UOP since admission yesterday. Creat 8.9 today.   Vitals:   11/09/22 0400 11/09/22 1010 11/09/22 1025 11/09/22 1233  BP: 138/80 (!) 144/85 (!) 144/85 (!) 145/85  Pulse: 72 88 88 70  Resp: 18 20 20 17   Temp: 98.1 F (36.7 C) 98.1 F (36.7 C) 98.6 F (37 C) 98.4 F (36.9 C)  TempSrc: Oral Oral Oral Oral  SpO2: 98%  98% 99%  Weight:      Height:        Exam: Gen alert, no distress No jvd or bruits Chest clear bilat to bases RRR no MRG Abd soft ntnd no mass or ascites +bs Ext trace pretib edema Neuro is alert, Ox 3 , nf, no asterixis      Home meds include - meloxicam, tylenol, lidocaine patch    BP's initial 171/102 --> now 127/ 76   HR 88  RR 20     CXR - IMPRESSION: Mild diffuse prominence of the parahilar interstitial markings, differential includes mild pulmonary edema versus atypical/viral infection.    CT abd noncontrast -->  IMPRESSION: Multiple subtle lucencies within the iliac bones including 12 mm lesion within the LEFT iliac bone on image 60/3. Small lesion on image 58 the same bone. Similar lesions in the RIGHT iliac bone on image 60/3. Mild permeative pattern in the superior border of the iliac wings.  O/w no acute findings in abd / pelvis.      Dec 2023 --> creat 2.21 in epic     Renal US - 10.6/ 11.3 cm, no hydronephrosis     UP/C ratio - 5.6 gm     UA 8/10 - mod Hb, prot 30, rare bact, 0-5 rbc/wbc/epi     UNa 44,  UCr 35   Assessment/ Plan: Renal failure - in patient w/o much in the way of chronic medical conditions, not taking any medications. Not sure how acute this is since labs from dec 2023 showed elevation at 2.2.  Creat here was 9.0 on admission, w/ associate vol overload, and mild uremic symptoms (nausea). CT showed no obstruction. UA is unremarkable. UP/C ratio is high at 5.6 which doesn't correspond with the 30 protein in the dipstick UA - will get 24 hrs urine for protein. Urine lytes are  c/w renal failure. Anemia here is severe and there are some lucent lesions in the pelvic bones which could be due to multiple myeloma. Total prot is not high, but corrected Ca is a bit high. He has been taking nsaids for 4 wks but unlikely that this would do this much damage in that short of time. GN less likely with UA results. Serologies are ordered along w/ myeloma testing, awaiting results. Will get myeloma xray survey. He is not grossly uremic and HD is not needed at this time.  Vol overload - early IS edema on CXR and LE edema. Started IV lasix 80 bid, had 2.6 UOP and looks euvolemic today on exam. Will dc IV lasix.  BP - stable BP's here Anemia, severe - Hb 4.8 on admission. SP 2u prbc's 8/10. Hb up to 6.3.      [name] MD  [facility] 11/09/2022, 12:45 PM  Recent Labs  Lab 11/08/22 1045 11/09/22 0734  HGB 4.8* 6.3*  ALBUMIN 2.9* 2.7*  CALCIUM 10.1 9.6  PHOS  --  8.2*  CREATININE 9.15* 8.98*  K 4.4 4.3   No results for input(s): "IRON", "TIBC", "FERRITIN" in the last  168 hours. Inpatient medications:  furosemide  80 mg Intravenous Q12H

## 2022-11-10 ENCOUNTER — Encounter (HOSPITAL_COMMUNITY): Payer: Self-pay | Admitting: Student

## 2022-11-10 DIAGNOSIS — C9000 Multiple myeloma not having achieved remission: Secondary | ICD-10-CM | POA: Diagnosis not present

## 2022-11-10 DIAGNOSIS — N179 Acute kidney failure, unspecified: Secondary | ICD-10-CM | POA: Diagnosis not present

## 2022-11-10 DIAGNOSIS — R768 Other specified abnormal immunological findings in serum: Secondary | ICD-10-CM

## 2022-11-10 DIAGNOSIS — M899 Disorder of bone, unspecified: Secondary | ICD-10-CM | POA: Diagnosis not present

## 2022-11-10 DIAGNOSIS — D649 Anemia, unspecified: Secondary | ICD-10-CM | POA: Diagnosis not present

## 2022-11-10 LAB — KAPPA/LAMBDA LIGHT CHAINS
Kappa free light chain: 25.5 mg/L — ABNORMAL HIGH (ref 3.3–19.4)
Kappa, lambda light chain ratio: 0 — ABNORMAL LOW (ref 0.26–1.65)

## 2022-11-10 LAB — LACTATE DEHYDROGENASE: LDH: 213 U/L — ABNORMAL HIGH (ref 98–192)

## 2022-11-10 LAB — ANCA TITERS
Atypical P-ANCA titer: <1:20 {titer}
C-ANCA: <1:20 {titer}
P-ANCA: <1:20 {titer}

## 2022-11-10 LAB — HEMOGLOBIN A1C
Hgb A1c MFr Bld: 5.9 % — ABNORMAL HIGH (ref 4.8–5.6)
Mean Plasma Glucose: 123 mg/dL

## 2022-11-10 LAB — ANTISTREPTOLYSIN O TITER: ASO: <20 [IU]/mL (ref 0.0–200.0)

## 2022-11-10 LAB — C4 COMPLEMENT: Complement C4, Body Fluid: 27 mg/dL (ref 12–38)

## 2022-11-10 LAB — GLOMERULAR BASEMENT MEMBRANE ANTIBODIES: GBM Ab: <0.2 U (ref 0.0–0.9)

## 2022-11-10 LAB — HEPATITIS B SURFACE ANTIBODY,QUALITATIVE: Hep B S Ab: NONREACTIVE

## 2022-11-10 LAB — C3 COMPLEMENT: C3 Complement: 78 mg/dL — ABNORMAL LOW (ref 82–167)

## 2022-11-10 MED ORDER — ENOXAPARIN SODIUM 30 MG/0.3ML IJ SOSY
30.0000 mg | PREFILLED_SYRINGE | INTRAMUSCULAR | Status: DC
  Administered 2022-11-10: 30 mg via SUBCUTANEOUS
  Filled 2022-11-10: qty 0.3

## 2022-11-10 NOTE — Progress Notes (Signed)
Daily Progress Note Intern Pager: [phone]  Patient name: Drew Cooper Medical record number: [id number] Date of birth: Sep 24, 1965 Age: 57 y.o. Gender: male  Primary Care Provider: Patient, No Pcp Per Consultants: nephrology Code Status: Full code  Pt Overview and Major Events to Date:  8/10 - Admitted with creatinine of 9.15 and Hgb 4.8, transfusion 2 uPRBC  8/11 - 1 uPRBC transfused- post tx H&H 7.3  Assessment and Plan: Drew Cooper is a 58 year-old male with no significant medical history and limited interaction with the healthcare system who presented to the hospital with fatigue and dyspnea. He was found to have significant anemia and markedly elevated creatinine on admission. He is currently stable after multiple blood transfusions and is undergoing workup for possible multiple myeloma.  [facility]     * (Principal) CKD (chronic kidney disease)     Could be secondary to multiple myeloma versus primary kidney disease.   Edema resolved after Lasix. Documentation from PCP (in care everywhere)  shows creatinine of 5.07, BUN 49, eGFR 12, calcium 11 (corrected) in May  2024. - Nephrology following, appreciate recs - Discontinued Lasix per nephrology -Follow-up investigative nephrology labs: ANA, ANCA titers, ASO titer,  C3/C4, SPEP, light chain labs - Follow-up 24-hour urine protein        Anemia     Suspect chronic in nature given physical exam.  Most likely etiology  points to chronic kidney failure and/or multiple myeloma.  Hgb 6.3 after 2  units of packed red blood cells on 8/10.  -was on continuous telemetry in setting of severe anemia on admission -  d/c vs continue this?? -Received 1u PRBC in AM 8/11- Hgb 7.7 this morning (8/12) -Transfusion threshold <7 -continue to monitor        Concern for Multiple myeloma [facility])     Patient with pelvic lucency on CT.  Additionally has hypercalcemia,  anemia, and new onset renal disease. - UPEP, SPEP, multiple  other autoimmune labs pending - Nephrology consulted, appreciate recommendations - Will continue to monitor and treat anemia - If results very suspicion for multiple myeloma will consult oncology to  discuss disposition for patient and whether transfer or outpatient  follow-up with oncology would be recommended for his case.        Acute renal failure (HCC)     Lytic bone lesions on xray     Hepatitis B surface antigen positive     Hepatitis panel with positive HbS antigen, negative Hb core antibody -panel did not test for HbS antibody -ordered HbS antibody       FEN/GI: renal with fluid restriction PPx: lovenox 30mg  Dispo: pending clinical improvement  Subjective:  Pt states he slept well overnight, does not have any complaints. Denies pain anywhere in his body. Last bowel movement was earlier this morning. Understands that many labs are pending and nephrology is following closely.  Objective: Temp:  [97.8 F (36.6 C)-98.6 F (37 C)] 97.8 F (36.6 C) (08/12 0718) Pulse Rate:  [69-88] 75 (08/12 0718) Resp:  [17-20] 19 (08/12 0718) BP: (134-151)/(78-93) 151/88 (08/12 0718) SpO2:  [98 %-100 %] 100 % (08/12 0718) Weight:  [79.6 kg] 79.6 kg (08/12 0553)  Physical Exam Constitutional:      Appearance: Normal appearance.  Cardiovascular:     Rate and Rhythm: Normal rate and regular rhythm.     Heart sounds: Normal heart sounds.  Pulmonary:     Effort: Pulmonary effort is  normal.     Breath sounds: Normal air entry. Transmitted upper airway sounds present.     Comments: Honking noise with expiration at R middle lobe posteriorly Neurological:     Mental Status: He is alert.      Laboratory: Most recent CBC Lab Results  Component Value Date   WBC 10.0 11/10/2022   HGB 7.7 (L) 11/10/2022   HCT 22.8 (L) 11/10/2022   MCV 98.7 11/10/2022   PLT 213 11/10/2022   Most recent BMP    Latest Ref Rng & Units 11/10/2022    1:55 AM  BMP  Glucose 70 - 99 mg/dL 87    BUN 6 - 20 mg/dL 71   Creatinine 1.61 - 1.24 mg/dL 0.96   Sodium 045 - 409 mmol/L 136   Potassium 3.5 - 5.1 mmol/L 4.3   Chloride 98 - 111 mmol/L 98   CO2 22 - 32 mmol/L 19   Calcium 8.9 - 10.3 mg/dL 9.6     Other pertinent labs: HIV antibody negative HB surface antigen: reactive  Imaging/Diagnostic Tests: Metastatic bone survey: multiple lytic lesions in appendicular skeleton concerning for multiple myeloma or less likely metastatic disease  [name], MD 11/10/2022, 10:23 AM  PGY-1, [place] [facility] FPTS Intern pager: [phone], text pages welcome Secure chat group [facility] Teaching Service

## 2022-11-10 NOTE — Consult Note (Signed)
[facility] [phone]   [phone]  CONSULT NOTE  REFERRING PHYSICIAN: Dr. Denny Levy  REASON FOR CONSULTATION:  57 years old white male with highly suspicious multiple myeloma.  HPI ESHAAN Cooper is a 57 y.o. male with no significant past medical history except for kidney stone several months ago followed by Dr. Modena Slater with [facility].  The patient mentions that he has been complaining of increasing fatigue and weakness recently.  He presented to an urgent care center for evaluation and his blood work showed significant anemia with hemoglobin down to 4.8 and hematocrit 14.9.  The patient was immediately sent to the emergency department for further evaluation.  Comprehensive metabolic panel showed hyponatremia with sodium of 132.  He was also found to have significantly elevated serum creatinine of 9.15.  He has normal serum calcium of 10.1 but albumin was low at 2.9.  The patient received PRBCs transfusion.  Stool for Hemoccult was negative.  He has elevated serum lipase.  His protein creatinine ratio was elevated at 5.66.  Acute hepatitis panel showed positive hepatitis B surface antigen with normal hepatitis B surface antibody.  He had CT scan of the abdomen and pelvis on the day of his admission and that showed Supple lucent lesions in the pelvic bones suspicious for myeloma.  No other acute findings in the abdomen or pelvis.  He was seen by nephrology and ultrasound renal showed bilateral echogenic kidneys may be secondary to medical renal disease.  He also had skeletal bone survey performed yesterday and that showed multiple lytic lesions in the appendicular skeleton concerning for multiple myeloma or less likely metastatic disease.  Myeloma panel are still pending.  I was asked to see the patient today for evaluation of his suspicious multiple myeloma. When seen today he is feeling fine except for the fatigue.  He denied having any  current chest pain, shortness of breath, cough or hemoptysis.  He has no nausea, vomiting, diarrhea or constipation.  He has no headache or visual changes. Family history significant for mother died several years ago with history of diabetes mellitus and congestive heart failure.  Father is still alive. The patient is married and has 3 stepchildren.  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  He has a history of smoking for around 36 years and quit 10 months ago.  He also has a history of alcohol abuse but not recently and no history of drug abuse.  HPI  History reviewed. No pertinent past medical history.  Past Surgical History:  Procedure Laterality Date   KNEE SURGERY      Family History  Problem Relation Age of Onset   Hypertension Mother    Heart failure Mother    Diabetes Mother     Social History Social History   Tobacco Use   Smoking status: Former    Types: Cigarettes    Start date: 1986   Smokeless tobacco: Never  Vaping Use   Vaping status: Never Used  Substance Use Topics   Alcohol use: Yes    Comment: 2 beers per week   Drug use: No    No Known Allergies  Current Facility-Administered Medications  Medication Dose Route Frequency Provider Last Rate Last Admin   enoxaparin (LOVENOX) injection 30 mg  30 mg Subcutaneous Q24H Hindel, Leah, MD   30 mg at 11/10/22 1048    Review of Systems  Constitutional: positive for  fatigue Eyes: negative Ears, nose, mouth, throat, and face: negative Respiratory: negative Cardiovascular: negative Gastrointestinal: negative Genitourinary:negative Integument/breast: negative Hematologic/lymphatic: negative Musculoskeletal:negative Neurological: negative  Physical Exam  GNF:AOZHY, healthy, no distress, well nourished, and well developed SKIN: skin color, texture, turgor are normal, no rashes or significant lesions HEAD: Normocephalic, No masses, lesions, tenderness or  abnormalities EYES: normal, PERRLA, Conjunctiva are pink and non-injected EARS: External ears normal, Canals clear OROPHARYNX:no exudate, no erythema, and lips, buccal mucosa, and tongue normal  NECK: supple, no adenopathy, no JVD LYMPH:  no palpable lymphadenopathy, no hepatosplenomegaly LUNGS: clear to auscultation , and palpation HEART: regular rate & rhythm, no murmurs, and no gallops ABDOMEN:abdomen soft, non-tender, normal bowel sounds, and no masses or organomegaly BACK: Back symmetric, no curvature., No CVA tenderness EXTREMITIES:no joint deformities, effusion, or inflammation, no edema  NEURO: alert & oriented x 3 with fluent speech, no focal motor/sensory deficits  PERFORMANCE STATUS: ECOG 1  LABORATORY DATA: Lab Results  Component Value Date   WBC 10.0 11/10/2022   HGB 7.7 (L) 11/10/2022   HCT 22.8 (L) 11/10/2022   MCV 98.7 11/10/2022   PLT 213 11/10/2022    @LASTCHEM @  RADIOGRAPHIC STUDIES: DG Bone Survey Met  Result Date: 11/09/2022 CLINICAL DATA:  Renal failure, lytic lesion of hip EXAM: METASTATIC BONE SURVEY COMPARISON:  Partial comparison to CT abdomen/pelvis dated 11/08/2022 FINDINGS: No definite lytic calvarial lesions. Mild degenerative changes of the lower cervical spine, without definite lytic lesions. Mild compression fracture deformities of multiple midthoracic vertebral bodies, without definite lytic lesions. Degenerative changes. Mild degenerative changes of the lumbar spine, without definite lytic lesions. Small lytic lesions in the bilateral humeri. Small lytic lesion in the left proximal ulna. No lytic lesions in the right forearm. Mild increased interstitial markings in the lungs, without focal consolidation. The heart is normal in size. Thoracic aortic atherosclerosis. Suspected small pleural effusions on the lateral view (excluded on the frontal radiograph). Known lytic lesions in the iliac bones, including the right inferior pubic ramus, although better  evaluated on CT. Multiple lytic lesions in the femurs bilaterally. No definite lytic lesions in the bilateral tibia/fibula. IMPRESSION: Multiple lytic lesions in the appendicular skeleton, as above, concerning for multiple myeloma or less likely metastatic disease. Electronically Signed   By: Charline Bills M.D.   On: 11/09/2022 19:03   US RENAL  Result Date: 11/08/2022 CLINICAL DATA:  Acute kidney injury. EXAM: RENAL / URINARY TRACT ULTRASOUND COMPLETE COMPARISON:  None Available. FINDINGS: Right Kidney: Renal measurements: 10.6 cm x 4.1 cm x 4.5 cm = volume: 103 mL. Diffusely increased echogenicity of the renal parenchyma is noted. No mass or hydronephrosis visualized. Left Kidney: Renal measurements: 11.3 cm x 4.3 cm x 4.2 cm = volume: 105 mL. Diffusely increased echogenicity of the renal parenchyma is noted. No mass or hydronephrosis visualized. Bladder: Appears normal for degree of bladder distention. Other: None. IMPRESSION: Bilateral echogenic kidneys which may be secondary to medical renal disease. Electronically Signed   By: Aram Candela M.D.   On: 11/08/2022 18:44   CT ABDOMEN PELVIS WO CONTRAST  Result Date: 11/08/2022 CLINICAL DATA:  Epigastric pain EXAM: CT ABDOMEN AND PELVIS WITHOUT CONTRAST TECHNIQUE: Multidetector CT imaging of the abdomen and pelvis was performed following the standard protocol without IV contrast. RADIATION DOSE REDUCTION: This exam was performed according to the departmental dose-optimization program which includes automated exposure control, adjustment of the mA and/or kV according to patient size and/or use of iterative reconstruction technique. COMPARISON:  None Available. FINDINGS:  Lower chest: Bilateral small pleural effusions. Hepatobiliary: No focal hepatic lesion. Normal gallbladder. No biliary duct dilatation. Common bile duct is normal. Pancreas: Pancreas is normal. No ductal dilatation. No pancreatic inflammation. Spleen: Normal spleen Adrenals/urinary  tract: Adrenal glands normal. Nonobstructing calculi in the RIGHT kidney. Ureters and bladder normal. Stomach/Bowel: Stomach, small bowel, appendix, and cecum are normal. The colon and rectosigmoid colon are normal. Vascular/Lymphatic: Abdominal aorta is normal caliber with atherosclerotic calcification. There is no retroperitoneal or periportal lymphadenopathy. No pelvic lymphadenopathy. Reproductive: Prostate unremarkable Other: No free fluid. Musculoskeletal: Multiple subtle lucencies within the iliac bones including 12 mm lesion within the LEFT iliac bone on image 60/3. Small lesion on image 58 the same bone. Similar lesions in the RIGHT iliac bone on image 60/3. Mild permeative pattern in the superior border of the iliac wings. IMPRESSION: Subtle lucent lesions in the pelvic bones. Recommend correlation with myeloma serology. No acute findings abdomen pelvis. Bilateral pleural effusions. Electronically Signed   By: Genevive Bi M.D.   On: 11/08/2022 13:42   DG Chest Port 1 View  Result Date: 11/08/2022 CLINICAL DATA:  Dyspnea, anemia EXAM: PORTABLE CHEST 1 VIEW COMPARISON:  11/16/2007 chest radiograph. FINDINGS: Stable cardiomediastinal silhouette with normal heart size. No pneumothorax. No pleural effusion. Mild diffuse prominence of the parahilar interstitial markings. IMPRESSION: Mild diffuse prominence of the parahilar interstitial markings, differential includes mild pulmonary edema versus atypical/viral infection. Electronically Signed   By: Delbert Phenix M.D.   On: 11/08/2022 13:28    ASSESSMENT: This is a very pleasant 57 years old white male with highly suspicious multiple myeloma pending myeloma panel and bone marrow biopsy and aspirate for confirmation.  He presented with acute renal failure.   PLAN: I had a lengthy discussion with the patient today about his current condition and further investigation to confirm his diagnosis as well as possible treatment options. I ordered a bone  marrow biopsy and aspirate to be performed as soon as possible for confirmation of the multiple myeloma. I will also wait for the myeloma panel including serum light chain as well as quantitative immunoglobin with immunofixation. Once his diagnosis is confirmed, I may consider The patient for treatment with subcutaneous Velcade and Decadron until improvement of his renal function before adding additional chemotherapy agent daratumumab and Revlimid. For the anemia, will continue with PRBCs transfusion to keep his hemoglobin above 8. I will arrange for the patient a follow-up appointment with me at the [facility] within few days after discharge for more detailed discussion of his treatment options. He may also need a whole-body PET scan after discharge. For the insufficiency, he will continue his follow-up visit and evaluation by nephrology. The patient voices understanding of current disease status and treatment options and is in agreement with the current care plan.  All questions were answered. The patient knows to call the clinic with any problems, questions or concerns. We can certainly see the patient much sooner if necessary.  Thank you so much for allowing me to participate in the care of Drew Cooper. I will continue to follow up the patient with you and assist in his care.  Disclaimer: This note was dictated with voice recognition software. Similar sounding words can inadvertently be transcribed and may not be corrected upon review.   Lajuana Matte November 10, 2022, 3:03 PM

## 2022-11-10 NOTE — Assessment & Plan Note (Signed)
Hepatitis panel with positive HbS antigen, negative Hb core antibody -panel did not test for HbS antibody -ordered HbS antibody

## 2022-11-10 NOTE — Plan of Care (Signed)
POC initiated and progressing. 

## 2022-11-10 NOTE — Assessment & Plan Note (Addendum)
Suspect chronic in nature given physical exam.  Most likely etiology points to chronic kidney failure and/or multiple myeloma.  Hgb 6.3 after 2 units of packed red blood cells on 8/10.  -was on continuous telemetry in setting of severe anemia on admission - d/c vs continue this?? -Received 1u PRBC in AM 8/11- Hgb 7.7 this morning (8/12) -Transfusion threshold <7 -continue to monitor

## 2022-11-10 NOTE — Progress Notes (Signed)
[facility] Progress Note   Home meds include - meloxicam, tylenol, lidocaine patch    BP's initial 171/102 --> now 127/ 76   HR 88  RR 20     CXR - IMPRESSION: Mild diffuse prominence of the parahilar interstitial markings, differential includes mild pulmonary edema versus atypical/viral infection.    CT abd noncontrast -->  IMPRESSION: Multiple subtle lucencies within the iliac bones including 12 mm lesion within the LEFT iliac bone on image 60/3. Small lesion on image 58 the same bone. Similar lesions in the RIGHT iliac bone on image 60/3. Mild permeative pattern in the superior border of the iliac wings.  O/w no acute findings in abd / pelvis.      Dec 2023 --> creat 2.21 in epic     Renal US - 10.6/ 11.3 cm, no hydronephrosis     UP/C ratio - 5.6 gm     UA 8/10 - mod Hb, prot 30, rare bact, 0-5 rbc/wbc/epi     UNa 44,  UCr 35   Assessment/ Plan: Renal failure - in patient w/o much in the way of chronic medical conditions, not taking any medications. Not sure how acute this is since labs from dec 2023 showed elevation at 2.2.  Creat here was 9.0 on admission, w/ associate vol overload, and mild uremic symptoms (nausea). CT showed no obstruction. UA is unremarkable. UP/C ratio is high at 5.6 which doesn't correspond with the 30 protein in the dipstick UA - will get 24 hrs urine for protein. Urine lytes are c/w renal failure. Anemia here is severe and there are some lucent lesions in the pelvic bones which could be due to multiple myeloma. Total prot is not high, but corrected Ca is a bit high. He has been taking nsaids for 4 wks but unlikely that this would do this much damage in that short of time and not c/w membranous nephropathy. GN less likely with UA results. - Serologies have all been ordered along w/ myeloma testing, awaiting results. He is not grossly uremic and HD is not needed at this time but certainly possible in the next few days. - I would wait a few days for the  results and have oncology see the patient to decide on therapy. Patient also very hesitant on going home without any answers. In addition he has not ambulated in the hallway + renal function not improving.   Vol overload - early IS edema on CXR and LE edema. Started IV lasix 80 bid, looks euvolemic today on exam.  BP - stable BP's here Anemia, severe - Hb 4.8 on admission. SP 2u prbc's 8/10. Hb up to 7.7  Subjective: 1.9L /24hrs but cr 9.26 today.   Vitals:   11/09/22 1920 11/09/22 2331 11/10/22 0553 11/10/22 0718  BP: (!) 146/78 136/80 (!) 146/93 (!) 151/88  Pulse:    75  Resp:    19  Temp: 98.1 F (36.7 C) 97.9 F (36.6 C) 97.9 F (36.6 C) 97.8 F (36.6 C)  TempSrc: Oral Oral Oral Oral  SpO2:    100%  Weight:   79.6 kg   Height:        Exam: Gen alert, no distress No jvd or bruits Chest clear bilat to bases RRR no MRG Abd soft ntnd no mass or ascites +bs Ext trace pretib edema Neuro is alert, Ox 3 , nf, no asterixis       Recent Labs  Lab 11/09/22 0734 11/09/22 1603 11/10/22 0155  HGB 6.3* 7.3* 7.7*  ALBUMIN 2.7*  --  2.7*  CALCIUM 9.6  --  9.6  PHOS 8.2*  --  9.0*  CREATININE 8.98*  --  9.26*  K 4.3  --  4.3   No results for input(s): "IRON", "TIBC", "FERRITIN" in the last 168 hours. Inpatient medications:  enoxaparin (LOVENOX) injection  30 mg Subcutaneous Q24H

## 2022-11-10 NOTE — Assessment & Plan Note (Signed)
Patient with pelvic lucency on CT.  Additionally has hypercalcemia, anemia, and new onset renal disease. - UPEP, SPEP, multiple other autoimmune labs pending - Nephrology consulted, appreciate recommendations - Will continue to monitor and treat anemia - If results very suspicion for multiple myeloma will consult oncology to discuss disposition for patient and whether transfer or outpatient follow-up with oncology would be recommended for his case.

## 2022-11-10 NOTE — Assessment & Plan Note (Signed)
Could be secondary to multiple myeloma versus primary kidney disease.  Edema resolved after Lasix. Documentation from PCP (in care everywhere) shows creatinine of 5.07, BUN 49, eGFR 12, calcium 11 (corrected) in May 2024. - Nephrology following, appreciate recs - Discontinued Lasix per nephrology -Follow-up investigative nephrology labs: ANA, ANCA titers, ASO titer, C3/C4, SPEP, light chain labs - Follow-up 24-hour urine protein

## 2022-11-11 ENCOUNTER — Inpatient Hospital Stay (HOSPITAL_COMMUNITY): Payer: BC Managed Care – PPO

## 2022-11-11 DIAGNOSIS — N185 Chronic kidney disease, stage 5: Secondary | ICD-10-CM | POA: Diagnosis not present

## 2022-11-11 LAB — HEMOGLOBIN AND HEMATOCRIT, BLOOD
HCT: 27.7 % — ABNORMAL LOW (ref 39.0–52.0)
Hemoglobin: 9 g/dL — ABNORMAL LOW (ref 13.0–17.0)

## 2022-11-11 LAB — PREPARE RBC (CROSSMATCH)

## 2022-11-11 LAB — ANTINUCLEAR ANTIBODIES, IFA: ANA Ab, IFA: NEGATIVE

## 2022-11-11 MED ORDER — FENTANYL CITRATE (PF) 100 MCG/2ML IJ SOLN
INTRAMUSCULAR | Status: AC
  Filled 2022-11-11: qty 4

## 2022-11-11 MED ORDER — MIDAZOLAM HCL 2 MG/2ML IJ SOLN
INTRAMUSCULAR | Status: AC
  Filled 2022-11-11: qty 4

## 2022-11-11 MED ORDER — LIDOCAINE HCL 1 % IJ SOLN
10.0000 mL | Freq: Once | INTRAMUSCULAR | Status: AC
  Administered 2022-11-11: 10 mL via INTRADERMAL
  Filled 2022-11-11: qty 10

## 2022-11-11 MED ORDER — SODIUM CHLORIDE 0.9% IV SOLUTION: Freq: Once | INTRAVENOUS | Status: DC

## 2022-11-11 MED ORDER — FENTANYL CITRATE (PF) 100 MCG/2ML IJ SOLN
INTRAMUSCULAR | Status: AC | PRN
  Administered 2022-11-11: 25 ug via INTRAVENOUS
  Administered 2022-11-11: 50 ug via INTRAVENOUS

## 2022-11-11 MED ORDER — MIDAZOLAM HCL 2 MG/2ML IJ SOLN
INTRAMUSCULAR | Status: AC | PRN
  Administered 2022-11-11: 1 mg via INTRAVENOUS
  Administered 2022-11-11: .5 mg via INTRAVENOUS

## 2022-11-11 MED ORDER — ENOXAPARIN SODIUM 40 MG/0.4ML IJ SOSY
40.0000 mg | PREFILLED_SYRINGE | INTRAMUSCULAR | Status: DC
  Administered 2022-11-11: 40 mg via SUBCUTANEOUS
  Filled 2022-11-11: qty 0.4

## 2022-11-11 NOTE — Progress Notes (Signed)
   11/11/22 1259  TOC Brief Assessment  Insurance and Status Reviewed  Patient has primary care physician Yes ([facility], added to AVS)  Home environment has been reviewed single family home  Prior level of function: self care  Prior/Current Home Services No current home services  Social Determinants of Health Reivew SDOH reviewed no interventions necessary  Readmission risk has been reviewed Yes  Transition of care needs no transition of care needs at this time

## 2022-11-11 NOTE — Plan of Care (Signed)
  Problem: Clinical Measurements: Goal: Ability to maintain clinical measurements within normal limits will improve Outcome: Progressing Goal: Will remain free from infection Outcome: Progressing Goal: Diagnostic test results will improve Outcome: Progressing Goal: Respiratory complications will improve Outcome: Progressing   Problem: Activity: Goal: Risk for activity intolerance will decrease Outcome: Progressing   Problem: Coping: Goal: Level of anxiety will decrease Outcome: Progressing   Problem: Pain Managment: Goal: General experience of comfort will improve Outcome: Progressing   Problem: Safety: Goal: Ability to remain free from injury will improve Outcome: Progressing   Problem: Education: Goal: Knowledge of General Education information will improve Description: Including pain rating scale, medication(s)/side effects and non-pharmacologic comfort measures Outcome: Progressing   Problem: Health Behavior/Discharge Planning: Goal: Ability to manage health-related needs will improve Outcome: Progressing   Problem: Clinical Measurements: Goal: Ability to maintain clinical measurements within normal limits will improve Outcome: Progressing Goal: Will remain free from infection Outcome: Progressing Goal: Diagnostic test results will improve Outcome: Progressing Goal: Respiratory complications will improve Outcome: Progressing Goal: Cardiovascular complication will be avoided Outcome: Progressing   Problem: Activity: Goal: Risk for activity intolerance will decrease Outcome: Progressing   Problem: Nutrition: Goal: Adequate nutrition will be maintained Outcome: Progressing   Problem: Coping: Goal: Level of anxiety will decrease Outcome: Progressing   Problem: Elimination: Goal: Will not experience complications related to bowel motility Outcome: Progressing Goal: Will not experience complications related to urinary retention Outcome: Progressing    Problem: Pain Managment: Goal: General experience of comfort will improve Outcome: Progressing   Problem: Safety: Goal: Ability to remain free from injury will improve Outcome: Progressing   Problem: Skin Integrity: Goal: Risk for impaired skin integrity will decrease Outcome: Progressing

## 2022-11-11 NOTE — Progress Notes (Signed)
PT Cancellation Note  Patient Details Name: IZAAN VANDERCOOK MRN: [id number] DOB: 28-Dec-1965   Cancelled Treatment:    Reason Eval/Treat Not Completed: PT screened, no needs identified, will sign off. Upon PT arrival pt is ambulating independently around the room. Pt denies mobility concerns at this time. Pt does not demonstrate the need for acute PT evaluation at this time. If mobility deficits arise please re-consult.   Arlyss Gandy 11/11/2022, 11:50 AM

## 2022-11-11 NOTE — Progress Notes (Addendum)
Daily Progress Note Intern Pager: [phone]  Patient name: Drew Cooper Medical record number: [id number] Date of birth: Mar 14, 1966 Age: 57 y.o. Gender: male  Primary Care Provider: Patient, No Pcp Per Consultants: nephrology, oncology Code Status: full code  Pt Overview and Major Events to Date:  8/10 - Admitted with creatinine of 9.15 and Hgb 4.8, transfusion 2 uPRBC. Nephro ordered multiple myeloma labs 8/11 - 1 uPRBC transfused- post tx H&H 7.3 8/12 - oncology consulted, ordered labs and bone marrow bx 8/13 - bone marrow biopsy, 1 uPRBC ordered for Hgb threshold <8   Assessment and Plan: Drew Cooper is a 57 year-old male with no significant medical history and limited interaction with the healthcare system who presented to the hospital with fatigue and dyspnea. He was found to have significant anemia and markedly elevated creatinine on admission. He is currently stable after multiple blood transfusions and is undergoing workup for possible multiple myeloma.   [facility]     * (Principal) CKD (chronic kidney disease)     Could be secondary to multiple myeloma versus primary kidney disease.   Edema resolved after Lasix. Documentation from PCP (in care everywhere)  shows creatinine of 5.07, BUN 49, eGFR 12, calcium 11 (corrected) in May  2024. - renal function slowly improving - Nephrology following, appreciate recs - Discontinued Lasix per nephrology -Follow-up investigative nephrology labs: ANA, IFA, beta 2 microglobulin  - Follow-up 24-hour urine protein        Anemia     Suspect chronic in nature given physical exam.  Most likely etiology  points to chronic kidney failure and/or multiple myeloma.  Hgb 6.3 after 2  units of packed red blood cells on 8/10. Received 1u PRBC in AM 8/11. Hgb  7.7 8/12 and 8/13 AM. Transfusion threshold <8 per oncology. -transfuse 1u pRBCs now -post transfusion H&H -continuous telemetry -Transfusion threshold <8 -continue to  monitor        Concern for Multiple myeloma [facility])     Patient with pelvic lucency on CT.  Additionally has hypercalcemia,  anemia, and new onset renal disease. Significantly elevated lambda free  light chain, elevated kappa light chain.  -- Bone marrow biopsy done today - UPEP, SPEP, multiple other autoimmune labs pending - Nephrology consulted, appreciate recommendations -- Oncology involved, ordered several tests to confirm diagnosis. Plan to  treat with chemotherapy after this. May need whole-body PET scan after  discharge  -- d/c'd lovenox ahead of bone marrow biopsy, restart after - Transfusion threshold <8         Acute renal failure (HCC)     Most likely secondary to multiple myeloma. Differential includes  glomerulonephritis, membranous nephropathy. Complement C3/C4 WNL,  glomerular basement membrane        Lytic bone lesions on xray    FEN/GI: NPO for bone marrow bx PPx: held lovenox for bone marrow bx Dispo: pending clinical improvement  Subjective:  Pt out of room for bone marrow biopsy  Objective: Temp:  [98.1 F (36.7 C)-98.6 F (37 C)] 98.4 F (36.9 C) (08/13 0745) Pulse Rate:  [70-81] 75 (08/13 1025) Resp:  [13-21] 15 (08/13 1025) BP: (131-162)/(81-96) 134/91 (08/13 1025) SpO2:  [97 %-100 %] 97 % (08/13 1025) Physical Exam: General: Resting comfortably in the bed in no acute distress Cardiovascular: Regular rate.  Normal S1 and S2 no murmurs rubs or gallops Pulmonary: Clear to auscultation bilaterally.  No wheezes rales or rhonchi. Abdomen: Bowel sounds present.  Soft, nondistended, nontender MSK: No edema to bilateral lower extremities  Laboratory: Most recent CBC Lab Results  Component Value Date   WBC 9.4 11/11/2022   HGB 7.7 (L) 11/11/2022   HCT 23.2 (L) 11/11/2022   MCV 99.1 11/11/2022   PLT 225 11/11/2022   Most recent BMP    Latest Ref Rng & Units 11/11/2022   12:56 AM  BMP  Glucose 70 - 99 mg/dL 89   BUN 6 - 20 mg/dL 66    Creatinine 8.46 - 1.24 mg/dL 9.62   Sodium 952 - 841 mmol/L 137   Potassium 3.5 - 5.1 mmol/L 4.2   Chloride 98 - 111 mmol/L 100   CO2 22 - 32 mmol/L 22   Calcium 8.9 - 10.3 mg/dL 9.7     Other labs:   Kappa free light chain: 25.5, lambda free light chain 28311.2  Complement C4 27, C3 78 Glomerular basement membrane <0.2 Antistreptolysin O titer <20 C-ANCA/P-ANCA <1:20  Imaging/Diagnostic Tests: None  [name], MD 11/11/2022, 11:58 AM  PGY-1, [facility] FPTS Intern pager: [phone], text pages welcome Secure chat group [facility] Teaching Service

## 2022-11-11 NOTE — Procedures (Signed)
Interventional Radiology Procedure Note  Procedure: CT BM ASP AND CORE    Complications: None  Estimated Blood Loss:  MIN  Findings: 11 G CORE AND ASP    M. TREVOR SHICK, MD    

## 2022-11-11 NOTE — Consult Note (Signed)
Chief Complaint: Concern for Multiple Myeloma  Referring Provider(s): Si Gaul  Supervising Physician: Marliss Coots  Patient Status: [facility] - In-pt  History of Present Illness: Drew Cooper is a 57 y.o. male with complaints of increasing fatigue and weakness recently.    He presented to an urgent care center for evaluation.  Labs showed significant anemia with hemoglobin down to 4.8 and hematocrit 14.9.    He was immediately sent to the emergency department for further evaluation.    He was also found to have significantly elevated serum creatinine of 9.15.    He had CT scan of the abdomen and pelvis showed lucent lesions in the pelvic bones suspicious for myeloma.    He also had skeletal bone survey performed yesterday and that showed multiple lytic lesions in the appendicular skeleton concerning for multiple myeloma or less likely metastatic disease.    We are asked to perform a bone marrow biopsy.  He is NPO. Other than profound fatigue, he has no other complaints.  Patient is Full Code  History reviewed. No pertinent past medical history.  Past Surgical History:  Procedure Laterality Date   KNEE SURGERY      Allergies: Patient has no known allergies.  Medications: Prior to Admission medications   Medication Sig Start Date End Date Taking? Authorizing Provider  acetaminophen (TYLENOL) 500 MG tablet Take 500 mg by mouth every 6 (six) hours as needed for moderate pain.   Yes [provider]  lidocaine (LIDODERM) 5 % Place 1 patch onto the skin daily. 05/24/22  Yes [provider]  meloxicam (MOBIC) 15 MG tablet Take 15 mg by mouth daily.   Yes [provider]     Family History  Problem Relation Age of Onset   Hypertension Mother    Heart failure Mother    Diabetes Mother     Social History   Socioeconomic History   Marital status: Married    Spouse name: Not on file   Number of children: Not on file    Years of education: Not on file   Highest education level: Not on file  Occupational History   Not on file  Tobacco Use   Smoking status: Former    Types: Cigarettes    Start date: 1986   Smokeless tobacco: Never  Vaping Use   Vaping status: Never Used  Substance and Sexual Activity   Alcohol use: Yes    Comment: 2 beers per week   Drug use: No   Sexual activity: Not on file  Other Topics Concern   Not on file  Social History Narrative   Not on file   Social Determinants of Health   Financial Resource Strain: Not on file  Food Insecurity: Not on file  Transportation Needs: Not on file  Physical Activity: Not on file  Stress: Not on file  Social Connections: Not on file     Review of Systems: A 12 point ROS discussed and pertinent positives are indicated in the HPI above.  All other systems are negative.    Vital Signs: BP (!) 158/86   Pulse 80   Temp 98.4 F (36.9 C) (Oral)   Resp 18   Ht 5\' 10"  (1.778 m)   Wt 175 lb 7.8 oz (79.6 kg)   SpO2 100%   BMI 25.18 kg/m   Advance Care Plan: The advanced care place/surrogate decision maker was discussed at the time of visit and the patient did not wish to  discuss or was not able to name a surrogate decision maker or provide an advance care plan.  Physical Exam Vitals reviewed.  Constitutional:      Appearance: Normal appearance.  HENT:     Head: Normocephalic and atraumatic.  Eyes:     Extraocular Movements: Extraocular movements intact.  Cardiovascular:     Rate and Rhythm: Normal rate and regular rhythm.  Pulmonary:     Effort: Pulmonary effort is normal. No respiratory distress.     Breath sounds: Normal breath sounds.  Abdominal:     General: There is no distension.     Palpations: Abdomen is soft.     Tenderness: There is no abdominal tenderness.  Musculoskeletal:        General: Normal range of motion.     Cervical back: Normal range of motion.  Skin:    General: Skin is warm and dry.   Neurological:     General: No focal deficit present.     Mental Status: He is alert and oriented to person, place, and time.  Psychiatric:        Mood and Affect: Mood normal.        Behavior: Behavior normal.        Thought Content: Thought content normal.        Judgment: Judgment normal.     Imaging: DG Bone Survey Met  Result Date: 11/09/2022 CLINICAL DATA:  Renal failure, lytic lesion of hip EXAM: METASTATIC BONE SURVEY COMPARISON:  Partial comparison to CT abdomen/pelvis dated 11/08/2022 FINDINGS: No definite lytic calvarial lesions. Mild degenerative changes of the lower cervical spine, without definite lytic lesions. Mild compression fracture deformities of multiple midthoracic vertebral bodies, without definite lytic lesions. Degenerative changes. Mild degenerative changes of the lumbar spine, without definite lytic lesions. Small lytic lesions in the bilateral humeri. Small lytic lesion in the left proximal ulna. No lytic lesions in the right forearm. Mild increased interstitial markings in the lungs, without focal consolidation. The heart is normal in size. Thoracic aortic atherosclerosis. Suspected small pleural effusions on the lateral view (excluded on the frontal radiograph). Known lytic lesions in the iliac bones, including the right inferior pubic ramus, although better evaluated on CT. Multiple lytic lesions in the femurs bilaterally. No definite lytic lesions in the bilateral tibia/fibula. IMPRESSION: Multiple lytic lesions in the appendicular skeleton, as above, concerning for multiple myeloma or less likely metastatic disease. Electronically Signed   By: Charline Bills M.D.   On: 11/09/2022 19:03   US RENAL  Result Date: 11/08/2022 CLINICAL DATA:  Acute kidney injury. EXAM: RENAL / URINARY TRACT ULTRASOUND COMPLETE COMPARISON:  None Available. FINDINGS: Right Kidney: Renal measurements: 10.6 cm x 4.1 cm x 4.5 cm = volume: 103 mL. Diffusely increased echogenicity of the  renal parenchyma is noted. No mass or hydronephrosis visualized. Left Kidney: Renal measurements: 11.3 cm x 4.3 cm x 4.2 cm = volume: 105 mL. Diffusely increased echogenicity of the renal parenchyma is noted. No mass or hydronephrosis visualized. Bladder: Appears normal for degree of bladder distention. Other: None. IMPRESSION: Bilateral echogenic kidneys which may be secondary to medical renal disease. Electronically Signed   By: Aram Candela M.D.   On: 11/08/2022 18:44   CT ABDOMEN PELVIS WO CONTRAST  Result Date: 11/08/2022 CLINICAL DATA:  Epigastric pain EXAM: CT ABDOMEN AND PELVIS WITHOUT CONTRAST TECHNIQUE: Multidetector CT imaging of the abdomen and pelvis was performed following the standard protocol without IV contrast. RADIATION DOSE REDUCTION: This exam was performed according to  the departmental dose-optimization program which includes automated exposure control, adjustment of the mA and/or kV according to patient size and/or use of iterative reconstruction technique. COMPARISON:  None Available. FINDINGS: Lower chest: Bilateral small pleural effusions. Hepatobiliary: No focal hepatic lesion. Normal gallbladder. No biliary duct dilatation. Common bile duct is normal. Pancreas: Pancreas is normal. No ductal dilatation. No pancreatic inflammation. Spleen: Normal spleen Adrenals/urinary tract: Adrenal glands normal. Nonobstructing calculi in the RIGHT kidney. Ureters and bladder normal. Stomach/Bowel: Stomach, small bowel, appendix, and cecum are normal. The colon and rectosigmoid colon are normal. Vascular/Lymphatic: Abdominal aorta is normal caliber with atherosclerotic calcification. There is no retroperitoneal or periportal lymphadenopathy. No pelvic lymphadenopathy. Reproductive: Prostate unremarkable Other: No free fluid. Musculoskeletal: Multiple subtle lucencies within the iliac bones including 12 mm lesion within the LEFT iliac bone on image 60/3. Small lesion on image 58 the same bone.  Similar lesions in the RIGHT iliac bone on image 60/3. Mild permeative pattern in the superior border of the iliac wings. IMPRESSION: Subtle lucent lesions in the pelvic bones. Recommend correlation with myeloma serology. No acute findings abdomen pelvis. Bilateral pleural effusions. Electronically Signed   By: Genevive Bi M.D.   On: 11/08/2022 13:42   DG Chest Port 1 View  Result Date: 11/08/2022 CLINICAL DATA:  Dyspnea, anemia EXAM: PORTABLE CHEST 1 VIEW COMPARISON:  11/16/2007 chest radiograph. FINDINGS: Stable cardiomediastinal silhouette with normal heart size. No pneumothorax. No pleural effusion. Mild diffuse prominence of the parahilar interstitial markings. IMPRESSION: Mild diffuse prominence of the parahilar interstitial markings, differential includes mild pulmonary edema versus atypical/viral infection. Electronically Signed   By: Delbert Phenix M.D.   On: 11/08/2022 13:28    Labs:  CBC: Recent Labs    11/08/22 1045 11/09/22 0734 11/09/22 1603 11/10/22 0155 11/11/22 0056  WBC 8.5 8.7  --  10.0 9.4  HGB 4.8* 6.3* 7.3* 7.7* 7.7*  HCT 14.9* 19.3* 21.8* 22.8* 23.2*  PLT 257 217  --  213 225    COAGS: No results for input(s): "INR", "APTT" in the last 8760 hours.  BMP: Recent Labs    11/08/22 1045 11/09/22 0734 11/10/22 0155 11/11/22 0056  NA 132* 136 136 137  K 4.4 4.3 4.3 4.2  CL 98 98 98 100  CO2 18* 21* 19* 22  GLUCOSE 119* 90 87 89  BUN 71* 70* 71* 66*  CALCIUM 10.1 9.6 9.6 9.7  CREATININE 9.15* 8.98* 9.26* 8.79*  GFRNONAA 6* 6* 6* 6*    LIVER FUNCTION TESTS: Recent Labs    03/08/22 1330 11/08/22 1045 11/09/22 0734 11/10/22 0155 11/11/22 0056  BILITOT 0.4 0.7  --   --   --   AST 33 45*  --   --   --   ALT 21 56*  --   --   --   ALKPHOS 78 65  --   --   --   PROT 6.9 7.0  --   --   --   ALBUMIN 3.6 2.9* 2.7* 2.7* 2.6*    TUMOR MARKERS: No results for input(s): "AFPTM", "CEA", "CA199", "CHROMGRNA" in the last 8760 hours.  Assessment and  Plan:  Profound anemia and lytic lesions concerning for Multiple Myeloma  Will proceed with image guided bone marrow biopsy today.  Risks and benefits of bone marrow was discussed with the patient and/or patient's family including, but not limited to bleeding, infection, damage to adjacent structures or low yield requiring additional tests.  All of the questions were answered and there is  agreement to proceed.  Consent signed and in chart.   Thank you for allowing our service to participate in Drew Cooper 's care.  Electronically Signed: Gwynneth Macleod, PA-C   11/11/2022, 8:16 AM      I spent a total of 20 Minutes  in face to face in clinical consultation, greater than 50% of which was counseling/coordinating care for bone marrow biopsy

## 2022-11-11 NOTE — Assessment & Plan Note (Addendum)
Patient with pelvic lucency on CT.  Additionally has hypercalcemia, anemia, and new onset renal disease. Significantly elevated lambda free light chain, elevated kappa light chain.  -- Bone marrow biopsy done today - UPEP, SPEP, multiple other autoimmune labs pending - Nephrology consulted, appreciate recommendations -- Oncology involved, ordered several tests to confirm diagnosis. Plan to treat with chemotherapy after this. May need whole-body PET scan after discharge  -- d/c'd lovenox ahead of bone marrow biopsy, restart after - Transfusion threshold <8

## 2022-11-11 NOTE — Progress Notes (Signed)
OT Cancellation Note  Patient Details Name: Drew Cooper MRN: [id number] DOB: 1965-10-07   Cancelled Treatment:    Reason Eval/Treat Not Completed: OT screened, no needs identified, will sign off (per PT, pt ind with mobility and with no acute OT needs, will s/o. Please reconsult if there is a change in pt status)  Carver Fila, OTD, OTR/L [facility] [phone]   Dalphine Handing 11/11/2022, 11:57 AM

## 2022-11-11 NOTE — Assessment & Plan Note (Addendum)
Suspect chronic in nature given physical exam.  Most likely etiology points to chronic kidney failure and/or multiple myeloma.  Hgb 6.3 after 2 units of packed red blood cells on 8/10. Received 1u PRBC in AM 8/11. Hgb 7.7 8/12 and 8/13 AM. Transfusion threshold <8 per oncology. -transfuse 1u pRBCs now -post transfusion H&H -continuous telemetry -Transfusion threshold <8 -continue to monitor

## 2022-11-11 NOTE — Assessment & Plan Note (Signed)
Hepatitis panel with positive HbS antigen, negative Hb core antibody -panel did not test for HbS antibody -HbS antibody negative

## 2022-11-11 NOTE — Assessment & Plan Note (Signed)
Most likely secondary to multiple myeloma. Differential includes glomerulonephritis, membranous nephropathy. Complement C3/C4 WNL, glomerular basement membrane

## 2022-11-11 NOTE — Progress Notes (Signed)
[facility] Progress Note   Home meds include - meloxicam, tylenol, lidocaine patch    BP's initial 171/102 --> now 127/ 76   HR 88  RR 20     CXR - IMPRESSION: Mild diffuse prominence of the parahilar interstitial markings, differential includes mild pulmonary edema versus atypical/viral infection.    CT abd noncontrast -->  IMPRESSION: Multiple subtle lucencies within the iliac bones including 12 mm lesion within the LEFT iliac bone on image 60/3. Small lesion on image 58 the same bone. Similar lesions in the RIGHT iliac bone on image 60/3. Mild permeative pattern in the superior border of the iliac wings.  O/w no acute findings in abd / pelvis.      Dec 2023 --> creat 2.21 in epic     Renal US - 10.6/ 11.3 cm, no hydronephrosis     UP/C ratio - 5.6 gm     UA 8/10 - mod Hb, prot 30, rare bact, 0-5 rbc/wbc/epi     UNa 44,  UCr 35   Assessment/ Plan: Renal failure - in patient w/o much in the way of chronic medical conditions, not taking any medications. Not sure how acute this is since labs from dec 2023 showed elevation at 2.2.  Creat here was 9.0 on admission, w/ associate vol overload, and mild uremic symptoms (nausea). CT showed no obstruction. UA is unremarkable. UP/C ratio is high at 5.6 which doesn't correspond with the 30 protein in the dipstick UA - will get 24 hrs urine for protein. Urine lytes are c/w renal failure. Anemia here is severe and there are some lucent lesions in the pelvic bones which could be due to multiple myeloma. Total prot is not high, but corrected Ca is a bit high. He has been taking nsaids for 4 wks but unlikely that this would do this much damage in that short of time and not c/w membranous nephropathy. GN less likely with UA results. - ASO, ANCA, C3C4, GBM all neg. Appears to be myeloma and appreciate oncology Dr. Arbutus Ped seeing the pt. S/p BMBx 8/13.   Renal appears to have stabilized and UOP is good.  From renal standpoint can f/u with office  in a week; oncology can contact if they would like TPE but doesn't appear so at this time.    Vol overload - early IS edema on CXR and LE edema. Started IV lasix 80 bid, looks euvolemic today on exam.  BP - stable BP's here Anemia, severe - Hb 4.8 on admission. SP 2u prbc's 8/10. Hb up to 7.7  Subjective: Cr 8.8 today and denies f/c/n/v/sob.   Vitals:   11/11/22 1010 11/11/22 1015 11/11/22 1020 11/11/22 1025  BP: (!) 146/89 133/86 (!) 134/91 (!) 134/91  Pulse: 79 70 76 75  Resp: 18 14 13 15   Temp:      TempSrc:      SpO2: 100% 100% 100% 97%  Weight:      Height:        Exam: Gen alert, no distress No jvd or bruits Chest clear bilat to bases RRR no MRG Abd soft ntnd no mass or ascites +bs Ext trace pretib edema Neuro is alert, Ox 3 , nf, no asterixis       Recent Labs  Lab 11/10/22 0155 11/11/22 0056  HGB 7.7* 7.7*  ALBUMIN 2.7* 2.6*  CALCIUM 9.6 9.7  PHOS 9.0* 8.8*  CREATININE 9.26* 8.79*  K 4.3 4.2   No results for input(s): "IRON", "TIBC", "FERRITIN"  in the last 168 hours. Inpatient medications:  sodium chloride   Intravenous Once   enoxaparin (LOVENOX) injection  40 mg Subcutaneous Q24H

## 2022-11-11 NOTE — Assessment & Plan Note (Addendum)
Could be secondary to multiple myeloma versus primary kidney disease.  Edema resolved after Lasix. Documentation from PCP (in care everywhere) shows creatinine of 5.07, BUN 49, eGFR 12, calcium 11 (corrected) in May 2024. - renal function slowly improving - Nephrology following, appreciate recs - Discontinued Lasix per nephrology -Follow-up investigative nephrology labs: ANA, IFA, beta 2 microglobulin  - Follow-up 24-hour urine protein

## 2022-11-12 DIAGNOSIS — N189 Chronic kidney disease, unspecified: Secondary | ICD-10-CM

## 2022-11-12 DIAGNOSIS — M47812 Spondylosis without myelopathy or radiculopathy, cervical region: Secondary | ICD-10-CM | POA: Insufficient documentation

## 2022-11-12 DIAGNOSIS — N179 Acute kidney failure, unspecified: Secondary | ICD-10-CM | POA: Diagnosis not present

## 2022-11-12 LAB — TYPE AND SCREEN
ABO/RH(D): AB POS
Antibody Screen: NEGATIVE
Unit division: 0
Unit division: 0
Unit division: 0
Unit division: 0
Unit division: 0

## 2022-11-12 LAB — BPAM RBC
Blood Product Expiration Date: 202408202359
Blood Product Expiration Date: 202409012359
Blood Product Expiration Date: 202409022359
Blood Product Expiration Date: 202409022359
Blood Product Expiration Date: 202409032359
ISSUE DATE / TIME: 202408101711
ISSUE DATE / TIME: 202408102001
ISSUE DATE / TIME: 202408102342
ISSUE DATE / TIME: 202408111004
ISSUE DATE / TIME: 202408131311
Unit Type and Rh: 6200
Unit Type and Rh: 6200
Unit Type and Rh: 6200
Unit Type and Rh: 6200
Unit Type and Rh: 6200

## 2022-11-12 LAB — PROTEIN ELECTROPHORESIS, SERUM
A/G Ratio: 1 (ref 0.7–1.7)
Albumin ELP: 3.2 g/dL (ref 2.9–4.4)
Alpha-1-Globulin: 0.3 g/dL (ref 0.0–0.4)
Alpha-2-Globulin: 0.8 g/dL (ref 0.4–1.0)
Beta Globulin: 1.9 g/dL — ABNORMAL HIGH (ref 0.7–1.3)
Gamma Globulin: 0.2 g/dL — ABNORMAL LOW (ref 0.4–1.8)
Globulin, Total: 3.2 g/dL (ref 2.2–3.9)
M-Spike, %: 1.1 g/dL — ABNORMAL HIGH
Total Protein ELP: 6.4 g/dL (ref 6.0–8.5)

## 2022-11-12 MED ORDER — ENOXAPARIN SODIUM 30 MG/0.3ML IJ SOSY: 30.0000 mg | PREFILLED_SYRINGE | INTRAMUSCULAR | Status: DC

## 2022-11-12 MED ORDER — ENOXAPARIN SODIUM 30 MG/0.3ML IJ SOSY
30.0000 mg | PREFILLED_SYRINGE | INTRAMUSCULAR | Status: DC
  Administered 2022-11-13 – 2022-11-14 (×2): 30 mg via SUBCUTANEOUS
  Filled 2022-11-12 (×2): qty 0.3

## 2022-11-12 MED ORDER — ACETAMINOPHEN 325 MG PO TABS
650.0000 mg | ORAL_TABLET | Freq: Four times a day (QID) | ORAL | Status: DC | PRN
  Administered 2022-11-12: 650 mg via ORAL
  Filled 2022-11-12 (×3): qty 2

## 2022-11-12 MED ORDER — SODIUM BICARBONATE 650 MG PO TABS
650.0000 mg | ORAL_TABLET | Freq: Two times a day (BID) | ORAL | Status: DC
  Administered 2022-11-12 – 2022-11-15 (×7): 650 mg via ORAL
  Filled 2022-11-12 (×7): qty 1

## 2022-11-12 MED ORDER — CEFAZOLIN SODIUM-DEXTROSE 2-4 GM/100ML-% IV SOLN
2.0000 g | INTRAVENOUS | Status: AC
  Administered 2022-11-13: 2 g via INTRAVENOUS
  Filled 2022-11-12: qty 100

## 2022-11-12 MED ORDER — LIDOCAINE 5 % EX PTCH
1.0000 | MEDICATED_PATCH | CUTANEOUS | Status: DC
  Administered 2022-11-12 – 2022-11-15 (×4): 1 via TRANSDERMAL
  Filled 2022-11-12 (×4): qty 1

## 2022-11-12 MED ORDER — FERRIC CITRATE 1 GM 210 MG(FE) PO TABS
420.0000 mg | ORAL_TABLET | Freq: Three times a day (TID) | ORAL | Status: DC
  Administered 2022-11-12 – 2022-11-15 (×8): 420 mg via ORAL
  Filled 2022-11-12 (×10): qty 2

## 2022-11-12 NOTE — Plan of Care (Signed)
  Problem: Clinical Measurements: Goal: Ability to maintain clinical measurements within normal limits will improve Outcome: Progressing Goal: Will remain free from infection Outcome: Progressing Goal: Diagnostic test results will improve Outcome: Progressing Goal: Respiratory complications will improve Outcome: Progressing   Problem: Activity: Goal: Risk for activity intolerance will decrease Outcome: Progressing   Problem: Coping: Goal: Level of anxiety will decrease Outcome: Progressing   Problem: Pain Managment: Goal: General experience of comfort will improve Outcome: Progressing   Problem: Safety: Goal: Ability to remain free from injury will improve Outcome: Progressing   Problem: Education: Goal: Knowledge of General Education information will improve Description: Including pain rating scale, medication(s)/side effects and non-pharmacologic comfort measures Outcome: Progressing   Problem: Health Behavior/Discharge Planning: Goal: Ability to manage health-related needs will improve Outcome: Progressing   Problem: Clinical Measurements: Goal: Ability to maintain clinical measurements within normal limits will improve Outcome: Progressing Goal: Will remain free from infection Outcome: Progressing Goal: Diagnostic test results will improve Outcome: Progressing Goal: Respiratory complications will improve Outcome: Progressing Goal: Cardiovascular complication will be avoided Outcome: Progressing   Problem: Activity: Goal: Risk for activity intolerance will decrease Outcome: Progressing   Problem: Nutrition: Goal: Adequate nutrition will be maintained Outcome: Progressing   Problem: Coping: Goal: Level of anxiety will decrease Outcome: Progressing   Problem: Elimination: Goal: Will not experience complications related to bowel motility Outcome: Progressing Goal: Will not experience complications related to urinary retention Outcome: Progressing    Problem: Pain Managment: Goal: General experience of comfort will improve Outcome: Progressing   Problem: Safety: Goal: Ability to remain free from injury will improve Outcome: Progressing   Problem: Skin Integrity: Goal: Risk for impaired skin integrity will decrease Outcome: Progressing

## 2022-11-12 NOTE — Assessment & Plan Note (Signed)
Reported by patient, having some mild neck pain -lidocaine patch daily -PRN tylenol 650mg 

## 2022-11-12 NOTE — Plan of Care (Signed)
  Problem: Clinical Measurements: Goal: Ability to maintain clinical measurements within normal limits will improve Outcome: Progressing Goal: Will remain free from infection Outcome: Progressing Goal: Diagnostic test results will improve Outcome: Progressing   Problem: Activity: Goal: Risk for activity intolerance will decrease Outcome: Progressing   Problem: Education: Goal: Knowledge of General Education information will improve Description: Including pain rating scale, medication(s)/side effects and non-pharmacologic comfort measures Outcome: Progressing

## 2022-11-12 NOTE — Assessment & Plan Note (Signed)
Most likely secondary to multiple myeloma versus primary kidney disease.  Edema resolved after Lasix, discontinued on 8/11. Documentation from PCP (in care everywhere) shows creatinine of 5.07, BUN 49, eGFR 12, calcium 11 (corrected) in May 2024. Investigative nephrology labs (IFA/ANA, ANCA, ASO, complement) negative. Kappa and lambda light chains high. -3-5 plasmapheresis treatments per nephrology, starting today -tunneled catheter placement by IR today -phosphate binder (Auryxia) TID with meals  - renal function slowly improving - Follow-up 24-hour urine protein

## 2022-11-12 NOTE — Assessment & Plan Note (Addendum)
Suspect chronic in nature given physical exam.  Most likely etiology points to chronic kidney failure and/or multiple myeloma. Has received a total of 3u of PRBCs so far, most recently 8/13 due to new transfusion threshold of <8 per oncology. Post transfusion Hgb 9.0 >> 8.1 this morning. -AM CBC -continuous telemetry -Transfusion threshold <8 -continue to monitor

## 2022-11-12 NOTE — Assessment & Plan Note (Signed)
Most likely secondary to multiple myeloma. Differential includes glomerulonephritis, membranous nephropathy. Complement C3/C4 WNL, ASO WNL, glomerular basement membrane WNL, ANA/IFA WNL.

## 2022-11-12 NOTE — Progress Notes (Addendum)
[facility] Progress Note   Home meds include - meloxicam, tylenol, lidocaine patch    BP's initial 171/102 --> now 127/ 76   HR 88  RR 20     CXR - IMPRESSION: Mild diffuse prominence of the parahilar interstitial markings, differential includes mild pulmonary edema versus atypical/viral infection.    CT abd noncontrast -->  IMPRESSION: Multiple subtle lucencies within the iliac bones including 12 mm lesion within the LEFT iliac bone on image 60/3. Small lesion on image 58 the same bone. Similar lesions in the RIGHT iliac bone on image 60/3. Mild permeative pattern in the superior border of the iliac wings.  O/w no acute findings in abd / pelvis.      Dec 2023 --> creat 2.21 in epic     Renal US - 10.6/ 11.3 cm, no hydronephrosis     UP/C ratio - 5.6 gm     UA 8/10 - mod Hb, prot 30, rare bact, 0-5 rbc/wbc/epi     UNa 44,  UCr 35   Assessment/ Plan: Renal failure - in patient w/o much in the way of chronic medical conditions, not taking any medications. Not sure how acute this is since labs from dec 2023 showed elevation at 2.2.  Creat here was 9.0 on admission, w/ associate vol overload, and mild uremic symptoms (nausea). CT showed no obstruction. UA is unremarkable. UP/C ratio is high at 5.6 which doesn't correspond with the 30 protein in the dipstick UA - will get 24 hrs urine for protein. Urine lytes are c/w renal failure. Anemia here is severe and there are some lucent lesions in the pelvic bones which could be due to multiple myeloma. Total prot is not high, but corrected Ca is a bit high. He has been taking nsaids for 4 wks but unlikely that this would do this much damage in that short of time and not c/w membranous nephropathy. GN less likely with UA results. - ASO, ANCA, C3C4, GBM all neg. Appears to be myeloma and appreciate oncology Dr. Arbutus Ped seeing the pt. S/p BMBx 8/13.   Renal appears to be fairly stable and UOP is good; will start NaHCO3 650 BID. K high and L 28K.  D/w Dr. Arbutus Ped and chemotherapy is the definitive treatment but waiting for final bone marrow bx report. Will plan on TPE for 3-5 treatments to decrease burden of lambda light chains in the renal tubules given the advanced renal failure. My concern is that the Cr was already in the 5's in 08/26/2022 and possibility it may be too late for the kidneys. Fortunately he is not uremic.   Vol overload - early IS edema on CXR and LE edema. Started IV lasix 80 bid, looks euvolemic today on exam.  BP - stable BP's here Anemia, severe - Hb 4.8 on admission. SP 2u prbc's 8/10. Hb up to 7.7 Hyperphosphatemia - will start binders auryxia 2 tabs TIDM  Subjective: Cr 8.8 today and denies f/c/n/v/sob.   Vitals:   11/11/22 1633 11/11/22 1654 11/11/22 1956 11/12/22 0350  BP: 137/83 (!) 146/83 (!) 149/89 130/76  Pulse: 74 70 77 71  Resp: 18 18 18 18   Temp: 98.7 F (37.1 C) 98.2 F (36.8 C) 98.4 F (36.9 C) 98.6 F (37 C)  TempSrc: Oral Oral Oral Oral  SpO2: 99% 99% 100% 98%  Weight:      Height:        Exam: Gen alert, no distress No jvd or bruits Chest clear bilat to  bases RRR no MRG Abd soft ntnd no mass or ascites +bs Ext trace pretib edema Neuro is alert, Ox 3 , nf, no asterixis       Recent Labs  Lab 11/11/22 0056 11/11/22 2158 11/12/22 0538  HGB 7.7* 9.0* 8.1*  ALBUMIN 2.6*  --  2.7*  CALCIUM 9.7  --  10.3  PHOS 8.8*  --  8.7*  CREATININE 8.79*  --  8.09*  K 4.2  --  4.5   No results for input(s): "IRON", "TIBC", "FERRITIN" in the last 168 hours. Inpatient medications:  sodium chloride   Intravenous Once   enoxaparin (LOVENOX) injection  30 mg Subcutaneous Q24H   lidocaine  1 patch Transdermal Q24H    acetaminophen

## 2022-11-12 NOTE — Assessment & Plan Note (Signed)
Patient with pelvic lucency on CT.  Additionally has hypercalcemia, anemia, and new onset renal disease. Significantly elevated lambda free light chain, elevated kappa light chain, elevated beta-2 microglobulin and LDH. -- Pending bone marrow biopsy results - UPEP, SPEP pending - Nephrology to see pt in clinic in 1 week -- Oncology involved, ordered several tests to confirm diagnosis. Plan to treat with chemotherapy after this. May need whole-body PET scan after discharge  - Transfusion threshold <8

## 2022-11-12 NOTE — Consult Note (Signed)
Chief Complaint: Patient was seen in consultation today for tunneled plasm pheresis catheter placement Chief Complaint  Patient presents with   Low Hemoglobin   at the request of Dr Charlestine Massed  Supervising Physician: Pernell Dupre  Patient Status: [facility] - In-pt  History of Present Illness: Drew Cooper is a 57 y.o. male   Full Code status per pt Acute renal failure Severe anemia; fatigue Hyperphosphatemia Presumed Multiple Myel multiple lytic bone lesions Bone marrow bx was performed in IR 8/13--- pending results  Renal note today:  Renal appears to be fairly stable and UOP is good; will start NaHCO3 650 BID. K high and L 28K. D/w Dr. Arbutus Ped and chemotherapy is the definitive treatment but waiting for final bone marrow bx report. Will plan on TPE for 3-5 treatments to decrease burden of lambda light chains in the renal tubules given the advanced renal failure. My concern is that the Cr was already in the 5's in 08/26/2022 and possibility it may be too late for the kidneys. Fortunately he is not uremic.   Scheduled for tunneled plasma pheresis catheter in Ir 8/15    Past Surgical History:  Procedure Laterality Date   KNEE SURGERY      Allergies: Patient has no known allergies.  Medications: Prior to Admission medications   Medication Sig Start Date End Date Taking? Authorizing Provider  acetaminophen (TYLENOL) 500 MG tablet Take 500 mg by mouth every 6 (six) hours as needed for moderate pain.   Yes [provider]  lidocaine (LIDODERM) 5 % Place 1 patch onto the skin daily. 05/24/22  Yes [provider]  meloxicam (MOBIC) 15 MG tablet Take 15 mg by mouth daily.   Yes [provider]     Family History  Problem Relation Age of Onset   Hypertension Mother    Heart failure Mother    Diabetes Mother     Social History   Socioeconomic History   Marital status: Married    Spouse name: Not on file   Number of children: Not on file    Years of education: Not on file   Highest education level: Not on file  Occupational History   Not on file  Tobacco Use   Smoking status: Former    Types: Cigarettes    Start date: 1986   Smokeless tobacco: Never  Vaping Use   Vaping status: Never Used  Substance and Sexual Activity   Alcohol use: Yes    Comment: 2 beers per week   Drug use: No   Sexual activity: Not on file  Other Topics Concern   Not on file  Social History Narrative   Not on file   Social Determinants of Health   Financial Resource Strain: Not on file  Food Insecurity: Not on file  Transportation Needs: Not on file  Physical Activity: Not on file  Stress: Not on file  Social Connections: Not on file    Review of Systems: A 12 point ROS discussed and pertinent positives are indicated in the HPI above.  All other systems are negative.  Vital Signs: BP (!) 153/88 (BP Location: Left Arm)   Pulse 78   Temp 97.8 F (36.6 C) (Oral)   Resp 18   Ht 5\' 10"  (1.778 m)   Wt 175 lb 7.8 oz (79.6 kg)   SpO2 98%   BMI 25.18 kg/m     Physical Exam Vitals reviewed.  HENT:     Mouth/Throat:  Mouth: Mucous membranes are moist.  Cardiovascular:     Rate and Rhythm: Normal rate and regular rhythm.     Heart sounds: Normal heart sounds.  Pulmonary:     Effort: Pulmonary effort is normal.     Breath sounds: Normal breath sounds.  Abdominal:     Palpations: Abdomen is soft.     Tenderness: There is no abdominal tenderness.  Musculoskeletal:        General: Normal range of motion.  Skin:    General: Skin is warm.  Neurological:     Mental Status: He is alert and oriented to person, place, and time.  Psychiatric:        Behavior: Behavior normal.     Imaging: CT BONE MARROW BIOPSY & ASPIRATION  Result Date: 11/11/2022 INDICATION: Multiple myeloma EXAM: CT GUIDED LEFT ILIAC BONE MARROW ASPIRATION AND CORE BIOPSY Date:  11/11/2022 11/11/2022 10:07 am Radiologist:  M. Ruel Favors, MD Guidance:  CT  FLUOROSCOPY: Fluoroscopy Time: NONE. MEDICATIONS: 1% lidocaine local ANESTHESIA/SEDATION: 1.0 mg IV Versed; 50 mcg IV Fentanyl Moderate Sedation Time:  10 minute The patient was continuously monitored during the procedure by the interventional radiology nurse under my direct supervision. CONTRAST:  None COMPLICATIONS: None immediate PROCEDURE: Informed consent was obtained from the patient following explanation of the procedure, risks, benefits and alternatives. The patient understands, agrees and consents for the procedure. All questions were addressed. A time out was performed. The patient was positioned prone and non-contrast localization CT was performed of the pelvis to demonstrate the iliac marrow spaces. Maximal barrier sterile technique utilized including caps, mask, sterile gowns, sterile gloves, large sterile drape, hand hygiene, and Betadine prep. Under sterile conditions and local anesthesia, an 11 gauge coaxial bone biopsy needle was advanced into the left iliac marrow space. Needle position was confirmed with CT imaging. Initially, bone marrow aspiration was performed. Next, the 11 gauge outer cannula was utilized to obtain a left iliac bone marrow core biopsy. Needle was removed. Hemostasis was obtained with compression. The patient tolerated the procedure well. Samples were prepared with the cytotechnologist. No immediate complications. IMPRESSION: CT guided left iliac bone marrow aspiration and core biopsy. Electronically Signed   By: Judie Petit.  Shick M.D.   On: 11/11/2022 10:41   DG Bone Survey Met  Result Date: 11/09/2022 CLINICAL DATA:  Renal failure, lytic lesion of hip EXAM: METASTATIC BONE SURVEY COMPARISON:  Partial comparison to CT abdomen/pelvis dated 11/08/2022 FINDINGS: No definite lytic calvarial lesions. Mild degenerative changes of the lower cervical spine, without definite lytic lesions. Mild compression fracture deformities of multiple midthoracic vertebral bodies, without definite  lytic lesions. Degenerative changes. Mild degenerative changes of the lumbar spine, without definite lytic lesions. Small lytic lesions in the bilateral humeri. Small lytic lesion in the left proximal ulna. No lytic lesions in the right forearm. Mild increased interstitial markings in the lungs, without focal consolidation. The heart is normal in size. Thoracic aortic atherosclerosis. Suspected small pleural effusions on the lateral view (excluded on the frontal radiograph). Known lytic lesions in the iliac bones, including the right inferior pubic ramus, although better evaluated on CT. Multiple lytic lesions in the femurs bilaterally. No definite lytic lesions in the bilateral tibia/fibula. IMPRESSION: Multiple lytic lesions in the appendicular skeleton, as above, concerning for multiple myeloma or less likely metastatic disease. Electronically Signed   By: Charline Bills M.D.   On: 11/09/2022 19:03   US RENAL  Result Date: 11/08/2022 CLINICAL DATA:  Acute kidney injury. EXAM: RENAL /  URINARY TRACT ULTRASOUND COMPLETE COMPARISON:  None Available. FINDINGS: Right Kidney: Renal measurements: 10.6 cm x 4.1 cm x 4.5 cm = volume: 103 mL. Diffusely increased echogenicity of the renal parenchyma is noted. No mass or hydronephrosis visualized. Left Kidney: Renal measurements: 11.3 cm x 4.3 cm x 4.2 cm = volume: 105 mL. Diffusely increased echogenicity of the renal parenchyma is noted. No mass or hydronephrosis visualized. Bladder: Appears normal for degree of bladder distention. Other: None. IMPRESSION: Bilateral echogenic kidneys which may be secondary to medical renal disease. Electronically Signed   By: Aram Candela M.D.   On: 11/08/2022 18:44   CT ABDOMEN PELVIS WO CONTRAST  Result Date: 11/08/2022 CLINICAL DATA:  Epigastric pain EXAM: CT ABDOMEN AND PELVIS WITHOUT CONTRAST TECHNIQUE: Multidetector CT imaging of the abdomen and pelvis was performed following the standard protocol without IV contrast.  RADIATION DOSE REDUCTION: This exam was performed according to the departmental dose-optimization program which includes automated exposure control, adjustment of the mA and/or kV according to patient size and/or use of iterative reconstruction technique. COMPARISON:  None Available. FINDINGS: Lower chest: Bilateral small pleural effusions. Hepatobiliary: No focal hepatic lesion. Normal gallbladder. No biliary duct dilatation. Common bile duct is normal. Pancreas: Pancreas is normal. No ductal dilatation. No pancreatic inflammation. Spleen: Normal spleen Adrenals/urinary tract: Adrenal glands normal. Nonobstructing calculi in the RIGHT kidney. Ureters and bladder normal. Stomach/Bowel: Stomach, small bowel, appendix, and cecum are normal. The colon and rectosigmoid colon are normal. Vascular/Lymphatic: Abdominal aorta is normal caliber with atherosclerotic calcification. There is no retroperitoneal or periportal lymphadenopathy. No pelvic lymphadenopathy. Reproductive: Prostate unremarkable Other: No free fluid. Musculoskeletal: Multiple subtle lucencies within the iliac bones including 12 mm lesion within the LEFT iliac bone on image 60/3. Small lesion on image 58 the same bone. Similar lesions in the RIGHT iliac bone on image 60/3. Mild permeative pattern in the superior border of the iliac wings. IMPRESSION: Subtle lucent lesions in the pelvic bones. Recommend correlation with myeloma serology. No acute findings abdomen pelvis. Bilateral pleural effusions. Electronically Signed   By: Genevive Bi M.D.   On: 11/08/2022 13:42   DG Chest Port 1 View  Result Date: 11/08/2022 CLINICAL DATA:  Dyspnea, anemia EXAM: PORTABLE CHEST 1 VIEW COMPARISON:  11/16/2007 chest radiograph. FINDINGS: Stable cardiomediastinal silhouette with normal heart size. No pneumothorax. No pleural effusion. Mild diffuse prominence of the parahilar interstitial markings. IMPRESSION: Mild diffuse prominence of the parahilar interstitial  markings, differential includes mild pulmonary edema versus atypical/viral infection. Electronically Signed   By: Delbert Phenix M.D.   On: 11/08/2022 13:28    Labs:  CBC: Recent Labs    11/09/22 0734 11/09/22 1603 11/10/22 0155 11/11/22 0056 11/11/22 2158 11/12/22 0538  WBC 8.7  --  10.0 9.4  --  9.4  HGB 6.3*   < > 7.7* 7.7* 9.0* 8.1*  HCT 19.3*   < > 22.8* 23.2* 27.7* 25.1*  PLT 217  --  213 225  --  232   < > = values in this interval not displayed.    COAGS: No results for input(s): "INR", "APTT" in the last 8760 hours.  BMP: Recent Labs    11/09/22 0734 11/10/22 0155 11/11/22 0056 11/12/22 0538  NA 136 136 137 138  K 4.3 4.3 4.2 4.5  CL 98 98 100 103  CO2 21* 19* 22 20*  GLUCOSE 90 87 89 94  BUN 70* 71* 66* 63*  CALCIUM 9.6 9.6 9.7 10.3  CREATININE 8.98* 9.26* 8.79* 8.09*  GFRNONAA 6* 6* 6* 7*    LIVER FUNCTION TESTS: Recent Labs    03/08/22 1330 11/08/22 1045 11/09/22 0734 11/10/22 0155 11/11/22 0056 11/12/22 0538  BILITOT 0.4 0.7  --   --   --   --   AST 33 45*  --   --   --   --   ALT 21 56*  --   --   --   --   ALKPHOS 78 65  --   --   --   --   PROT 6.9 7.0  --   --   --   --   ALBUMIN 3.6 2.9* 2.7* 2.7* 2.6* 2.7*    TUMOR MARKERS: No results for input(s): "AFPTM", "CEA", "CA199", "CHROMGRNA" in the last 8760 hours.  Assessment and Plan:  Scheduled for tunneled plasma pheresis catheter placement in IR tomorrow Risks and benefits discussed with the patient including, but not limited to bleeding, infection, vascular injury, pneumothorax which may require chest tube placement, air embolism or even death  All of the patient's questions were answered, patient is agreeable to proceed. Consent signed and in chart.  Thank you for this interesting consult.  I greatly enjoyed meeting Drew Cooper and look forward to participating in their care.  A copy of this report was sent to the requesting provider on this date.  Electronically  Signed: Robet Leu, PA-C 11/12/2022, 1:03 PM   I spent a total of 20 Minutes    in face to face in clinical consultation, greater than 50% of which was counseling/coordinating care for tunneled plasma pheresis catheter placement

## 2022-11-12 NOTE — Progress Notes (Addendum)
Daily Progress Note Intern Pager: [phone]  Patient name: Drew Cooper Medical record number: [id number] Date of birth: 05-Sep-1965 Age: 57 y.o. Gender: male  Primary Care Provider: Patient, No Pcp Per Consultants: nephrology (s/o), oncology Code Status: full code  Pt Overview and Major Events to Date:  32/10 - Admitted with creatinine of 9.15 and Hgb 4.8, transfusion 2 uPRBC. Nephro ordered multiple myeloma labs 8/11 - 1 uPRBC transfused- post tx H&H 7.3 8/12 - oncology consulted, ordered labs and bone marrow bx 8/13 - bone marrow biopsy completed, 1 uPRBC transfused for Hgb threshold <8   Assessment and Plan: Mr Leese is a 57 year-old male with no significant medical history and limited interaction with the healthcare system who presented to the hospital with fatigue and dyspnea. He was found to have significant anemia and markedly elevated creatinine on admission. He is currently stable after multiple blood transfusions and is undergoing workup for possible multiple myeloma.   [facility]     * (Principal) CKD (chronic kidney disease)     Could be secondary to multiple myeloma versus primary kidney disease.   Edema resolved after Lasix, discontinued on 8/11. Documentation from PCP  (in care everywhere) shows creatinine of 5.07, BUN 49, eGFR 12, calcium 11  (corrected) in May 2024. Investigative nephrology labs (IFA/ANA, ANCA,  ASO, complement) negative.  - renal function slowly improving - Nephrology to see outpatient in 1 week - Follow-up 24-hour urine protein        Anemia     Suspect chronic in nature given physical exam.  Most likely etiology  points to chronic kidney failure and/or multiple myeloma. Has received a  total of 3u of PRBCs so far, most recently 8/13 due to new transfusion  threshold of <8 per oncology. Post transfusion Hgb 9.0 >> 8.1 this  morning. -AM CBC -continuous telemetry -Transfusion threshold <8 -continue to monitor         Concern for Multiple myeloma [facility])     Patient with pelvic lucency on CT.  Additionally has hypercalcemia,  anemia, and new onset renal disease. Significantly elevated lambda free  light chain, elevated kappa light chain, elevated beta-2 microglobulin and  LDH. -- Pending bone marrow biopsy results - UPEP, SPEP pending - Nephrology to see pt in clinic in 1 week -- Oncology involved, ordered several tests to confirm diagnosis. Plan to  treat with chemotherapy after this. May need whole-body PET scan after  discharge  - Transfusion threshold <8         Acute renal failure (HCC)     Most likely secondary to multiple myeloma. Differential includes  glomerulonephritis, membranous nephropathy. Complement C3/C4 WNL, ASO WNL,  glomerular basement membrane WNL, ANA/IFA WNL.        Lytic bone lesions on xray     Osteoarthritis of neck     Reported by patient, having some mild neck pain -lidocaine patch daily -PRN tylenol 650mg        FEN/GI: regular PPx: lovenox Dispo: pending clinical improvement   Subjective:  Patient sitting comfortably on his bed this morning, no acute complaints.  States he slept well and just ordered breakfast and is about to walk the hallways.  Says he has osteoarthritis of the neck and is having some pain from this.  States he has an appointment with nephrology for next week. Otherwise, he has no concerns or questions at this time.  Objective: Temp:  [97.5 F (36.4 C)-98.7 F (  37.1 C)] 98.6 F (37 C) (08/14 0350) Pulse Rate:  [67-77] 71 (08/14 0350) Resp:  [18-20] 18 (08/14 0350) BP: (130-149)/(76-89) 130/76 (08/14 0350) SpO2:  [98 %-100 %] 98 % (08/14 0350) Physical Exam: General: Sitting on hospital bed in no acute distress Cardiovascular: Regular rate and rhythm.  Normal S1-S2.  No murmurs rubs or gallops Respiratory: Clear to auscultation bilaterally.  Normal work of breathing. Abdomen: Bowel sounds present.  Soft, nontender,  nondistended. Extremities: No swelling to bilateral lower extremities  Laboratory: Most recent CBC Lab Results  Component Value Date   WBC 9.4 11/12/2022   HGB 8.1 (L) 11/12/2022   HCT 25.1 (L) 11/12/2022   MCV 93.7 11/12/2022   PLT 232 11/12/2022   Most recent BMP    Latest Ref Rng & Units 11/12/2022    5:38 AM  BMP  Glucose 70 - 99 mg/dL 94   BUN 6 - 20 mg/dL 63   Creatinine 1.61 - 1.24 mg/dL 0.96   Sodium 045 - 409 mmol/L 138   Potassium 3.5 - 5.1 mmol/L 4.5   Chloride 98 - 111 mmol/L 103   CO2 22 - 32 mmol/L 20   Calcium 8.9 - 10.3 mg/dL 81.1     Imaging/Diagnostic Tests: None  [name], MD 11/12/2022, 12:01 PM  PGY-1, [facility] FPTS Intern pager: [phone], text pages welcome Secure chat group [facility] Teaching Service

## 2022-11-13 ENCOUNTER — Encounter: Payer: Self-pay | Admitting: Internal Medicine

## 2022-11-13 ENCOUNTER — Inpatient Hospital Stay (HOSPITAL_COMMUNITY): Payer: BC Managed Care – PPO

## 2022-11-13 DIAGNOSIS — N189 Chronic kidney disease, unspecified: Secondary | ICD-10-CM | POA: Diagnosis not present

## 2022-11-13 DIAGNOSIS — C9000 Multiple myeloma not having achieved remission: Secondary | ICD-10-CM | POA: Diagnosis not present

## 2022-11-13 HISTORY — PX: IR US GUIDE VASC ACCESS RIGHT: IMG2390

## 2022-11-13 HISTORY — PX: IR FLUORO GUIDE CV LINE RIGHT: IMG2283

## 2022-11-13 LAB — CBC
HCT: 24.6 % — ABNORMAL LOW (ref 39.0–52.0)
Hemoglobin: 7.9 g/dL — ABNORMAL LOW (ref 13.0–17.0)
MCH: 30.3 pg (ref 26.0–34.0)
MCHC: 32.1 g/dL (ref 30.0–36.0)
MCV: 94.3 fL (ref 80.0–100.0)
Platelets: 224 10*3/uL (ref 150–400)
RBC: 2.61 MIL/uL — ABNORMAL LOW (ref 4.22–5.81)
RDW: 20 % — ABNORMAL HIGH (ref 11.5–15.5)
WBC: 9.6 10*3/uL (ref 4.0–10.5)
nRBC: 0 % (ref 0.0–0.2)

## 2022-11-13 LAB — RENAL FUNCTION PANEL
Albumin: 2.7 g/dL — ABNORMAL LOW (ref 3.5–5.0)
Anion gap: 15 (ref 5–15)
BUN: 60 mg/dL — ABNORMAL HIGH (ref 6–20)
CO2: 22 mmol/L (ref 22–32)
Calcium: 10.2 mg/dL (ref 8.9–10.3)
Chloride: 101 mmol/L (ref 98–111)
Creatinine, Ser: 7.63 mg/dL — ABNORMAL HIGH (ref 0.61–1.24)
GFR, Estimated: 8 mL/min — ABNORMAL LOW (ref >60)
Glucose, Bld: 105 mg/dL — ABNORMAL HIGH (ref 70–99)
Phosphorus: 9.1 mg/dL — ABNORMAL HIGH (ref 2.5–4.6)
Potassium: 4.1 mmol/L (ref 3.5–5.1)
Sodium: 138 mmol/L (ref 135–145)

## 2022-11-13 LAB — BASIC METABOLIC PANEL
Anion gap: 14 (ref 5–15)
BUN: 59 mg/dL — ABNORMAL HIGH (ref 6–20)
CO2: 20 mmol/L — ABNORMAL LOW (ref 22–32)
Calcium: 10.6 mg/dL — ABNORMAL HIGH (ref 8.9–10.3)
Chloride: 102 mmol/L (ref 98–111)
Creatinine, Ser: 7.63 mg/dL — ABNORMAL HIGH (ref 0.61–1.24)
GFR, Estimated: 8 mL/min — ABNORMAL LOW (ref >60)
Glucose, Bld: 130 mg/dL — ABNORMAL HIGH (ref 70–99)
Potassium: 4.4 mmol/L (ref 3.5–5.1)
Sodium: 136 mmol/L (ref 135–145)

## 2022-11-13 MED ORDER — HEPARIN SODIUM (PORCINE) 1000 UNIT/ML IJ SOLN
1000.0000 [IU] | Freq: Once | INTRAMUSCULAR | Status: DC
  Filled 2022-11-13 (×2): qty 1

## 2022-11-13 MED ORDER — DIPHENHYDRAMINE HCL 25 MG PO CAPS
25.0000 mg | ORAL_CAPSULE | Freq: Four times a day (QID) | ORAL | Status: DC | PRN
  Administered 2022-11-13: 25 mg via ORAL
  Filled 2022-11-13: qty 1

## 2022-11-13 MED ORDER — MIDAZOLAM HCL 2 MG/2ML IJ SOLN
INTRAMUSCULAR | Status: AC
  Filled 2022-11-13: qty 2

## 2022-11-13 MED ORDER — CALCIUM GLUCONATE-NACL 2-0.675 GM/100ML-% IV SOLN: 2.0000 g | Freq: Once | INTRAVENOUS | Status: DC

## 2022-11-13 MED ORDER — LIDOCAINE-EPINEPHRINE 1 %-1:100000 IJ SOLN
INTRAMUSCULAR | Status: AC
  Filled 2022-11-13: qty 1

## 2022-11-13 MED ORDER — HEPARIN SODIUM (PORCINE) 1000 UNIT/ML IJ SOLN: 1000.0000 [IU] | Freq: Once | INTRAMUSCULAR | Status: DC

## 2022-11-13 MED ORDER — CALCIUM CARBONATE ANTACID 500 MG PO CHEW: 2.0000 | CHEWABLE_TABLET | ORAL | Status: DC

## 2022-11-13 MED ORDER — MIDAZOLAM HCL 2 MG/2ML IJ SOLN
INTRAMUSCULAR | Status: AC | PRN
  Administered 2022-11-13 (×2): 1 mg via INTRAVENOUS

## 2022-11-13 MED ORDER — CALCIUM CARBONATE ANTACID 500 MG PO CHEW
2.0000 | CHEWABLE_TABLET | ORAL | Status: AC
  Administered 2022-11-13: 400 mg via ORAL
  Filled 2022-11-13: qty 2

## 2022-11-13 MED ORDER — ACD FORMULA A 0.73-2.45-2.2 GM/100ML VI SOLN: 1000.0000 mL | Status: DC

## 2022-11-13 MED ORDER — CEFAZOLIN SODIUM-DEXTROSE 2-4 GM/100ML-% IV SOLN
INTRAVENOUS | Status: AC
  Filled 2022-11-13: qty 100

## 2022-11-13 MED ORDER — SODIUM CHLORIDE 0.9 % IV SOLN: Freq: Once | INTRAVENOUS | Status: DC

## 2022-11-13 MED ORDER — FENTANYL CITRATE (PF) 100 MCG/2ML IJ SOLN
INTRAMUSCULAR | Status: AC
  Filled 2022-11-13: qty 2

## 2022-11-13 MED ORDER — HEPARIN SODIUM (PORCINE) 1000 UNIT/ML IJ SOLN
INTRAMUSCULAR | Status: AC
  Filled 2022-11-13: qty 10

## 2022-11-13 MED ORDER — CEFAZOLIN SODIUM-DEXTROSE 2-4 GM/100ML-% IV SOLN
INTRAVENOUS | Status: AC | PRN
  Administered 2022-11-13: 2 g via INTRAVENOUS

## 2022-11-13 MED ORDER — ACD FORMULA A 0.73-2.45-2.2 GM/100ML VI SOLN
1000.0000 mL | Status: DC
  Filled 2022-11-13 (×3): qty 1000

## 2022-11-13 MED ORDER — DIPHENHYDRAMINE HCL 25 MG PO CAPS: 25.0000 mg | ORAL_CAPSULE | Freq: Four times a day (QID) | ORAL | Status: DC | PRN

## 2022-11-13 MED ORDER — ACETAMINOPHEN 325 MG PO TABS
650.0000 mg | ORAL_TABLET | ORAL | Status: DC | PRN
  Administered 2022-11-13: 650 mg via ORAL

## 2022-11-13 MED ORDER — SODIUM CHLORIDE 0.9 % IV SOLN
INTRAVENOUS | Status: AC | PRN
Start: 2022-11-13 — End: 2022-11-13
  Administered 2022-11-13: 10 mL/h via INTRAVENOUS

## 2022-11-13 MED ORDER — FENTANYL CITRATE (PF) 100 MCG/2ML IJ SOLN
INTRAMUSCULAR | Status: AC | PRN
  Administered 2022-11-13: 50 ug via INTRAVENOUS

## 2022-11-13 MED ORDER — SODIUM CHLORIDE 0.9 % IV SOLN
INTRAVENOUS | Status: AC
  Filled 2022-11-13 (×4): qty 200

## 2022-11-13 MED ORDER — LIDOCAINE-EPINEPHRINE 1 %-1:100000 IJ SOLN
20.0000 mL | Freq: Once | INTRAMUSCULAR | Status: AC
  Administered 2022-11-13: 8 mL via INTRADERMAL

## 2022-11-13 MED ORDER — ACETAMINOPHEN 325 MG PO TABS: 650.0000 mg | ORAL_TABLET | ORAL | Status: DC | PRN

## 2022-11-13 MED ORDER — CALCIUM GLUCONATE-NACL 2-0.675 GM/100ML-% IV SOLN
2.0000 g | Freq: Once | INTRAVENOUS | Status: AC
  Administered 2022-11-13: 2000 mg via INTRAVENOUS
  Filled 2022-11-13 (×2): qty 100

## 2022-11-13 NOTE — TOC Initial Note (Signed)
Transition of Care [facility]) - Initial/Assessment Note    Patient Details  Name: Drew Cooper MRN: [id number] Date of Birth: Jan 17, 1966  [facility] CM/SW Contact:    Harriet Masson, RN Phone Number: 11/13/2022, 2:36 PM  Clinical Narrative:                 Spoke to wife and patient at bedside regarding medicaid application.  Email sent to [facility]. Patient currently has insurance but just lost his job so his insurance won't last much longer. Patient lives with wife. No other TOC needs at this time.   Expected Discharge Plan: Home/Self Care Barriers to Discharge: Continued Medical Work up   Patient Goals and CMS Choice Patient states their goals for this hospitalization and ongoing recovery are:: get better          Expected Discharge Plan and Services In-house Referral: Financial Counselor     Living arrangements for the past 2 months: Single Family Home                                      Prior Living Arrangements/Services Living arrangements for the past 2 months: Single Family Home Lives with:: Spouse Patient language and need for interpreter reviewed:: Yes Do you feel safe going back to the place where you live?: Yes      Need for Family Participation in Patient Care: Yes (Comment) Care giver support system in place?: Yes (comment)   Criminal Activity/Legal Involvement Pertinent to Current Situation/Hospitalization: No - Comment as needed  Activities of Daily Living Home Assistive Devices/Equipment: None ADL Screening (condition at time of admission) Patient's cognitive ability adequate to safely complete daily activities?: Yes Is the patient deaf or have difficulty hearing?: No Does the patient have difficulty seeing, even when wearing glasses/contacts?: No Does the patient have difficulty concentrating, remembering, or making decisions?: No Patient able to express need for assistance with ADLs?: No Does the patient have difficulty  dressing or bathing?: No Independently performs ADLs?: Yes (appropriate for developmental age) Does the patient have difficulty walking or climbing stairs?: No Weakness of Legs: None Weakness of Arms/Hands: None  Permission Sought/Granted                  Emotional Assessment Appearance:: Appears stated age Attitude/Demeanor/Rapport: Gracious Affect (typically observed): Accepting Orientation: : Oriented to Self, Oriented to Place, Oriented to  Time, Oriented to Situation Alcohol / Substance Use: Not Applicable Psych Involvement: No (comment)  Admission diagnosis:  Anemia [D64.9] Acute renal failure, unspecified acute renal failure type (HCC) [N17.9] Anemia, unspecified type [D64.9] Patient Active Problem List   Diagnosis Date Noted   Osteoarthritis of neck 11/12/2022   Concern for Multiple myeloma (HCC) 11/09/2022   Lytic bone lesions on xray 11/09/2022   Anemia 11/08/2022   CKD (chronic kidney disease) 11/08/2022   PCP:  Patient, No Pcp Per Pharmacy:   [id number] - [place], [place] - [address] [facility] [address] [place] [address] Phone: [phone] Fax: [phone]     Social Determinants of Health (SDOH) Social History: SDOH Screenings   Food Insecurity: No Food Insecurity (11/12/2022)  Housing: Low Risk  (11/12/2022)  Transportation Needs: No Transportation Needs (11/12/2022)  Utilities: Not At Risk (11/12/2022)  Tobacco Use: Medium Risk (11/10/2022)   SDOH Interventions:     Readmission Risk Interventions  No data to display

## 2022-11-13 NOTE — Progress Notes (Signed)
   11/13/22 1535  Vitals  Temp 98 F (36.7 C)  Temp Source Oral  Pulse Rate 67  Resp 18  BP 136/86  Oxygen Therapy  SpO2 100 %  O2 Device Room Air  Pain Assessment  Pain Scale 0-10  Pain Score 0  Height and Weight  Weight 80 kg  Apheresis   Procedure Comments Tx done.  Post-apheresis  Net Removed (mL) 4456 mL  Replacement (mL) 3775 mL  ACDA infused (mL) 634 mL  Total Normal Saline Administered 0 mL  Total Calcium Administered 100 mL (2g)  Tolerated Procedure Yes  Post-Procedure Comments Pt completed TPE tx without issue. Pt tolerated well.  Hemodialysis Catheter Right Internal jugular Double lumen Permanent (Tunneled)  Placement Date/Time: 11/13/22 0859   Placed prior to admission: No  Serial / Lot #: [id number]  Expiration Date: 03/31/27  Time Out: Correct patient;Correct site;Correct procedure  Maximum sterile barrier precautions: Hand hygiene;Cap;Mask;Sterile gown...  Site Condition No complications  Blue Lumen Status Heparin locked  Red Lumen Status Heparin locked  Catheter fill solution Heparin 1000 units/ml  Catheter fill volume (Arterial) 1.6 cc  Catheter fill volume (Venous) 1.6  Dressing Type Transparent  Dressing Status Antimicrobial disc in place;Clean, Dry, Intact  Interventions New dressing  Drainage Description None  Dressing Change Due 11/20/22  Post treatment catheter status Capped and Clamped

## 2022-11-13 NOTE — Assessment & Plan Note (Addendum)
Patient with pelvic lucency on CT.  Additionally has hypercalcemia, anemia, and new onset renal disease. Significantly elevated lambda free light chain, elevated kappa light chain. Elevated beta-2 microglobulin and LDH indicate stage 3 disease. SPEP with M-spike in Beta globulin. -- Pending bone marrow biopsy results -- UPEP pending - starting plasmapheresis per nephro -- Oncology involved, ordered several tests to confirm diagnosis. Plan to treat with chemotherapy after this. May need whole-body PET scan after discharge  - Transfusion threshold <8

## 2022-11-13 NOTE — Procedures (Signed)
Interventional Radiology Procedure Note  Procedure: Tunneled HD Catheter  Indication: Renal Failure  Findings:  Catheter tip is at the cavo-atrial junction. Catheter is ready for sure. Please refer to procedural dictation for full description.  Complications: None  EBL: < 10 mL  Farhaan Mir, MD [phone]   

## 2022-11-13 NOTE — Assessment & Plan Note (Addendum)
Suspect chronic in nature given physical exam.  Most likely etiology points to chronic kidney failure and/or multiple myeloma. Has received a total of 3u of PRBCs so far. AM Hgb 7.9 -no transfusion today per Dr Arbutus Ped -AM CBC -continuous telemetry -Transfusion threshold <8 -continue to monitor

## 2022-11-13 NOTE — Plan of Care (Signed)
  Problem: Clinical Measurements: Goal: Ability to maintain clinical measurements within normal limits will improve Outcome: Progressing Goal: Will remain free from infection Outcome: Progressing Goal: Diagnostic test results will improve Outcome: Progressing Goal: Respiratory complications will improve Outcome: Progressing   Problem: Activity: Goal: Risk for activity intolerance will decrease Outcome: Progressing   Problem: Coping: Goal: Level of anxiety will decrease Outcome: Progressing   Problem: Pain Managment: Goal: General experience of comfort will improve Outcome: Progressing   Problem: Safety: Goal: Ability to remain free from injury will improve Outcome: Progressing   Problem: Education: Goal: Knowledge of General Education information will improve Description: Including pain rating scale, medication(s)/side effects and non-pharmacologic comfort measures Outcome: Progressing   Problem: Health Behavior/Discharge Planning: Goal: Ability to manage health-related needs will improve Outcome: Progressing   Problem: Clinical Measurements: Goal: Ability to maintain clinical measurements within normal limits will improve Outcome: Progressing Goal: Will remain free from infection Outcome: Progressing Goal: Diagnostic test results will improve Outcome: Progressing Goal: Respiratory complications will improve Outcome: Progressing Goal: Cardiovascular complication will be avoided Outcome: Progressing   Problem: Activity: Goal: Risk for activity intolerance will decrease Outcome: Progressing   Problem: Nutrition: Goal: Adequate nutrition will be maintained Outcome: Progressing   Problem: Coping: Goal: Level of anxiety will decrease Outcome: Progressing   Problem: Elimination: Goal: Will not experience complications related to bowel motility Outcome: Progressing Goal: Will not experience complications related to urinary retention Outcome: Progressing    Problem: Pain Managment: Goal: General experience of comfort will improve Outcome: Progressing   Problem: Safety: Goal: Ability to remain free from injury will improve Outcome: Progressing   Problem: Skin Integrity: Goal: Risk for impaired skin integrity will decrease Outcome: Progressing

## 2022-11-13 NOTE — Progress Notes (Signed)
START ON PATHWAY REGIMEN - Multiple Myeloma and Other Plasma Cell Dyscrasias     A cycle is every 21 days:     Dexamethasone      Bortezomib      Cyclophosphamide   **Always confirm dose/schedule in your pharmacy ordering system**  Patient Characteristics: Multiple Myeloma, Newly Diagnosed, Transplant Eligible, Standard Risk Disease Classification: Multiple Myeloma Therapeutic Status: Newly Diagnosed R2-ISS Staging: III Is Patient Eligible for Transplant<= Transplant Eligible Risk Status: Standard Risk Intent of Therapy: Non-Curative / Palliative Intent, Discussed with Patient

## 2022-11-13 NOTE — Progress Notes (Signed)
[facility] Progress Note   Home meds include - meloxicam, tylenol, lidocaine patch    BP's initial 171/102 --> now 127/ 76   HR 88  RR 20     CXR - IMPRESSION: Mild diffuse prominence of the parahilar interstitial markings, differential includes mild pulmonary edema versus atypical/viral infection.    CT abd noncontrast -->  IMPRESSION: Multiple subtle lucencies within the iliac bones including 12 mm lesion within the LEFT iliac bone on image 60/3. Small lesion on image 58 the same bone. Similar lesions in the RIGHT iliac bone on image 60/3. Mild permeative pattern in the superior border of the iliac wings.  O/w no acute findings in abd / pelvis.      Dec 2023 --> creat 2.21 in epic     Renal US - 10.6/ 11.3 cm, no hydronephrosis     UP/C ratio - 5.6 gm     UA 8/10 - mod Hb, prot 30, rare bact, 0-5 rbc/wbc/epi     UNa 44,  UCr 35   Assessment/ Plan: Renal failure - in patient w/o much in the way of chronic medical conditions, not taking any medications. Not sure how acute this is since labs from dec 2023 showed elevation at 2.2.  Creat here was 9.0 on admission, w/ associate vol overload, and mild uremic symptoms (nausea). CT showed no obstruction. UA is unremarkable. UP/C ratio is high at 5.6 which doesn't correspond with the 30 protein in the dipstick UA - will get 24 hrs urine for protein. Urine lytes are c/w renal failure. Anemia here is severe and there are some lucent lesions in the pelvic bones which could be due to multiple myeloma. Total prot is not high, but corrected Ca is a bit high. He has been taking nsaids for 4 wks but unlikely that this would do this much damage in that short of time and not c/w membranous nephropathy. GN less likely with UA results. - ASO, ANCA, C3C4, GBM all neg. Appears to be myeloma and appreciate oncology Dr. Arbutus Ped seeing the pt. S/p BMBx 8/13.   Renal appears to be fairly stable and UOP is good; started NaHCO3 650 BID. K minimally elevated  and L 28K. D/w Dr. Arbutus Ped and chemotherapy is the definitive treatment but waiting for final bone marrow bx report. Will plan on TPE for 3 treatments to decrease burden of lambda light chains in the renal tubules given the advanced renal failure. My concern is that the Cr was already in the 5's in 08/26/2022 and possibility it may be too late for the kidneys. Fortunately he is not uremic. Certainly do not want TPE to get in the way of chemotherapy.  Appreciate VIR agreeing to place TC this AM and will PLEX afterwards.   Vol overload - early IS edema on CXR and LE edema. Off IV lasix, looks euvolemic today on exam.  BP - stable BP's here Anemia, severe - Hb 4.8 on admission. SP 2u prbc's 8/10. Hb up to 7.7 Hyperphosphatemia - started binders auryxia 2 tabs TIDM and will need to recheck phos on Sat.  Subjective: Cr 8.8 today and denies f/c/n/v/sob.   Vitals:   11/12/22 0800 11/12/22 1956 11/13/22 0428 11/13/22 0500  BP: (!) 153/88 (!) 162/93 128/81   Pulse: 78 87 84   Resp: 18 18 18    Temp: 97.8 F (36.6 C) 98.8 F (37.1 C) 98 F (36.7 C)   TempSrc: Oral Oral Oral   SpO2:  100% 98%  Weight:    79.9 kg  Height:        Exam: Gen alert, no distress No jvd or bruits Chest clear bilat to bases RRR no MRG Abd soft ntnd no mass or ascites +bs Ext trace pretib edema Neuro is alert, Ox 3 , nf, no asterixis       Recent Labs  Lab 11/12/22 0538 11/13/22 0113  HGB 8.1* 7.9*  ALBUMIN 2.7* 2.7*  CALCIUM 10.3 10.2  PHOS 8.7* 9.1*  CREATININE 8.09* 7.63*  K 4.5 4.1   No results for input(s): "IRON", "TIBC", "FERRITIN" in the last 168 hours. Inpatient medications:  sodium chloride   Intravenous Once   enoxaparin (LOVENOX) injection  30 mg Subcutaneous Q24H   ferric citrate  420 mg Oral TID WC   lidocaine  1 patch Transdermal Q24H   sodium bicarbonate  650 mg Oral BID    acetaminophen

## 2022-11-13 NOTE — Progress Notes (Signed)
Daily Progress Note Intern Pager: [phone]  Patient name: Drew Cooper Medical record number: [id number] Date of birth: February 17, 1966 Age: 57 y.o. Gender: male  Primary Care Provider: Patient, No Pcp Per Consultants: nephrology, oncology Code Status: Full code  Pt Overview and Major Events to Date:  8/10 - Admitted with creatinine of 9.15 and Hgb 4.8, transfusion 2 uPRBC 8/11 - 1 uPRBC transfused- post tx H&H 7.3 8/13 - BM biopsy. 1 uPRBC transfused for Hgb threshold <8   Assessment and Plan: Drew Cooper is a 57 year-old male with no significant medical history and limited interaction with the healthcare system who presented to the hospital with fatigue and dyspnea. He was found to have significant anemia and markedly elevated creatinine on admission. He is currently stable after multiple blood transfusions and is undergoing workup for possible multiple myeloma. Pending bone marrow biopsy results. Starting plasmapheresis due to significant lambda light chain level.  [facility]     * (Principal) CKD (chronic kidney disease)     Most likely secondary to multiple myeloma versus primary kidney  disease.  Edema resolved after Lasix, discontinued on 8/11. Documentation  from PCP (in care everywhere) shows creatinine of 5.07, BUN 49, eGFR 12,  calcium 11 (corrected) in May 2024. Investigative nephrology labs  (IFA/ANA, ANCA, ASO, complement) negative. Kappa and lambda light chains  high. -3-5 plasmapheresis treatments per nephrology, starting today -tunneled catheter placement by IR today -phosphate binder (Auryxia) TID with meals  - renal function slowly improving - Follow-up 24-hour urine protein        Anemia     Suspect chronic in nature given physical exam.  Most likely etiology  points to chronic kidney failure and/or multiple myeloma. Has received a  total of 3u of PRBCs so far. AM Hgb 7.9 -no transfusion today per Dr Arbutus Ped -AM CBC -continuous  telemetry -Transfusion threshold <8 -continue to monitor        Concern for Multiple myeloma [facility])     Patient with pelvic lucency on CT.  Additionally has hypercalcemia,  anemia, and new onset renal disease. Significantly elevated lambda free  light chain, elevated kappa light chain. Elevated beta-2 microglobulin and  LDH indicate stage 3 disease. SPEP with M-spike in Beta globulin. -- Pending bone marrow biopsy results -- UPEP pending - starting plasmapheresis per nephro -- Oncology involved, ordered several tests to confirm diagnosis. Plan to  treat with chemotherapy after this. May need whole-body PET scan after  discharge  - Transfusion threshold <8         Lytic bone lesions on xray     Osteoarthritis of neck     Reported by patient, having some mild neck pain -lidocaine patch daily -PRN tylenol 650mg       FEN/GI: NPO for catheter placement PPx: lovenox  Dispo: pending clinical improvement  Subjective:  Patient returned from getting catheter placed earlier this morning.  Reports some pain and soreness in his neck, using lidocaine patch for this.  Aware of plan to go to for plasmapheresis today.  Asking about insurance options, will notify social worker.  Objective: Temp:  [98 F (36.7 C)-98.8 F (37.1 C)] 98 F (36.7 C) (08/15 0428) Pulse Rate:  [71-87] 86 (08/15 0905) Resp:  [12-18] 17 (08/15 0905) BP: (128-162)/(81-93) 133/81 (08/15 0905) SpO2:  [97 %-100 %] 97 % (08/15 0905) Weight:  [79.9 kg] 79.9 kg (08/15 0500) Physical Exam: General: Resting comfortably on bed, in no acute distress  Cardiovascular: Regular rate and rhythm, normal S1/S2 no murmurs rubs or gallops Respiratory: Clear to auscultation bilaterally.  No wheezes rales or rhonchi Abdomen: Soft nontender nondistended positive bowel sounds Extremities: No swelling to bilateral lower extremities  Laboratory: Most recent CBC Lab Results  Component Value Date   WBC 9.6 11/13/2022   HGB 7.9 (L)  11/13/2022   HCT 24.6 (L) 11/13/2022   MCV 94.3 11/13/2022   PLT 224 11/13/2022   Most recent BMP    Latest Ref Rng & Units 11/13/2022   10:52 AM  BMP  Glucose 70 - 99 mg/dL 846   BUN 6 - 20 mg/dL 59   Creatinine 9.62 - 1.24 mg/dL 9.52   Sodium 841 - 324 mmol/L 136   Potassium 3.5 - 5.1 mmol/L 4.4   Chloride 98 - 111 mmol/L 102   CO2 22 - 32 mmol/L 20   Calcium 8.9 - 10.3 mg/dL 40.1     Other pertinent labs  24 hour urine protein pending Bone marrow biopsy results pending   Imaging/Diagnostic Tests: none [name], MD 11/13/2022, 1:06 PM  PGY-1, [facility] FPTS Intern pager: [phone], text pages welcome Secure chat group [facility] Teaching [facility]

## 2022-11-13 NOTE — Progress Notes (Signed)
Subjective: The patient is seen and examined today.  He just came back from plasmapheresis and feeling fine with no complaints.  He has no nausea, vomiting, diarrhea or constipation.  He has no headache or visual changes.  He has no fever or chills.  The result from the bone marrow biopsy and aspirate showed hypercellular bone marrow with plasma cell neoplasm and plasma cells representing 53% of all cells in the aspirate.  Objective: Vital signs in last 24 hours: Temp:  [97.8 F (36.6 C)-98.8 F (37.1 C)] 97.8 F (36.6 C) (08/15 1636) Pulse Rate:  [64-87] 74 (08/15 1636) Resp:  [12-18] 18 (08/15 1636) BP: (128-162)/(69-93) 151/86 (08/15 1636) SpO2:  [97 %-100 %] 99 % (08/15 1636) Weight:  [176 lb 2.4 oz (79.9 kg)-176 lb 5.9 oz (80 kg)] 176 lb 5.9 oz (80 kg) (08/15 1535)  Intake/Output from previous day: 08/14 0701 - 08/15 0700 In: -  Out: 2300 [Urine:2300] Intake/Output this shift: No intake/output data recorded.  General appearance: alert, cooperative, fatigued, and no distress Resp: clear to auscultation bilaterally Cardio: regular rate and rhythm, S1, S2 normal, no murmur, click, rub or gallop GI: soft, non-tender; bowel sounds normal; no masses,  no organomegaly Extremities: extremities normal, atraumatic, no cyanosis or edema  Lab Results:  Recent Labs    11/12/22 0538 11/13/22 0113  WBC 9.4 9.6  HGB 8.1* 7.9*  HCT 25.1* 24.6*  PLT 232 224   BMET Recent Labs    11/13/22 0113 11/13/22 1052  NA 138 136  K 4.1 4.4  CL 101 102  CO2 22 20*  GLUCOSE 105* 130*  BUN 60* 59*  CREATININE 7.63* 7.63*  CALCIUM 10.2 10.6*    Studies/Results: IR Fluoro Guide CV Line Right  Result Date: 11/13/2022 INDICATION: Acute kidney injury EXAM: Tunneled hemodialysis catheter MEDICATIONS: Ancef 2 g IV; The antibiotic was administered within an appropriate time interval prior to skin puncture. ANESTHESIA/SEDATION: Moderate (conscious) sedation was employed during this procedure. A  total of Versed 2 mg and Fentanyl 50 mcg was administered intravenously by the radiology nurse. Total intra-service moderate Sedation Time: 7 minutes. The patient's level of consciousness and vital signs were monitored continuously by radiology nursing throughout the procedure under my direct supervision. FLUOROSCOPY: Radiation Exposure Index (as provided by the fluoroscopic device): 6 mGy Kerma COMPLICATIONS: None immediate. PROCEDURE: Informed written consent was obtained from the patient after a thorough discussion of the procedural risks, benefits and alternatives. All questions were addressed. Maximal Sterile Barrier Technique was utilized including caps, mask, sterile gowns, sterile gloves, sterile drape, hand hygiene and skin antiseptic. A timeout was performed prior to the initiation of the procedure. The right internal jugular vein was evaluated with ultrasound and shown to be patent. A permanent ultrasound image was obtained and placed in the patient's medical record. Using sterile gel and a sterile probe cover, the right internal jugular vein was entered with a 21 ga needle during real time ultrasound guidance. 0.018 inch guidewire placed and 21 ga needle exchanged for transitional dilator set. Utilizing fluoroscopy, 0.035 inch guidewire advanced through the dilator without difficulty. Seriel dilation was performed and peel-away sheath was placed. Attention then turned to the right anterior upper chest. Following local lidocaine administration, the hemodialysis catheter was tunneled from the chest wall to the venotomy site. The catheter was inserted through the peel-away sheath. The tip of the catheter was positioned within the right atrium using fluoroscopic guidance. All lumens of the catheter aspirated and flushed well. The dialysis lumens were  locked with Heparin. The catheter was secured to the skin with suture. The insertion site was covered with sterile dressing. IMPRESSION: 19 cm tunneled right IJ  hemodialysis catheter is ready for use. Electronically Signed   By: Acquanetta Belling M.D.   On: 11/13/2022 09:51   IR US Guide Vasc Access Right  Result Date: 11/13/2022 INDICATION: Acute kidney injury EXAM: Tunneled hemodialysis catheter MEDICATIONS: Ancef 2 g IV; The antibiotic was administered within an appropriate time interval prior to skin puncture. ANESTHESIA/SEDATION: Moderate (conscious) sedation was employed during this procedure. A total of Versed 2 mg and Fentanyl 50 mcg was administered intravenously by the radiology nurse. Total intra-service moderate Sedation Time: 7 minutes. The patient's level of consciousness and vital signs were monitored continuously by radiology nursing throughout the procedure under my direct supervision. FLUOROSCOPY: Radiation Exposure Index (as provided by the fluoroscopic device): 6 mGy Kerma COMPLICATIONS: None immediate. PROCEDURE: Informed written consent was obtained from the patient after a thorough discussion of the procedural risks, benefits and alternatives. All questions were addressed. Maximal Sterile Barrier Technique was utilized including caps, mask, sterile gowns, sterile gloves, sterile drape, hand hygiene and skin antiseptic. A timeout was performed prior to the initiation of the procedure. The right internal jugular vein was evaluated with ultrasound and shown to be patent. A permanent ultrasound image was obtained and placed in the patient's medical record. Using sterile gel and a sterile probe cover, the right internal jugular vein was entered with a 21 ga needle during real time ultrasound guidance. 0.018 inch guidewire placed and 21 ga needle exchanged for transitional dilator set. Utilizing fluoroscopy, 0.035 inch guidewire advanced through the dilator without difficulty. Seriel dilation was performed and peel-away sheath was placed. Attention then turned to the right anterior upper chest. Following local lidocaine administration, the hemodialysis  catheter was tunneled from the chest wall to the venotomy site. The catheter was inserted through the peel-away sheath. The tip of the catheter was positioned within the right atrium using fluoroscopic guidance. All lumens of the catheter aspirated and flushed well. The dialysis lumens were locked with Heparin. The catheter was secured to the skin with suture. The insertion site was covered with sterile dressing. IMPRESSION: 19 cm tunneled right IJ hemodialysis catheter is ready for use. Electronically Signed   By: Acquanetta Belling M.D.   On: 11/13/2022 09:51    Medications: I have reviewed the patient's current medications.  CODE STATUS: Full code  Assessment/Plan: This is a very pleasant 57 years old white male recently diagnosed with multiple myeloma with lytic bone lesions in addition to renal insufficiency and bone marrow biopsy and aspirate consistent with plasma cell neoplasm with 53% plasma cells. This was diagnosed in August 2024. I had a lengthy discussion with the patient today about his current condition and treatment options. With renal insufficiency, the patient is currently undergoing plasmapheresis status post 1 treatment. I also discussed with the patient started systemic therapy during his hospital stay with subcutaneous Velcade 1.3 Mg/KG on days 1, 4, 8 and 11 every 3 weeks, cyclophosphamide 500 Mg/M2 IV on days 1 and 8 as well as Decadron 40 mg p.o. on weekly basis start with the first dose of Velcade. Once his renal function improves, I will treat him with the standard treatment with daratumumab, Velcade, Revlimid and Decadron for 4 cycles followed by evaluation for autologous stem cell transplant. The patient is in agreement with the treatment plan and he would like to start with the treatment as soon as  possible. I will arrange for the patient a follow-up appointment with me at the [facility] after discharge for more detailed discussion of his treatment options and  management. He will need a whole body PET scan done on outpatient basis after discharge. For the anemia of neoplastic disease, we will try to keep his hemoglobin above 8 before discharge from the hospital. Thank you for taking good care of Mr. Primus.  Please call if you have any questions.  LOS: 5 days    Lajuana Matte 11/13/2022

## 2022-11-13 NOTE — Progress Notes (Signed)
CHART NOTE I just spoke to Dr. Laureen Ochs from pathology and the bone marrow biopsy and aspirate are consistent with multiple myeloma. I will see the patient tomorrow and discussed with him starting treatment with subcutaneous Velcade and Decadron in the hospital at least for 1 or 2 doses before discharge. I will also arrange for him a follow-up appointment with me at the cancer center next week for more detailed discussion of his treatment options initially with Velcade and Decadron and once his kidney function improved, I may consider adding additional treatment like daratumumab and Revlimid.

## 2022-11-14 DIAGNOSIS — C9000 Multiple myeloma not having achieved remission: Secondary | ICD-10-CM | POA: Diagnosis not present

## 2022-11-14 LAB — RENAL FUNCTION PANEL
Albumin: 3.6 g/dL (ref 3.5–5.0)
Anion gap: 12 (ref 5–15)
BUN: 56 mg/dL — ABNORMAL HIGH (ref 6–20)
CO2: 19 mmol/L — ABNORMAL LOW (ref 22–32)
Calcium: 10 mg/dL (ref 8.9–10.3)
Chloride: 106 mmol/L (ref 98–111)
Creatinine, Ser: 7.15 mg/dL — ABNORMAL HIGH (ref 0.61–1.24)
GFR, Estimated: 8 mL/min — ABNORMAL LOW (ref >60)
Glucose, Bld: 88 mg/dL (ref 70–99)
Phosphorus: 6.8 mg/dL — ABNORMAL HIGH (ref 2.5–4.6)
Potassium: 4.5 mmol/L (ref 3.5–5.1)
Sodium: 137 mmol/L (ref 135–145)

## 2022-11-14 LAB — IGG, IGA, IGM
IgA: 84 mg/dL — ABNORMAL LOW (ref 90–386)
IgG (Immunoglobin G), Serum: 484 mg/dL — ABNORMAL LOW (ref 603–1613)
IgM (Immunoglobulin M), Srm: 23 mg/dL (ref 20–172)

## 2022-11-14 LAB — CBC
HCT: 26.7 % — ABNORMAL LOW (ref 39.0–52.0)
Hemoglobin: 8.4 g/dL — ABNORMAL LOW (ref 13.0–17.0)
MCH: 30.1 pg (ref 26.0–34.0)
MCHC: 31.5 g/dL (ref 30.0–36.0)
MCV: 95.7 fL (ref 80.0–100.0)
Platelets: 232 10*3/uL (ref 150–400)
RBC: 2.79 MIL/uL — ABNORMAL LOW (ref 4.22–5.81)
RDW: 19.7 % — ABNORMAL HIGH (ref 11.5–15.5)
WBC: 9.5 10*3/uL (ref 4.0–10.5)
nRBC: 0 % (ref 0.0–0.2)

## 2022-11-14 MED ORDER — PNEUMOCOCCAL 20-VAL CONJ VACC 0.5 ML IM SUSY: 0.5000 mL | PREFILLED_SYRINGE | Freq: Once | INTRAMUSCULAR | Status: DC

## 2022-11-14 MED ORDER — CHLORHEXIDINE GLUCONATE CLOTH 2 % EX PADS
6.0000 | MEDICATED_PAD | Freq: Every day | CUTANEOUS | Status: DC
  Administered 2022-11-14 – 2022-11-15 (×2): 6 via TOPICAL

## 2022-11-14 MED ORDER — PALONOSETRON HCL INJECTION 0.25 MG/5ML
0.2500 mg | Freq: Once | INTRAVENOUS | Status: AC
  Administered 2022-11-14: 0.25 mg via INTRAVENOUS
  Filled 2022-11-14: qty 5

## 2022-11-14 MED ORDER — SODIUM CHLORIDE 0.9 % IV SOLN
500.0000 mg/m2 | Freq: Once | INTRAVENOUS | Status: AC
  Administered 2022-11-14: 1000 mg via INTRAVENOUS
  Filled 2022-11-14: qty 50

## 2022-11-14 MED ORDER — BORTEZOMIB CHEMO SQ INJECTION 3.5 MG (2.5MG/ML)
1.3000 mg/m2 | Freq: Once | INTRAMUSCULAR | Status: AC
  Administered 2022-11-14: 2.5 mg via SUBCUTANEOUS
  Filled 2022-11-14: qty 1

## 2022-11-14 MED ORDER — PNEUMOCOCCAL 20-VAL CONJ VACC 0.5 ML IM SUSY
0.5000 mL | PREFILLED_SYRINGE | INTRAMUSCULAR | Status: AC | PRN
  Administered 2022-11-15: 0.5 mL via INTRAMUSCULAR
  Filled 2022-11-14: qty 0.5

## 2022-11-14 MED ORDER — SODIUM CHLORIDE 0.9 % IV SOLN
40.0000 mg | Freq: Once | INTRAVENOUS | Status: AC
  Administered 2022-11-14: 40 mg via INTRAVENOUS
  Filled 2022-11-14: qty 4

## 2022-11-14 MED ORDER — ACYCLOVIR 200 MG PO CAPS
200.0000 mg | ORAL_CAPSULE | Freq: Every day | ORAL | Status: DC
  Administered 2022-11-14 – 2022-11-15 (×2): 200 mg via ORAL
  Filled 2022-11-14 (×2): qty 1

## 2022-11-14 NOTE — TOC Progression Note (Signed)
Transition of Care [facility]) - Progression Note    Patient Details  Name: Drew Cooper MRN: [id number] Date of Birth: 06/05/1965  Transition of Care [facility]) CM/SW Contact  Epifanio Lesches, RN Phone Number: 11/14/2022, 2:01 PM  Clinical Narrative:    NCM made financial counseling referral, pt concern soon to be without health insurance , recent job loss and recent dx ( multiple myeloma. TOC team will continue following and assist with needs.  Expected Discharge Plan: Home/Self Care Barriers to Discharge: Continued Medical Work up  Expected Discharge Plan and Services In-house Referral: Financial Counselor     Living arrangements for the past 2 months: Single Family Home                                       Social Determinants of Health (SDOH) Interventions SDOH Screenings   Food Insecurity: No Food Insecurity (11/12/2022)  Housing: Low Risk  (11/12/2022)  Transportation Needs: No Transportation Needs (11/12/2022)  Utilities: Not At Risk (11/12/2022)  Tobacco Use: Medium Risk (11/10/2022)    Readmission Risk Interventions     No data to display

## 2022-11-14 NOTE — Assessment & Plan Note (Addendum)
Most likely secondary to multiple myeloma versus primary kidney disease.  Edema resolved after Lasix, discontinued on 8/11. Documentation from PCP (in care everywhere) shows creatinine of 5.07, BUN 49, eGFR 12, calcium 11 (corrected) in May 2024. Investigative nephrology labs (IFA/ANA, ANCA, ASO, complement) negative.  Tunneled cathether placed 8/15 for PLEX treatment- first tx completed 8/15. -nephro plan for 3-4 more PLEX treatments, can be done outpatient -phosphate binder (Auryxia) TID with meals  - renal function slowly improving

## 2022-11-14 NOTE — Progress Notes (Addendum)
Ok to treat with elevated Scr today per MD. Proceed with Cytoxan 500 mg/m2. Scr is improving.   Drew Cooper [place], [place], BCPS, BCOP 11/14/2022 10:14 AM

## 2022-11-14 NOTE — Assessment & Plan Note (Addendum)
Patient with pelvic lucency on CT.  Additionally has hypercalcemia, anemia, and new onset renal disease. Significantly elevated lambda free light chain, elevated kappa light chain. Elevated beta-2 microglobulin and LDH indicate stage 3 disease. SPEP with M-spike in Beta globulin. Urine protein 8k. Bone marrow bx showed 53% plasma cells. -- Per oncology- plan to treat with Velcade, cyclophosphamide, and decadron starting while inpatient. First treatment today. May need whole-body PET scan after discharge  - Transfusion threshold <8

## 2022-11-14 NOTE — Progress Notes (Signed)
[facility] Progress Note   Home meds include - meloxicam, tylenol, lidocaine patch    BP's initial 171/102 --> now 127/ 76   HR 88  RR 20     CXR - IMPRESSION: Mild diffuse prominence of the parahilar interstitial markings, differential includes mild pulmonary edema versus atypical/viral infection.    CT abd noncontrast -->  IMPRESSION: Multiple subtle lucencies within the iliac bones including 12 mm lesion within the LEFT iliac bone on image 60/3. Small lesion on image 58 the same bone. Similar lesions in the RIGHT iliac bone on image 60/3. Mild permeative pattern in the superior border of the iliac wings.  O/w no acute findings in abd / pelvis.      Dec 2023 --> creat 2.21 in epic     Renal US - 10.6/ 11.3 cm, no hydronephrosis     UP/C ratio - 5.6 gm     UA 8/10 - mod Hb, prot 30, rare bact, 0-5 rbc/wbc/epi     UNa 44,  UCr 35   Assessment/ Plan: Renal failure - in patient w/o much in the way of chronic medical conditions, not taking any medications. Not sure how acute this is since labs from dec 2023 showed elevation at 2.2.  Creat here was 9.0 on admission, w/ associate vol overload, and mild uremic symptoms (nausea). CT showed no obstruction. UA is unremarkable. UP/C ratio is high at 5.6 which doesn't correspond with the 30 protein in the dipstick UA - will get 24 hrs urine for protein. Urine lytes are c/w renal failure. Anemia here is severe and there are some lucent lesions in the pelvic bones which could be due to multiple myeloma. Total prot is not high, but corrected Ca is a bit high. He has been taking nsaids for 4 wks but unlikely that this would do this much damage in that short of time and not c/w membranous nephropathy. GN less likely with UA results. - ASO, ANCA, C3C4, GBM all neg. Myeloma (lambda 28K) by BMBx 8/13 (53% plasma cells) and appreciate oncology Dr. Arbutus Ped seeing the pt.   Will plan on TPE for 3-5  treatments to decrease burden of lambda light chains  in the renal tubules given the advanced renal failure. My concern is that the Cr was already in the 5's in 08/26/2022 and possibility it may be too late for the kidneys. Fortunately he is not uremic. Certainly do not want TPE to get in the way of chemotherapy.  TPE #2 Saturday and then can plan around when pt is discharged; we can certainly do outpt TPE.    Vol overload - early IS edema on CXR and LE edema. Off IV lasix, looks euvolemic today on exam.  BP - stable BP's here Anemia, severe - Hb 4.8 on admission. SP 2u prbc's 8/10. Hb up to 7.7 Hyperphosphatemia - started binders auryxia 2 tabs TIDM and will need to recheck phos on Sat.  Subjective: Cr 7/1  today and denies f/c/n/v/sob.   Vitals:   11/14/22 0422 11/14/22 0500 11/14/22 0859 11/14/22 1207  BP: (!) 141/90  (!) 154/91 (!) 146/96  Pulse: 86  76 80  Resp: 18  16 16   Temp:   98.2 F (36.8 C) 98.2 F (36.8 C)  TempSrc:   Oral Oral  SpO2: 97%  100% 100%  Weight:  79.6 kg    Height:        Exam: Gen alert, no distress No jvd or bruits Chest clear bilat to  bases RRR no MRG Abd soft ntnd no mass or ascites +bs Ext trace pretib edema Neuro is alert, Ox 3 , nf, no asterixis Access: RIJ TC       Recent Labs  Lab 11/13/22 0113 11/13/22 1052 11/14/22 0010  HGB 7.9*  --  8.4*  ALBUMIN 2.7*  --  3.6  CALCIUM 10.2 10.6* 10.0  PHOS 9.1*  --  6.8*  CREATININE 7.63* 7.63* 7.15*  K 4.1 4.4 4.5   No results for input(s): "IRON", "TIBC", "FERRITIN" in the last 168 hours. Inpatient medications:  sodium chloride   Intravenous Once   acyclovir  200 mg Oral Daily   bortezomib SQ  1.3 mg/m2 (Treatment Plan Recorded) Subcutaneous Once   Chlorhexidine Gluconate Cloth  6 each Topical Daily   cyclophosphamide  500 mg/m2 (Treatment Plan Recorded) Intravenous Once   enoxaparin (LOVENOX) injection  30 mg Subcutaneous Q24H   ferric citrate  420 mg Oral TID WC   heparin sodium (porcine)  1,000 Units Intracatheter Once   lidocaine   1 patch Transdermal Q24H   sodium bicarbonate  650 mg Oral BID    citrate dextrose     acetaminophen, acetaminophen, diphenhydrAMINE, pneumococcal 20-valent conjugate vaccine

## 2022-11-14 NOTE — Plan of Care (Signed)
  Problem: Clinical Measurements: Goal: Diagnostic test results will improve Outcome: Progressing   

## 2022-11-14 NOTE — Plan of Care (Signed)
Problem: Education: Goal: Knowledge of General Education information will improve Description: Including pain rating scale, medication(s)/side effects and non-pharmacologic comfort measures Outcome: Progressing Pt understands he was admitted into the hospital for mid chest pain, SOB, and fatigue x1 week r/t anemia with a hemoglobin of 4.  This admission he had 2 units of PRBCs transfused per MD's orders.  Today he had chemotherapy for his new diagnosis of Multiple Myeloma per MD's orders.      Problem: Clinical Measurements: Goal: Ability to maintain clinical measurements within normal limits will improve Outcome: Progressing Pt's was slightly hypertensive this shift.  MD is aware.     Problem: Clinical Measurements: Goal: Will remain free from infection Outcome: Progressing S/Sx of infection monitored and assessed q8 hours.  Pt has remained afebrile thus far.       Problem: Clinical Measurements: Goal: Respiratory complications will improve Outcome: Progressing Respiratory status monitored and assessed q8 hours.  Pt is on room air with PO2 saturations at 99-100% and respirations of 16-18 breaths per minute.  He has not endorse c/o SOB and DOE     Problem: Activity: Goal: Risk for activity intolerance will decrease Outcome: Progressing Pt is a independent of all his ADLs. He was observed getting OOB ambulating with a steady gait in his room and hallways.    Problem: Nutrition: Goal: Adequate nutrition will be maintained Outcome: Progressing Pt is on a regular diet per MD's orders.  He has been able to tolerate his diet without s/sx of n/v or abdominal pain/ distention.     Problem: Elimination: Goal: Will not experience complications related to bowel motility Outcome: Progressing Pt LBM was in 11/14/2022.  He has not endorse c/o constipation.    Problem: Elimination: Goal: Will not experience complications related to urinary retention Outcome: Progressing Pt has denied c/o  dysuria or abdominal distention/ pain.   Problem: Pain Management: Goal: General experience of comfort will improve Outcome: Progressing Pt has denied pain thus far.    Problem: Safety: Goal: Ability to remain free from injury will improve Outcome: Progressing Pt has remained free from falls thus far.  Instructed pt to utilize RN call light for assistance.  Hourly rounds performed.  Bed in lowest position, locked with two upper side rails engaged.  Belongings and call light within reach.    Problem: Skin Integrity: Goal: Risk for impaired skin integrity will decrease Outcome: Progressing Skin integrity monitored and assessed q-shift. Pt is on q2 hourly turns to prevent further skin impairment.  Tubes and drains assessed for device related pressure sores.  Pt is continent of both bowel and bladder.

## 2022-11-14 NOTE — Progress Notes (Signed)
Daily Progress Note Intern Pager: [phone]  Patient name: Drew Cooper Medical record number: [id number] Date of birth: Oct 11, 1965 Age: 57 y.o. Gender: male  Primary Care Provider: Patient, No Pcp Per Consultants: nephrology, oncology Code Status: full code  Pt Overview and Major Events to Date:  8/10 - Admitted with creatinine of 9.15 and Hgb 4.8, transfusion 2 uPRBC 8/11 - 1 uPRBC transfused- post tx H&H 7.3 8/13 - 1 uPRBC transfused for Hgb threshold <8  8/15-  BM biopsy confirmed MM  Assessment and Plan: Drew Cooper is a 57 year-old male with no significant medical history and limited interaction with the healthcare system who presented to the hospital with fatigue and dyspnea. He was found to have significant anemia and markedly elevated creatinine on admission. He is currently stable after multiple blood transfusions. Bone marrow biopsy and other labs confirmed diagnosis of multiple myeloma. Oncology to start palliative chemotherapy treatment today.  [facility]     * (Principal) CKD (chronic kidney disease)     Most likely secondary to multiple myeloma versus primary kidney  disease.  Edema resolved after Lasix, discontinued on 8/11. Documentation  from PCP (in care everywhere) shows creatinine of 5.07, BUN 49, eGFR 12,  calcium 11 (corrected) in May 2024. Investigative nephrology labs  (IFA/ANA, ANCA, ASO, complement) negative.  Tunneled cathether placed 8/15  for PLEX treatment- first tx completed 8/15. -nephro plan for 3-4 more PLEX treatments, can be done outpatient -phosphate binder (Auryxia) TID with meals  - renal function slowly improving        Anemia     Suspect chronic in nature given physical exam.  Most likely etiology  points to chronic kidney failure and/or multiple myeloma. Has received a  total of 3u of PRBCs so far. AM Hgb 8.4 -AM CBC -continuous telemetry -Transfusion threshold <8 -continue to monitor        Multiple myeloma [facility])      Patient with pelvic lucency on CT.  Additionally has hypercalcemia,  anemia, and new onset renal disease. Significantly elevated lambda free  light chain, elevated kappa light chain. Elevated beta-2 microglobulin and  LDH indicate stage 3 disease. SPEP with M-spike in Beta globulin. Urine  protein 8k. Bone marrow bx showed 53% plasma cells. -- Per oncology- plan to treat with Velcade, cyclophosphamide, and  decadron starting while inpatient. First treatment today. May need  whole-body PET scan after discharge  - Transfusion threshold <8         Lytic bone lesions on xray     Osteoarthritis of neck     Reported by patient, having some mild neck pain -lidocaine patch daily -PRN tylenol 650mg       FEN/GI: regular PPx: lovenox Dispo:pending clinical improvement  Subjective:  Patient states he is doing well this morning, having some mild itching and irritation at site of tunneled catheter.  Asking about treatment plan.  Discussed that he will be starting treatment for his multiple myeloma today, oncology should be seeing him and can provide more information.  Patient does not seem to have full grasp of the severity of his disease at this time.  Patient unsure whether wife spoke to social worker about insurance yesterday.  Also asking about nephrology plan, reports next plasmapheresis is scheduled for Saturday.  Objective: Temp:  [97.8 F (36.6 C)-98.2 F (36.8 C)] 98.2 F (36.8 C) (08/16 0859) Pulse Rate:  [64-86] 76 (08/16 0859) Resp:  [16-18] 16 (08/16 0859) BP: (  130-154)/(69-91) 154/91 (08/16 0859) SpO2:  [97 %-100 %] 100 % (08/16 0859) Weight:  [79.6 kg-80 kg] 79.6 kg (08/16 0500) Physical Exam: General: Pleasant, conversant middle-aged man walking around room, in no acute distress Cardiovascular: Regular rate and rhythm, normal S1/S2, no murmurs rubs or gallops Respiratory: Lungs clear to auscultation bilaterally, no wheezes rales or rhonchi Skin: Dressing over  hemodialysis catheter at right shoulder is clean, dry, intact, no surrounding erythema   Laboratory: Most recent CBC Lab Results  Component Value Date   WBC 9.5 11/14/2022   HGB 8.4 (L) 11/14/2022   HCT 26.7 (L) 11/14/2022   MCV 95.7 11/14/2022   PLT 232 11/14/2022   Most recent BMP    Latest Ref Rng & Units 11/14/2022   12:10 AM  BMP  Glucose 70 - 99 mg/dL 88   BUN 6 - 20 mg/dL 56   Creatinine 4.09 - 1.24 mg/dL 8.11   Sodium 914 - 782 mmol/L 137   Potassium 3.5 - 5.1 mmol/L 4.5   Chloride 98 - 111 mmol/L 106   CO2 22 - 32 mmol/L 19   Calcium 8.9 - 10.3 mg/dL 95.6     Other pertinent labs  Bone marrow biopsy:  The bone marrow is hypercellular for age with prominent increase in  atypical plasma cells representing 53% of all cells in the aspirate  associated with interstitial infiltrates, numerous variably sized  aggregates and diffuse sheets in the clot and biopsy sections.  The  plasma cells display lambda light chain restriction consistent with  plasma cell neoplasm.  Correlation with cytogenetic and FISH studies is  recommended.   24 hour urine protein: 8,148  IgG 484 IgA 84  Imaging/Diagnostic Tests: none [name], MD 11/14/2022, 11:10 AM  PGY-1, [facility] Family Medicine FPTS Intern pager: [phone], text pages welcome Secure chat group [facility] Teaching Service

## 2022-11-14 NOTE — Assessment & Plan Note (Signed)
Suspect chronic in nature given physical exam.  Most likely etiology points to chronic kidney failure and/or multiple myeloma. Has received a total of 3u of PRBCs so far. AM Hgb 8.4 -AM CBC -continuous telemetry -Transfusion threshold <8 -continue to monitor

## 2022-11-14 NOTE — Progress Notes (Signed)
Chemo RN presented to patient room. Patient verified with 2 separate identifiers. Role of Chemo RN explained to patient. Consent obtained for administration of Velcade and Cytoxan. 22G IV LFA checked for blood return. Brisk blood return noted. IV flushed easily, without resistance. Placed earlier today, ok for chemo infusion. Verified premeds given. Observed for 30 minutes prior to initiation of chemotherapy treatment. Treatment completed without issue, no complaints from patient. Patient and family (spouse and SIL) educated on chemotherapy precautions, possible side effects. All questions answered to their satisfaction. Chemo RN left patient in stable condition. Primary RN aware that treatment is complete.

## 2022-11-14 NOTE — Discharge Summary (Shared)
Family Medicine Teaching [facility] Discharge Summary  Patient name: Drew Cooper Medical record number: [id number] Date of birth: 08-31-1965 Age: 57 y.o. Gender: male Date of Admission: 11/08/2022  Date of Discharge: 11/15/22 Admitting Physician: Shelby Mattocks, DO  Primary Care Provider: Patient, No Pcp Per, is establishing care with [facility]  Consultants: Nephrology, oncology  Indication for Hospitalization: Fatigue  Discharge Diagnoses/Problem List:  Principal Problem for Admission: Chronic kidney disease Other Problems addressed during stay:  Principal Problem:   Multiple myeloma (HCC) Active Problems:   Anemia   CKD (chronic kidney disease)   Lytic bone lesions on xray   Osteoarthritis of neck    Brief Hospital Course:  Drew Cooper is a 57 y.o.male with a history of tobacco abuse, nephrolithiasis who was admitted to the family medicine teaching Service at [facility] and was found to have advanced multiple myeloma. His hospital course is detailed below:  Chronic kidney disease Presented with fatigue and dyspnea.  In the ED hemoglobin found to be 4.8, creatinine 9.15.  Previous creatinine in January 2.2, then in May 5.0.  Patient recommended to follow-up with allergy per PCP but did not.  Nephrology was consulted.  Renal ultrasound showed bilateral signs of medical renal disease.  Patient got two doses of Lasix for volume reduction.  Extensive workup initiated for cause of chronic renal failure without known chronic disease.  Workup was significant for multiple myeloma.  Nephrology decided to treat patient with plasmapheresis. Patient had a tunneled catheter placed in his right IJ on 815 and completed his first plasmapheresis treatment later that day. Completed second plasmapheresis on 8/17. Nephrology to coordinate remaining plasmapheresis on an outpatient basis. Creatinine slowly improved to 7.19 by the day of discharge.  Normocytic anemia Hemoglobin found to be 4.8  in the ED.  Likely contributing to patient's fatigue.  No obvious signs of bleeding and fecal occult blood test was negative suggesting chronic etiology.  Thought to be secondary to renal disease.  Patient was initially transfused with 2 units of PRBCs and hemoglobin improved to 6.3. Oncology was consulted and recommended transfusion threshold of less than 8.  The patient received another unit of packed red blood cells on 8/13 and his hemoglobin remained greater than 8 for the remainder of the hospital stay.  Concern for multiple myeloma CT abdomen pelvis showed subtle lucency of pelvic bones.  This in the setting of anemia and kidney disease highly suspicious for multiple myeloma.  Lab results showed significantly elevated lambda and kappa free light chains, elevated beta-2 microglobulin, elevated LDH, M spike in the beta globulin on SPEP, and elevated urine protein.  Bone marrow biopsy showed 53% plasma cells, confirming the diagnosis of multiple myeloma.  The patient was started on palliative chemotherapy (velcade and decadron) on 8/16 and tolerated this well. He was given prevnar vaccine and started on azithromycin for viral ppx. Oncology will continue treatments on an outpatient basis.   PCP Follow-up Recommendations: Oncology follow up for palliative chemotherapy treatment scheduled Nephrology follow up for plasmapheresis- they will call patient to schedule Hospital follow up scheduled- f/u PLEX treatments, chemo   Disposition: Home  Discharge Condition: Stable  Discharge Exam:  Vitals:   11/15/22 1241 11/15/22 1306  BP: 129/68 124/68  Pulse: 68 69  Resp: 16 (!) 22  Temp:  98.7 F (37.1 C)  SpO2: 100% 100%   General: Pleasant, conversant, no acute distress Cardiovascular: Regular rate and rhythm, normal S1/S2, no murmurs, rubs, gallops Respiratory: Clear to auscultation bilaterally,  no wheezes, rales, rhonchi Abdomen: Bowel sounds present, soft, nontender,  nondistended  Significant Procedures: Bone marrow biopsy, tunneled catheter placement and plasmapheresis  Significant Labs and Imaging:  Recent Labs  Lab 11/14/22 0010 11/15/22 0036  WBC 9.5 13.6*  HGB 8.4* 8.2*  HCT 26.7* 24.8*  PLT 232 223   Recent Labs  Lab 11/14/22 0010 11/15/22 0036  NA 137 133*  K 4.5 5.0  CL 106 99  CO2 19* 20*  GLUCOSE 88 251*  BUN 56* 67*  CREATININE 7.15* 7.19*  CALCIUM 10.0 10.4*  PHOS 6.8* 5.0*  ALBUMIN 3.6 3.4*   Kappa free light chain 25.5 Lambda free light chain 78295 Beta-2 microglobulin 28.4 Beta globulin 1.9 with M spike LDH 213 Urine protein 8148  Pertinent Imaging  Bone mets survey Multiple lytic lesions in the appendicular skeleton, as above, concerning for multiple myeloma or less likely metastatic disease.  Renal ultrasound Bilateral echogenic kidneys which may be secondary to medical renal disease.   CT abdomen pelvis Subtle lucent lesions in the pelvic bones. Recommend correlation with myeloma serology. No acute findings abdomen pelvis. Bilateral pleural effusions.   Results/Tests Pending at Time of Discharge: None  Discharge Medications:  Allergies as of 11/15/2022   No Known Allergies      Medication List     STOP taking these medications    meloxicam 15 MG tablet Commonly known as: MOBIC       TAKE these medications    acetaminophen 500 MG tablet Commonly known as: TYLENOL Take 500 mg by mouth every 6 (six) hours as needed for moderate pain.   acyclovir 200 MG capsule Commonly known as: ZOVIRAX Take 1 capsule (200 mg total) by mouth daily. Start taking on: November 16, 2022   Auryxia 1 GM 210 MG(Fe) tablet Generic drug: ferric citrate Take 2 tablets (420 mg total) by mouth 3 (three) times daily with meals.   lidocaine 5 % Commonly known as: LIDODERM Place 1 patch onto the skin daily.   sodium bicarbonate 650 MG tablet Take 1 tablet (650 mg total) by mouth 2 (two) times daily.         Discharge Instructions: Please refer to Patient Instructions section of EMR for full details.  Patient was counseled important signs and symptoms that should prompt return to medical care, changes in medications, dietary instructions, activity restrictions, and follow up appointments.   Follow-Up Appointments:  Follow-up Information     McDiarmid, Leighton Roach, MD Follow up.   Specialty: Family Medicine Why: Follow up with Dr. Jena Gauss on August 23rd, at 8:50 am. Contact information: [address] [place] [address] [phone]                Upper Level Attestation I have seen and examined the patient with the resident Dr. Dolan Amen. I agree with the history, physical, and assessment above with any necessary edits.   Lockie Mola, MD 11/15/2022, 6:21 PM PGY-2, [facility] Family Medicine

## 2022-11-14 NOTE — Progress Notes (Signed)
  Spoke with Dr. Arbutus Ped re: antiviral prophylaxis required while on Velcade.  OK to start Acyclovir 200mg  daily (dose appropriate based on renal function).  Orders placed.  [employer], Pharm.D., BCPS Clinical Pharmacist 11/14/2022 1:19 PM

## 2022-11-15 ENCOUNTER — Other Ambulatory Visit (HOSPITAL_COMMUNITY): Payer: Self-pay

## 2022-11-15 ENCOUNTER — Encounter: Payer: Self-pay | Admitting: Internal Medicine

## 2022-11-15 ENCOUNTER — Other Ambulatory Visit: Payer: Self-pay | Admitting: Physician Assistant

## 2022-11-15 ENCOUNTER — Other Ambulatory Visit: Payer: Self-pay

## 2022-11-15 DIAGNOSIS — N179 Acute kidney failure, unspecified: Secondary | ICD-10-CM | POA: Diagnosis not present

## 2022-11-15 DIAGNOSIS — C9000 Multiple myeloma not having achieved remission: Secondary | ICD-10-CM

## 2022-11-15 LAB — CBC
HCT: 24.8 % — ABNORMAL LOW (ref 39.0–52.0)
Hemoglobin: 8.2 g/dL — ABNORMAL LOW (ref 13.0–17.0)
MCH: 31.8 pg (ref 26.0–34.0)
MCHC: 33.1 g/dL (ref 30.0–36.0)
MCV: 96.1 fL (ref 80.0–100.0)
Platelets: 223 10*3/uL (ref 150–400)
RBC: 2.58 MIL/uL — ABNORMAL LOW (ref 4.22–5.81)
RDW: 19 % — ABNORMAL HIGH (ref 11.5–15.5)
WBC: 13.6 10*3/uL — ABNORMAL HIGH (ref 4.0–10.5)
nRBC: 0 % (ref 0.0–0.2)

## 2022-11-15 LAB — RENAL FUNCTION PANEL
Albumin: 3.4 g/dL — ABNORMAL LOW (ref 3.5–5.0)
Anion gap: 14 (ref 5–15)
BUN: 67 mg/dL — ABNORMAL HIGH (ref 6–20)
CO2: 20 mmol/L — ABNORMAL LOW (ref 22–32)
Calcium: 10.4 mg/dL — ABNORMAL HIGH (ref 8.9–10.3)
Chloride: 99 mmol/L (ref 98–111)
Creatinine, Ser: 7.19 mg/dL — ABNORMAL HIGH (ref 0.61–1.24)
GFR, Estimated: 8 mL/min — ABNORMAL LOW (ref >60)
Glucose, Bld: 251 mg/dL — ABNORMAL HIGH (ref 70–99)
Phosphorus: 5 mg/dL — ABNORMAL HIGH (ref 2.5–4.6)
Potassium: 5 mmol/L (ref 3.5–5.1)
Sodium: 133 mmol/L — ABNORMAL LOW (ref 135–145)

## 2022-11-15 MED ORDER — SODIUM CHLORIDE 0.9 % IV SOLN: Freq: Once | INTRAVENOUS | Status: DC

## 2022-11-15 MED ORDER — CALCIUM GLUCONATE-NACL 2-0.675 GM/100ML-% IV SOLN
2.0000 g | Freq: Once | INTRAVENOUS | Status: AC
  Administered 2022-11-15: 2000 mg via INTRAVENOUS
  Filled 2022-11-15: qty 100

## 2022-11-15 MED ORDER — ACYCLOVIR 200 MG PO CAPS
200.0000 mg | ORAL_CAPSULE | Freq: Every day | ORAL | 0 refills | Status: DC
  Filled 2022-11-15: qty 30, 30d supply, fill #0

## 2022-11-15 MED ORDER — ACETAMINOPHEN 325 MG PO TABS
650.0000 mg | ORAL_TABLET | ORAL | Status: DC | PRN
  Administered 2022-11-15: 650 mg via ORAL

## 2022-11-15 MED ORDER — ACD FORMULA A 0.73-2.45-2.2 GM/100ML VI SOLN
1000.0000 mL | Status: DC
  Administered 2022-11-15: 1000 mL
  Filled 2022-11-15: qty 1000

## 2022-11-15 MED ORDER — CALCIUM GLUCONATE-NACL 2-0.675 GM/100ML-% IV SOLN
INTRAVENOUS | Status: AC
  Filled 2022-11-15: qty 100

## 2022-11-15 MED ORDER — CALCIUM CARBONATE ANTACID 500 MG PO CHEW
2.0000 | CHEWABLE_TABLET | ORAL | Status: DC
  Administered 2022-11-15: 400 mg via ORAL
  Filled 2022-11-15 (×2): qty 2

## 2022-11-15 MED ORDER — DIPHENHYDRAMINE HCL 25 MG PO CAPS
25.0000 mg | ORAL_CAPSULE | Freq: Four times a day (QID) | ORAL | Status: DC | PRN
  Administered 2022-11-15: 25 mg via ORAL
  Filled 2022-11-15: qty 1

## 2022-11-15 MED ORDER — ALUM & MAG HYDROXIDE-SIMETH 200-200-20 MG/5ML PO SUSP
15.0000 mL | Freq: Four times a day (QID) | ORAL | Status: DC | PRN
  Administered 2022-11-15: 15 mL via ORAL
  Filled 2022-11-15: qty 30

## 2022-11-15 MED ORDER — SODIUM CHLORIDE 0.9 % IV SOLN
INTRAVENOUS | Status: AC
  Filled 2022-11-15 (×4): qty 200

## 2022-11-15 MED ORDER — FERRIC CITRATE 1 GM 210 MG(FE) PO TABS
420.0000 mg | ORAL_TABLET | Freq: Three times a day (TID) | ORAL | 0 refills | Status: DC
  Filled 2022-11-15: qty 120, 20d supply, fill #0

## 2022-11-15 MED ORDER — HEPARIN SODIUM (PORCINE) 1000 UNIT/ML IJ SOLN
1000.0000 [IU] | Freq: Once | INTRAMUSCULAR | Status: AC
  Administered 2022-11-15: 1000 [IU]

## 2022-11-15 MED ORDER — SODIUM BICARBONATE 650 MG PO TABS
650.0000 mg | ORAL_TABLET | Freq: Two times a day (BID) | ORAL | 0 refills | Status: DC
  Filled 2022-11-15: qty 60, 30d supply, fill #0

## 2022-11-15 NOTE — Plan of Care (Signed)
Problem: Education: Goal: Knowledge of General Education information will improve Description: Including pain rating scale, medication(s)/side effects and non-pharmacologic comfort measures Outcome: Progressing Pt understands he was admitted into the hospital for mid chest pain, SOB, and fatigue x1 week r/t anemia with a hemoglobin of 4.  This admission he had 2 units of PRBCs transfused per MD's orders.  He had chemotherapy for his new diagnosis of Multiple Myeloma per MD's orders on 11/14/2022.  Pt is currently receiving apheresis treatment per MD's orders.  Pending discharge home soon to self care.      Problem: Clinical Measurements: Goal: Ability to maintain clinical measurements within normal limits will improve Outcome: Progressing Pt's VS WNL thus far.    Problem: Clinical Measurements: Goal: Will remain free from infection Outcome: Progressing S/Sx of infection monitored and assessed q8 hours.  Pt has remained afebrile thus far.       Problem: Clinical Measurements: Goal: Respiratory complications will improve Outcome: Progressing Respiratory status monitored and assessed q8 hours.  Pt is on room air with PO2 saturations at 100% and respirations of 16 breaths per minute.  He has not endorse c/o SOB and DOE     Problem: Activity: Goal: Risk for activity intolerance will decrease Outcome: Progressing Pt is a independent of all his ADLs. He was observed getting OOB ambulating with a steady gait in his room and hallways.    Problem: Nutrition: Goal: Adequate nutrition will be maintained Outcome: Progressing Pt is on a renal diet with low potassium per MD's orders.  He has been able to tolerate his diet without s/sx of n/v or abdominal pain/ distention.     Problem: Elimination: Goal: Will not experience complications related to bowel motility Outcome: Progressing Pt LBM was in 11/15/2022.  He has not endorse c/o constipation.    Problem: Elimination: Goal: Will not  experience complications related to urinary retention Outcome: Progressing Pt has denied c/o dysuria or abdominal distention/ pain.   Problem: Pain Management: Goal: General experience of comfort will improve Outcome: Progressing Pt has denied pain thus far.    Problem: Safety: Goal: Ability to remain free from injury will improve Outcome: Progressing Pt has remained free from falls thus far.  Instructed pt to utilize RN call light for assistance.  Hourly rounds performed.  Bed in lowest position, locked with two upper side rails engaged.  Belongings and call light within reach.    Problem: Skin Integrity: Goal: Risk for impaired skin integrity will decrease Outcome: Progressing Skin integrity monitored and assessed q-shift. Pt is on q2 hourly turns to prevent further skin impairment.  Tubes and drains assessed for device related pressure sores.  Pt is continent of both bowel and bladder.

## 2022-11-15 NOTE — Progress Notes (Signed)
Pt to be d/c home to self care. Reviewed AVS with pt, changes to his medication list and made him aware of f/u appointments with his PCP. Informed pt that he had pending prescriptions to be picked up from the Outpatient Pharmacy ([facility]).  Answered any pending questions. Pt had no further questions. Removed PIV which was CDI and free from s/sx of infection upon removal. Pt has a perm cath to his right chest-wall for apheresis.  Taught and educated pt on perm-cath care and prevention of CLABSI.  Helped pt in gathering his belongings and getting dressed. Pt is ambulatory and walked to the front of the hospital accompanied by his wife where his ride awaits to take him home. Pt d/c in stable condition.

## 2022-11-15 NOTE — Assessment & Plan Note (Signed)
 Reported by patient, having some mild neck pain -lidocaine patch daily -PRN tylenol 650mg 

## 2022-11-15 NOTE — Assessment & Plan Note (Addendum)
Most likely secondary to multiple myeloma versus primary kidney disease.  Edema resolved after Lasix, discontinued on 8/11. Documentation from PCP (in care everywhere) shows creatinine of 5.07, BUN 49, eGFR 12, calcium 11 (corrected) in May 2024. Investigative nephrology labs (IFA/ANA, ANCA, ASO, complement) negative.  Tunneled cathether placed 8/15 for PLEX treatment- first tx completed 8/15. Phosphorus improved from 8.2 on admission to 5.0 -PLEX today -nephro plan for 3-4 more PLEX treatments, can be done outpatient -phosphate binder (Auryxia) TID with meals  - renal function slowly improving

## 2022-11-15 NOTE — Assessment & Plan Note (Addendum)
Patient with pelvic lucency on CT.  Additionally has hypercalcemia, anemia, and new onset renal disease. Significantly elevated lambda free light chain, elevated kappa light chain. Elevated beta-2 microglobulin and LDH indicate stage 3 disease. SPEP with M-spike in Beta globulin. Urine protein 8k. Bone marrow bx showed 53% plasma cells. Tolerated first chemo treatment well.  -- Okay to d/c today per onc, has follow up scheduled at [facility] - Transfusion threshold <8

## 2022-11-15 NOTE — Progress Notes (Signed)
Daily Progress Note Intern Pager: [phone]  Patient name: Drew Cooper Medical record number: [id number] Date of birth: [birth year] Age: 57 y.o. Gender: male  Primary Care Provider: Patient, No Pcp Per Consultants: nephrology, oncology Code Status: Full code  Pt Overview and Major Events to Date:  8/10 - Admitted with creatinine of 9.15 and Hgb 4.8, transfusion 2 uPRBC 8/11 - 1 uPRBC transfused- post tx H&H 7.3 8/13 - 1 uPRBC transfused for Hgb threshold <8  8/15-  BM biopsy confirmed MM 8/16- completed first round of chemo, Velcade and Cytoxan  Assessment and Plan: Mr Prestenbach is a 57 year-old male with no significant medical history and limited interaction with the healthcare system who presented to the hospital with fatigue and dyspnea. He was found to have significant anemia and markedly elevated creatinine on admission. He is currently stable after multiple blood transfusions. Bone marrow biopsy and other labs confirmed diagnosis of multiple myeloma. Received first round of palliative chemotherapy treatment yesterday. Will have second plasmapheresis treatment today and discharge home after this pending clinical stability.    Assessment & Plan Multiple myeloma [facility]) Patient with pelvic lucency on CT.  Additionally has hypercalcemia, anemia, and new onset renal disease. Significantly elevated lambda free light chain, elevated kappa light chain. Elevated beta-2 microglobulin and LDH indicate stage 3 disease. SPEP with M-spike in Beta globulin. Urine protein 8k. Bone marrow bx showed 53% plasma cells. Tolerated first chemo treatment well.  -- Okay to d/c today per onc, has follow up scheduled at [facility] - Transfusion threshold <8  Anemia Suspect chronic in nature given physical exam.  Most likely etiology points to chronic kidney failure and/or multiple myeloma. Has received a total of 3u of PRBCs so far. AM Hgb 8.2 -AM CBC -continuous telemetry -Transfusion  threshold <8 -continue to monitor CKD (chronic kidney disease) Most likely secondary to multiple myeloma versus primary kidney disease.  Edema resolved after Lasix, discontinued on 8/11. Documentation from PCP (in care everywhere) shows creatinine of 5.07, BUN 49, eGFR 12, calcium 11 (corrected) in May 2024. Investigative nephrology labs (IFA/ANA, ANCA, ASO, complement) negative.  Tunneled cathether placed 8/15 for PLEX treatment- first tx completed 8/15. Phosphorus improved from 8.2 on admission to 5.0 -PLEX today -nephro plan for 3-4 more PLEX treatments, can be done outpatient -phosphate binder (Auryxia) TID with meals  - renal function slowly improving Lytic bone lesions on xray  Osteoarthritis of neck Reported by patient, having some mild neck pain -lidocaine patch daily -PRN tylenol 650mg    FEN/GI: regular PPx: lovenox Dispo:possibly home today pending clinical improvement  Subjective:  Patient resting comfortably in room this morning.  States he did well with the first chemotherapy treatment yesterday, complaining of some heartburn.  Plan to speak with cancer center case manager about insurance at follow-up appointment.  Objective: Temp:  [97.9 F (36.6 C)-98.4 F (36.9 C)] 97.9 F (36.6 C) (08/17 0735) Pulse Rate:  [72-89] 79 (08/17 0735) Resp:  [16-18] 16 (08/17 0735) BP: (119-161)/(78-97) 119/82 (08/17 0735) SpO2:  [97 %-100 %] 100 % (08/17 0735) Weight:  [80.9 kg] 80.9 kg (08/17 0700) Physical Exam: General: Pleasant, conversant, no acute distress Cardiovascular: Regular rate and rhythm, normal S1/S2, no murmurs, rubs, gallops Respiratory: Clear to auscultation bilaterally, no wheezes, rales, rhonchi Abdomen: Bowel sounds present, soft, nontender, nondistended  Laboratory: Most recent CBC Lab Results  Component Value Date   WBC 13.6 (H) 11/15/2022   HGB 8.2 (L) 11/15/2022   HCT 24.8 (L) 11/15/2022  MCV 96.1 11/15/2022   PLT 223 11/15/2022   Most recent  BMP    Latest Ref Rng & Units 11/15/2022   12:36 AM  BMP  Glucose 70 - 99 mg/dL 578   BUN 6 - 20 mg/dL 67   Creatinine 4.69 - 1.24 mg/dL 6.29   Sodium 528 - 413 mmol/L 133   Potassium 3.5 - 5.1 mmol/L 5.0   Chloride 98 - 111 mmol/L 99   CO2 22 - 32 mmol/L 20   Calcium 8.9 - 10.3 mg/dL 24.4      Imaging/Diagnostic Tests: none [name], MD 11/15/2022, 8:27 AM  PGY-1, [facility] FPTS Intern pager: [phone], text pages welcome Secure chat group [facility] Teaching Service

## 2022-11-15 NOTE — Progress Notes (Signed)
TPE  11/15/22 1306  Vitals  Temp 98.7 F (37.1 C)  Temp Source Oral  Pulse Rate 69  ECG Heart Rate 70  Resp (!) 22  BP 124/68  Oxygen Therapy  SpO2 100 %  O2 Device Room Air  Patient Activity (if Appropriate) In bed  Pulse Oximetry Type Continuous  Pain Assessment  Pain Scale 0-10  Pain Score 0  Post-apheresis  Net Removed (mL) 4540 mL  Replacement (mL) 3751 mL  ACDA infused (mL) 644 mL  Total Normal Saline Administered 160 mL  Total Calcium Administered 100 mL  Tolerated Procedure Yes  Post-Procedure Comments Patient rinsed back; Completed treatment without difficulty  Hemodialysis Catheter Right Internal jugular Double lumen Permanent (Tunneled)  Placement Date/Time: 11/13/22 0859   Placed prior to admission: No  Serial / Lot #: [id number]  Expiration Date: 03/31/27  Time Out: Correct patient;Correct site;Correct procedure  Maximum sterile barrier precautions: Hand hygiene;Cap;Mask;Sterile gown...  Site Condition No complications  Blue Lumen Status Flushed;Heparin locked;Blood return noted;Dead end cap in place  Red Lumen Status Flushed;Blood return noted;Heparin locked;Dead end cap in place  Purple Lumen Status N/A  Catheter fill solution Heparin 1000 units/ml  Catheter fill volume (Arterial) 1.6 cc  Catheter fill volume (Venous) 1.6  Dressing Type Transparent  Dressing Status Antimicrobial disc in place  Drainage Description None  Dressing Change Due 11/20/22  Post treatment catheter status Capped and Clamped

## 2022-11-15 NOTE — Discharge Instructions (Signed)
Dear Drew Cooper,  It was a pleasure being involved in your care.  You were admitted to the family medicine teaching service for complaints of fatigue and difficulty breathing with movement. Labs at that time were very concerning for a low blood count and an increased creatinine suggestive of kidney failure.  Images were taken of your kidney which showed concerning signs of disease and necessitated a bone marrow biopsy which was positive for multiple myeloma.  Nephrology recommended treatment with plasmapheresis and you had a catheter placed on August 15.  Please continue to follow-up for plasmapheresis with nephrology on a outpatient basis.  You were evaluated by the oncologist, who recommended treatment with palliative chemotherapy and other agents.  Please continue these treatments on an outpatient basis.  Given your low red blood cell count was during inpatient you received multiple blood transfusions to increase your blood levels.  Best of health going forward,  The family medicine teaching service

## 2022-11-15 NOTE — Assessment & Plan Note (Signed)
Suspect chronic in nature given physical exam.  Most likely etiology points to chronic kidney failure and/or multiple myeloma. Has received a total of 3u of PRBCs so far. AM Hgb 8.2 -AM CBC -continuous telemetry -Transfusion threshold <8 -continue to monitor

## 2022-11-15 NOTE — Progress Notes (Unsigned)
[facility] OFFICE PROGRESS NOTE  System, Provider Not In No address on file  DIAGNOSIS: Recently diagnosed with multiple myeloma with lytic bone lesions in addition to renal insufficiency and bone marrow biopsy and aspirate consistent with plasma cell neoplasm with 53% plasma cells. This was diagnosed in August 2024.   PRIOR THERAPY: plasmapheresis status post 2 treatments.   CURRENT THERAPY: First dose of treatment given while inpatient with  subcutaneous Velcade 1.3 Mg/KG on days 1, 4, 8 and 11 every 3 weeks, cyclophosphamide 500 Mg/M2 IV on days 1 and 8 as well as Decadron 40 mg p.o. on weekly basis start with the first dose of Velcade. Once his renal function improves, Dr. Arbutus Ped will likely recommend changing treatment to standard treatment with daratumumab, Velcade, Revlimid and Decadron for 4 cycles followed by evaluation for autologous stem cell transplant.   INTERVAL HISTORY: Drew Cooper 57 y.o. male returns to the clinic today for a follow-up visit accompanied by his wife.  The patient is here to establish care on an outpatient basis.  In summary, the patient was recently hospitalized from 8/10-8/16.  The patient presented to the emergency room with severe anemia, markedly elevated creatinine, and hypercalcemia.  The patient received several blood transfusions.  He had pelvic lucencies on CT scan, significantly elevated lambda free light chains, elevated kappa light chain, elevated beta-2 microglobulin, and LDH.  He also had an SPEP and M spike and urine protein of 8g  He subsequently had a bone marrow biopsy that showed 53% plasma cells.  The patient was diagnosed with multiple myeloma.  Dr. Arbutus Ped saw the patient to establish care while inpatient.  The patient received plasma pheresis 2x.  He is supposed to get plasmapheresis again tomorrow, Thursday, and Saturday.  He is not sure where to go for this.  He also underwent his first cycle of treatment with  subcutaneous Velcade and Decadron while admitted on 11/14/2022.  Dr. Arbutus Ped recommends a PET scan on outpatient basis for staging.  Since being discharged, he is feeling "pretty good".  He is compliant with his acyclovir.  He reports that his fatigue and weakness is "okay".  He denies any fever, chills, night sweats, or unexplained weight loss.  He is having rash and itching where his Lidoderm patch was.  He is wondering what he can put on the site.  He denies any signs and symptoms of infection at this time including nasal congestion, sore throat, skin infections, or dysuria.  He reports some bruising on his extremities due to his IV sites but denies any abnormal bleeding.  Denies any unusual bone pain.  He denies any rashes or skin changes.  He denies any peripheral neuropathy.  Denies nausea, vomiting, diarrhea, or constipation.  He is here today for evaluation, to establish care in the clinic, and to undergo day for cycle 1    MEDICAL HISTORY: Past Medical History:  Diagnosis Date   Cancer (HCC)     ALLERGIES:  has No Known Allergies.  MEDICATIONS:  Current Outpatient Medications  Medication Sig Dispense Refill   dexamethasone (DECADRON) 4 MG tablet Take 5 tablets by mouth once a week 40 tablet 2   prochlorperazine (COMPAZINE) 10 MG tablet Take 1 tablet (10 mg total) by mouth every 6 (six) hours as needed. 30 tablet 2   acetaminophen (TYLENOL) 500 MG tablet Take 500 mg by mouth every 6 (six) hours as needed for moderate pain.     acyclovir (ZOVIRAX) 200 MG capsule Take  1 capsule (200 mg total) by mouth daily. 30 capsule 0   ferric citrate (AURYXIA) 1 GM 210 MG(Fe) tablet Take 2 tablets (420 mg total) by mouth 3 (three) times daily with meals. 180 tablet 0   lidocaine (LIDODERM) 5 % Place 1 patch onto the skin daily.     sodium bicarbonate 650 MG tablet Take 1 tablet (650 mg total) by mouth 2 (two) times daily. 60 tablet 0   No current facility-administered medications for this visit.     SURGICAL HISTORY:  Past Surgical History:  Procedure Laterality Date   IR FLUORO GUIDE CV LINE RIGHT  11/13/2022   IR US GUIDE VASC ACCESS RIGHT  11/13/2022   KNEE SURGERY      REVIEW OF SYSTEMS:   Review of Systems  Constitutional: Positive for stable fatigue. Negative for appetite change, chills,  fever and unexpected weight change.  HENT:   Negative for mouth sores, nosebleeds, sore throat and trouble swallowing.   Eyes: Negative for eye problems and icterus.  Respiratory: Negative for cough, hemoptysis, shortness of breath and wheezing.   Cardiovascular: Negative for chest pain and leg swelling.  Gastrointestinal: Negative for abdominal pain, constipation, diarrhea, nausea and vomiting.  Genitourinary: Negative for bladder incontinence, difficulty urinating, dysuria, frequency and hematuria.   Musculoskeletal: Negative for back pain, gait problem, neck pain and neck stiffness.  Skin: Positive for rash over Lidoderm site. Neurological: Negative for dizziness, extremity weakness, gait problem, headaches, light-headedness and seizures.  Hematological: Negative for adenopathy. Does not bleed easily.  Positive for bruising. Psychiatric/Behavioral: Negative for confusion, depression and sleep disturbance. The patient is not nervous/anxious.     PHYSICAL EXAMINATION:  Blood pressure (!) 148/84, pulse 84, temperature 98 F (36.7 C), temperature source Oral, resp. rate 18, weight 183 lb (83 kg), SpO2 100%.  ECOG PERFORMANCE STATUS: 1  Physical Exam  Constitutional: Oriented to person, place, and time and well-developed, well-nourished, and in no distress.  HENT:  Head: Normocephalic and atraumatic.  Mouth/Throat: Oropharynx is clear and moist. No oropharyngeal exudate.  Eyes: Conjunctivae are normal. Right eye exhibits no discharge. Left eye exhibits no discharge. No scleral icterus.  Neck: Normal range of motion. Neck supple.  Cardiovascular: Normal rate, regular rhythm, normal  heart sounds and intact distal pulses.   Pulmonary/Chest: Effort normal and breath sounds normal. No respiratory distress. No wheezes. No rales.  Abdominal: Soft. Bowel sounds are normal. Exhibits no distension and no mass. There is no tenderness.  Musculoskeletal: Normal range of motion. Exhibits no edema.  Lymphadenopathy:    No cervical adenopathy.  Neurological: Alert and oriented to person, place, and time. Exhibits normal muscle tone. Gait normal. Coordination normal.  Skin: Positive for bruising.  Skin is warm and dry. No rash noted. Not diaphoretic. No erythema. No pallor.  Psychiatric: Mood, memory and judgment normal.  Vitals reviewed.  LABORATORY DATA: Lab Results  Component Value Date   WBC 6.7 11/17/2022   HGB 8.3 (L) 11/17/2022   HCT 26.6 (L) 11/17/2022   MCV 98.2 11/17/2022   PLT 198 11/17/2022      Chemistry      Component Value Date/Time   NA 133 (L) 11/15/2022 0036   K 5.0 11/15/2022 0036   CL 99 11/15/2022 0036   CO2 20 (L) 11/15/2022 0036   BUN 67 (H) 11/15/2022 0036   CREATININE 7.19 (H) 11/15/2022 0036      Component Value Date/Time   CALCIUM 10.4 (H) 11/15/2022 0036   ALKPHOS 65 11/08/2022 1045  AST 45 (H) 11/08/2022 1045   ALT 56 (H) 11/08/2022 1045   BILITOT 0.7 11/08/2022 1045       RADIOGRAPHIC STUDIES:  IR Fluoro Guide CV Line Right  Result Date: 11/13/2022 INDICATION: Acute kidney injury EXAM: Tunneled hemodialysis catheter MEDICATIONS: Ancef 2 g IV; The antibiotic was administered within an appropriate time interval prior to skin puncture. ANESTHESIA/SEDATION: Moderate (conscious) sedation was employed during this procedure. A total of Versed 2 mg and Fentanyl 50 mcg was administered intravenously by the radiology nurse. Total intra-service moderate Sedation Time: 7 minutes. The patient's level of consciousness and vital signs were monitored continuously by radiology nursing throughout the procedure under my direct supervision. FLUOROSCOPY:  Radiation Exposure Index (as provided by the fluoroscopic device): 6 mGy Kerma COMPLICATIONS: None immediate. PROCEDURE: Informed written consent was obtained from the patient after a thorough discussion of the procedural risks, benefits and alternatives. All questions were addressed. Maximal Sterile Barrier Technique was utilized including caps, mask, sterile gowns, sterile gloves, sterile drape, hand hygiene and skin antiseptic. A timeout was performed prior to the initiation of the procedure. The right internal jugular vein was evaluated with ultrasound and shown to be patent. A permanent ultrasound image was obtained and placed in the patient's medical record. Using sterile gel and a sterile probe cover, the right internal jugular vein was entered with a 21 ga needle during real time ultrasound guidance. 0.018 inch guidewire placed and 21 ga needle exchanged for transitional dilator set. Utilizing fluoroscopy, 0.035 inch guidewire advanced through the dilator without difficulty. Seriel dilation was performed and peel-away sheath was placed. Attention then turned to the right anterior upper chest. Following local lidocaine administration, the hemodialysis catheter was tunneled from the chest wall to the venotomy site. The catheter was inserted through the peel-away sheath. The tip of the catheter was positioned within the right atrium using fluoroscopic guidance. All lumens of the catheter aspirated and flushed well. The dialysis lumens were locked with Heparin. The catheter was secured to the skin with suture. The insertion site was covered with sterile dressing. IMPRESSION: 19 cm tunneled right IJ hemodialysis catheter is ready for use. Electronically Signed   By: Acquanetta Belling M.D.   On: 11/13/2022 09:51   IR US Guide Vasc Access Right  Result Date: 11/13/2022 INDICATION: Acute kidney injury EXAM: Tunneled hemodialysis catheter MEDICATIONS: Ancef 2 g IV; The antibiotic was administered within an appropriate  time interval prior to skin puncture. ANESTHESIA/SEDATION: Moderate (conscious) sedation was employed during this procedure. A total of Versed 2 mg and Fentanyl 50 mcg was administered intravenously by the radiology nurse. Total intra-service moderate Sedation Time: 7 minutes. The patient's level of consciousness and vital signs were monitored continuously by radiology nursing throughout the procedure under my direct supervision. FLUOROSCOPY: Radiation Exposure Index (as provided by the fluoroscopic device): 6 mGy Kerma COMPLICATIONS: None immediate. PROCEDURE: Informed written consent was obtained from the patient after a thorough discussion of the procedural risks, benefits and alternatives. All questions were addressed. Maximal Sterile Barrier Technique was utilized including caps, mask, sterile gowns, sterile gloves, sterile drape, hand hygiene and skin antiseptic. A timeout was performed prior to the initiation of the procedure. The right internal jugular vein was evaluated with ultrasound and shown to be patent. A permanent ultrasound image was obtained and placed in the patient's medical record. Using sterile gel and a sterile probe cover, the right internal jugular vein was entered with a 21 ga needle during real time ultrasound guidance. 0.018 inch guidewire  placed and 21 ga needle exchanged for transitional dilator set. Utilizing fluoroscopy, 0.035 inch guidewire advanced through the dilator without difficulty. Seriel dilation was performed and peel-away sheath was placed. Attention then turned to the right anterior upper chest. Following local lidocaine administration, the hemodialysis catheter was tunneled from the chest wall to the venotomy site. The catheter was inserted through the peel-away sheath. The tip of the catheter was positioned within the right atrium using fluoroscopic guidance. All lumens of the catheter aspirated and flushed well. The dialysis lumens were locked with Heparin. The catheter  was secured to the skin with suture. The insertion site was covered with sterile dressing. IMPRESSION: 19 cm tunneled right IJ hemodialysis catheter is ready for use. Electronically Signed   By: Acquanetta Belling M.D.   On: 11/13/2022 09:51   CT BONE MARROW BIOPSY & ASPIRATION  Result Date: 11/11/2022 INDICATION: Multiple myeloma EXAM: CT GUIDED LEFT ILIAC BONE MARROW ASPIRATION AND CORE BIOPSY Date:  11/11/2022 11/11/2022 10:07 am Radiologist:  M. Ruel Favors, MD Guidance:  CT FLUOROSCOPY: Fluoroscopy Time: NONE. MEDICATIONS: 1% lidocaine local ANESTHESIA/SEDATION: 1.0 mg IV Versed; 50 mcg IV Fentanyl Moderate Sedation Time:  10 minute The patient was continuously monitored during the procedure by the interventional radiology nurse under my direct supervision. CONTRAST:  None COMPLICATIONS: None immediate PROCEDURE: Informed consent was obtained from the patient following explanation of the procedure, risks, benefits and alternatives. The patient understands, agrees and consents for the procedure. All questions were addressed. A time out was performed. The patient was positioned prone and non-contrast localization CT was performed of the pelvis to demonstrate the iliac marrow spaces. Maximal barrier sterile technique utilized including caps, mask, sterile gowns, sterile gloves, large sterile drape, hand hygiene, and Betadine prep. Under sterile conditions and local anesthesia, an 11 gauge coaxial bone biopsy needle was advanced into the left iliac marrow space. Needle position was confirmed with CT imaging. Initially, bone marrow aspiration was performed. Next, the 11 gauge outer cannula was utilized to obtain a left iliac bone marrow core biopsy. Needle was removed. Hemostasis was obtained with compression. The patient tolerated the procedure well. Samples were prepared with the cytotechnologist. No immediate complications. IMPRESSION: CT guided left iliac bone marrow aspiration and core biopsy. Electronically  Signed   By: Judie Petit.  Shick M.D.   On: 11/11/2022 10:41   DG Bone Survey Met  Result Date: 11/09/2022 CLINICAL DATA:  Renal failure, lytic lesion of hip EXAM: METASTATIC BONE SURVEY COMPARISON:  Partial comparison to CT abdomen/pelvis dated 11/08/2022 FINDINGS: No definite lytic calvarial lesions. Mild degenerative changes of the lower cervical spine, without definite lytic lesions. Mild compression fracture deformities of multiple midthoracic vertebral bodies, without definite lytic lesions. Degenerative changes. Mild degenerative changes of the lumbar spine, without definite lytic lesions. Small lytic lesions in the bilateral humeri. Small lytic lesion in the left proximal ulna. No lytic lesions in the right forearm. Mild increased interstitial markings in the lungs, without focal consolidation. The heart is normal in size. Thoracic aortic atherosclerosis. Suspected small pleural effusions on the lateral view (excluded on the frontal radiograph). Known lytic lesions in the iliac bones, including the right inferior pubic ramus, although better evaluated on CT. Multiple lytic lesions in the femurs bilaterally. No definite lytic lesions in the bilateral tibia/fibula. IMPRESSION: Multiple lytic lesions in the appendicular skeleton, as above, concerning for multiple myeloma or less likely metastatic disease. Electronically Signed   By: Charline Bills M.D.   On: 11/09/2022 19:03   US RENAL  Result Date: 11/08/2022 CLINICAL DATA:  Acute kidney injury. EXAM: RENAL / URINARY TRACT ULTRASOUND COMPLETE COMPARISON:  None Available. FINDINGS: Right Kidney: Renal measurements: 10.6 cm x 4.1 cm x 4.5 cm = volume: 103 mL. Diffusely increased echogenicity of the renal parenchyma is noted. No mass or hydronephrosis visualized. Left Kidney: Renal measurements: 11.3 cm x 4.3 cm x 4.2 cm = volume: 105 mL. Diffusely increased echogenicity of the renal parenchyma is noted. No mass or hydronephrosis visualized. Bladder: Appears  normal for degree of bladder distention. Other: None. IMPRESSION: Bilateral echogenic kidneys which may be secondary to medical renal disease. Electronically Signed   By: Aram Candela M.D.   On: 11/08/2022 18:44   CT ABDOMEN PELVIS WO CONTRAST  Result Date: 11/08/2022 CLINICAL DATA:  Epigastric pain EXAM: CT ABDOMEN AND PELVIS WITHOUT CONTRAST TECHNIQUE: Multidetector CT imaging of the abdomen and pelvis was performed following the standard protocol without IV contrast. RADIATION DOSE REDUCTION: This exam was performed according to the departmental dose-optimization program which includes automated exposure control, adjustment of the mA and/or kV according to patient size and/or use of iterative reconstruction technique. COMPARISON:  None Available. FINDINGS: Lower chest: Bilateral small pleural effusions. Hepatobiliary: No focal hepatic lesion. Normal gallbladder. No biliary duct dilatation. Common bile duct is normal. Pancreas: Pancreas is normal. No ductal dilatation. No pancreatic inflammation. Spleen: Normal spleen Adrenals/urinary tract: Adrenal glands normal. Nonobstructing calculi in the RIGHT kidney. Ureters and bladder normal. Stomach/Bowel: Stomach, small bowel, appendix, and cecum are normal. The colon and rectosigmoid colon are normal. Vascular/Lymphatic: Abdominal aorta is normal caliber with atherosclerotic calcification. There is no retroperitoneal or periportal lymphadenopathy. No pelvic lymphadenopathy. Reproductive: Prostate unremarkable Other: No free fluid. Musculoskeletal: Multiple subtle lucencies within the iliac bones including 12 mm lesion within the LEFT iliac bone on image 60/3. Small lesion on image 58 the same bone. Similar lesions in the RIGHT iliac bone on image 60/3. Mild permeative pattern in the superior border of the iliac wings. IMPRESSION: Subtle lucent lesions in the pelvic bones. Recommend correlation with myeloma serology. No acute findings abdomen pelvis. Bilateral  pleural effusions. Electronically Signed   By: Genevive Bi M.D.   On: 11/08/2022 13:42   DG Chest Port 1 View  Result Date: 11/08/2022 CLINICAL DATA:  Dyspnea, anemia EXAM: PORTABLE CHEST 1 VIEW COMPARISON:  11/16/2007 chest radiograph. FINDINGS: Stable cardiomediastinal silhouette with normal heart size. No pneumothorax. No pleural effusion. Mild diffuse prominence of the parahilar interstitial markings. IMPRESSION: Mild diffuse prominence of the parahilar interstitial markings, differential includes mild pulmonary edema versus atypical/viral infection. Electronically Signed   By: Delbert Phenix M.D.   On: 11/08/2022 13:28     ASSESSMENT/PLAN:  This is a very pleasant 57 year old Caucasian male diagnosed with multiple myeloma with 53% plasma cells on bone marrow biopsy and aspirate.  The patient is here to establish care on an outpatient basis.  He was recently hospitalized in August 2020 for which showed his diagnosis of multiple myeloma.  While admitted to the hospital he underwent day 1 cycle 1.   He rstarted treatment with subcutaneous Velcade 1.3 Mg/KG on days 1, 4, 8 and 11 every 3 weeks, cyclophosphamide 500 Mg/M2 IV on days 1 and 8 as well as Decadron 40 mg p.o. on weekly basis start with the first dose of Velcade on 11/14/22.   He is here to establish care today.   Per Dr. Arbutus Ped, once his renal function improves, he will treat him with the standard treatment with daratumumab, Velcade, Revlimid  and Decadron for 4 cycles followed by evaluation for autologous stem cell transplant.   The patient was seen with Dr. Arbutus Ped today. Labs were reviewed. His creatinine is  7.30 today. He is ok to proceed with treatment today as scheduled.  I will arrange for a PET scan outpatient basis for staging.  We will transfuse the patient if his hemoglobin is less than 8. I will arrange for him to receive weekly sample to blood bank holds.  We will arrange for chemo education class.  I sent a  prescription for Compazine and Decadron.  He is scheduled for plasmapheresis tomorrow, Thursday, and Saturday.  I gave him the number to nephrology office because he states he does not have an appointment yet. Placed a referral to social work as the patient is seen to discuss disability and Medicaid.  Will see him back for follow-up in 1 week before day 11 cycle 1   The patient was advised to call immediately if she has any concerning symptoms in the interval. The patient voices understanding of current disease status and treatment options and is in agreement with the current care plan. All questions were answered. The patient knows to call the clinic with any problems, questions or concerns. We can certainly see the patient much sooner if necessary  Orders Placed This Encounter  Procedures   NM PET Image Initial (PI) Whole Body    Standing Status:   Future    Standing Expiration Date:   11/17/2023    Order Specific Question:   If indicated for the ordered procedure, I authorize the administration of a radiopharmaceutical per Radiology protocol    Answer:   Yes    Order Specific Question:   Preferred imaging location?    Answer:   [facility]      Drew Bovenzi L Fortino Haag, PA-C 11/17/22  ADDENDUM: Hematology/Oncology Attending: I had a face-to-face encounter with the patient today.  I reviewed his record, lab, biopsy results and recommended his care plan.  This is a very pleasant 57 years old white male recently diagnosed with multiple myeloma presented with lytic lesion in addition to renal insufficiency and bone marrow biopsy and aspirate showed plasma cell neoplasm with 53% plasma cells diagnosed in August 2024.  The patient was seen during his hospitalization and he underwent plasmapheresis twice. I also started the patient on first cycle of his systemic chemotherapy with Velcade 1.3 Mg/KG on days 1, 4, 8 and 11 and cyclophosphamide 500 Mg/M2 on days 1 and 8 and Decadron 40 mg on  weekly basis.  He received the first dose of his treatment during his hospitalization.  He is scheduled today for day 4 of cycle #1 with subcutaneous Velcade. I recommended for the patient to proceed with his treatment as planned until improvement of his renal insufficiency then will switch his treatment to daratumumab, Velcade, Revlimid and Decadron. I will arrange for the patient to have a whole-body PET scan for evaluation of his myeloma. He will come back for follow-up visit in 1 week for reevaluation and close monitoring of his condition. He was advised to call immediately if he has any other concerning symptoms in the interval. The total time spent in the appointment was 30 minutes. Disclaimer: This note was dictated with voice recognition software. Similar sounding words can inadvertently be transcribed and may be missed upon review. Lajuana Matte, MD

## 2022-11-15 NOTE — Progress Notes (Addendum)
[facility] Progress Note   Home meds include - meloxicam, tylenol, lidocaine patch    BP's initial 171/102 --> now 127/ 76   HR 88  RR 20     CXR - IMPRESSION: Mild diffuse prominence of the parahilar interstitial markings, differential includes mild pulmonary edema versus atypical/viral infection.    CT abd noncontrast -->  IMPRESSION: Multiple subtle lucencies within the iliac bones including 12 mm lesion within the LEFT iliac bone on image 60/3. Small lesion on image 58 the same bone. Similar lesions in the RIGHT iliac bone on image 60/3. Mild permeative pattern in the superior border of the iliac wings.  O/w no acute findings in abd / pelvis.      Dec 2023 --> creat 2.21 in epic     Renal US - 10.6/ 11.3 cm, no hydronephrosis     UP/C ratio - 5.6 gm     UA 8/10 - mod Hb, prot 30, rare bact, 0-5 rbc/wbc/epi     UNa 44,  UCr 35   Assessment/ Plan: Renal failure - in patient w/o much in the way of chronic medical conditions, not taking any medications. Not sure how acute this is since labs from dec 2023 showed elevation at 2.2.  Creat here was 9.0 on admission, w/ associate vol overload, and mild uremic symptoms (nausea). CT showed no obstruction. UA is unremarkable. UP/C ratio is high at 5.6 which doesn't correspond with the 30 protein in the dipstick UA - will get 24 hrs urine for protein. Urine lytes are c/w renal failure. Anemia here is severe and there are some lucent lesions in the pelvic bones which could be due to multiple myeloma. Total prot is not high, but corrected Ca is a bit high. He has been taking nsaids for 4 wks but unlikely that this would do this much damage in that short of time and not c/w membranous nephropathy. GN less likely with UA results. - ASO, ANCA, C3C4, GBM all neg. Myeloma (lambda 28K) by BMBx 8/13 (53% plasma cells) and appreciate oncology Dr. Arbutus Ped seeing the pt.   Will plan on TPE for 3-5  treatments to decrease burden of lambda light chains  in the renal tubules given the advanced renal failure. My concern is that the Cr was already in the 5's in 08/26/2022 and possibility it may be too late for the kidneys. Fortunately he is not uremic; I educated him about avoiding foods high in K (took away the OJ this am; he is very compliant). Renal diet.  Certainly do not want TPE to get in the way of chemotherapy.  TPE #2 Saturday and then can plan around when pt is discharged; we can certainly do outpt TPE. Cr leveling in 7's and I hope that is that his new baseline but we shall see.  D/w primary team and will plan TPE #3-5 next week as outpt on TTSa + I've also contacted [facility] and they will reach out to the pt for a 1-2 week f/u. Still has RIJ TC until his renal function stabilizes. Fortunately he is not uremic.   Vol overload - early IS edema on CXR and LE edema. Off IV lasix, looks euvolemic today on exam.  BP - stable BP's here Anemia, severe - Hb 4.8 on admission. SP 2u prbc's 8/10. Hb up to 7.7 Hyperphosphatemia - started binders auryxia 2 tabs TIDM and will need to recheck phos on Sat.  Subjective: Cr 7.2  today and denies f/c/n/v/sob. Good appetite  Vitals:   11/14/22 2135 11/15/22 0342 11/15/22 0700 11/15/22 0735  BP: (!) 161/95 130/78  119/82  Pulse: 78 72  79  Resp: 18 18  16   Temp: 98.4 F (36.9 C) 97.9 F (36.6 C)  97.9 F (36.6 C)  TempSrc: Oral   Oral  SpO2: 98% 97%  100%  Weight:   80.9 kg   Height:        Exam: Gen alert, no distress No jvd or bruits Chest clear bilat to bases RRR no MRG Abd soft ntnd no mass or ascites +bs Ext trace pretib edema Neuro is alert, Ox 3 , nf, no asterixis Access: RIJ TC       Recent Labs  Lab 11/14/22 0010 11/15/22 0036  HGB 8.4* 8.2*  ALBUMIN 3.6 3.4*  CALCIUM 10.0 10.4*  PHOS 6.8* 5.0*  CREATININE 7.15* 7.19*  K 4.5 5.0   No results for input(s): "IRON", "TIBC", "FERRITIN" in the last 168 hours. Inpatient medications:  acyclovir  200 mg Oral Daily   calcium  carbonate  2 tablet Oral Q3H   Chlorhexidine Gluconate Cloth  6 each Topical Daily   enoxaparin (LOVENOX) injection  30 mg Subcutaneous Q24H   ferric citrate  420 mg Oral TID WC   heparin sodium (porcine)  1,000 Units Intracatheter Once   lidocaine  1 patch Transdermal Q24H   sodium bicarbonate  650 mg Oral BID    albumin human 25 % 50 g in sodium chloride 0.9 %     calcium gluconate     calcium gluconate     citrate dextrose     acetaminophen, acetaminophen, alum & mag hydroxide-simeth, calcium gluconate, diphenhydrAMINE, pneumococcal 20-valent conjugate vaccine

## 2022-11-16 ENCOUNTER — Encounter (HOSPITAL_COMMUNITY): Payer: Self-pay | Admitting: Emergency Medicine

## 2022-11-16 ENCOUNTER — Other Ambulatory Visit: Payer: Self-pay

## 2022-11-16 ENCOUNTER — Emergency Department (HOSPITAL_COMMUNITY)
Admission: EM | Admit: 2022-11-16 | Discharge: 2022-11-18 | Disposition: A | Discharge status: Home / Self Care | Payer: BC Managed Care – PPO | Attending: Emergency Medicine | Admitting: Emergency Medicine

## 2022-11-16 DIAGNOSIS — T82838A Hemorrhage of vascular prosthetic devices, implants and grafts, initial encounter: Secondary | ICD-10-CM | POA: Diagnosis present

## 2022-11-16 DIAGNOSIS — Z8579 Personal history of other malignant neoplasms of lymphoid, hematopoietic and related tissues: Secondary | ICD-10-CM | POA: Insufficient documentation

## 2022-11-16 DIAGNOSIS — N189 Chronic kidney disease, unspecified: Secondary | ICD-10-CM | POA: Insufficient documentation

## 2022-11-16 DIAGNOSIS — R58 Hemorrhage, not elsewhere classified: Secondary | ICD-10-CM

## 2022-11-16 DIAGNOSIS — Y712 Prosthetic and other implants, materials and accessory cardiovascular devices associated with adverse incidents: Secondary | ICD-10-CM | POA: Diagnosis not present

## 2022-11-16 HISTORY — DX: Malignant (primary) neoplasm, unspecified: C80.1

## 2022-11-16 NOTE — Progress Notes (Signed)
Consult to assess central line. Bleeding noted. Pt had applied gauze below dressing to catch blood. Dressing change done, otherwise unremarkable. Instructed pt to do the same if line bleeds outside of dressing. Leave dressing intact. OK to keep same appointment for chemo/ pheresis as long as dressing remains intact. He voiced understanding. Pt taken back to lobby to be seen by provider.

## 2022-11-16 NOTE — Discharge Instructions (Signed)
Keep your scheduled appointments for treatments.  Return to the emergency department for any further concerns.

## 2022-11-16 NOTE — ED Notes (Addendum)
Pt called 3x for room placement with no response, not seen in the waiting room

## 2022-11-16 NOTE — ED Triage Notes (Signed)
Pt. Stated, I was just Dx with cancer and having plasma exchanges and my catheter is bleeding at the site.

## 2022-11-16 NOTE — ED Provider Notes (Signed)
[place] EMERGENCY DEPARTMENT AT [facility] Provider Note   CSN: [id number] Arrival date & time: 11/16/22  1005     History  Chief Complaint  Patient presents with   Vascular Access Problem    Drew Cooper is a 57 y.o. male.  HPI Patient presents for bleeding from tunneled catheter site.  Medical history includes CKD, anemia, multiple myeloma, arthritis.  He was admitted to the hospital 8 days ago for fatigue and dyspnea.  At the time, he was found to have severe anemia and renal failure.  He was diagnosed with multiple myeloma.  He had a right IJ tunneled catheter placed 3 days ago.  He underwent 2 sessions of plasmapheresis.  Plan was for continuing this in the outpatient basis.  He received 3 units of PRBCs while hospitalized.  He was discharged yesterday.  Last night, he had a small amount of bleeding around his recently placed catheter.  Bleeding has since stopped.  He presents today simply for cleaning of the site. Patient has no other concerns at this time. Tomorrow, he has a chemotherapy session scheduled.  The following day, he will undergo another session of plasmapheresis.      Home Medications Prior to Admission medications   Medication Sig Start Date End Date Taking? Authorizing Provider  acetaminophen (TYLENOL) 500 MG tablet Take 500 mg by mouth every 6 (six) hours as needed for moderate pain.    [provider]  acyclovir (ZOVIRAX) 200 MG capsule Take 1 capsule (200 mg total) by mouth daily. 11/16/22   Lockie Mola, MD  ferric citrate (AURYXIA) 1 GM 210 MG(Fe) tablet Take 2 tablets (420 mg total) by mouth 3 (three) times daily with meals. 11/15/22   Lockie Mola, MD  lidocaine (LIDODERM) 5 % Place 1 patch onto the skin daily. 05/24/22   [provider]  sodium bicarbonate 650 MG tablet Take 1 tablet (650 mg total) by mouth 2 (two) times daily. 11/15/22   Lockie Mola, MD      Allergies    Patient has no known allergies.     Review of Systems   Review of Systems  Cardiovascular:        Resolved bleeding around tunneled catheter site.  All other systems reviewed and are negative.   Physical Exam Updated Vital Signs BP (!) 152/87   Pulse 90   Temp 98 F (36.7 C) (Oral)   Resp 16   SpO2 100%  Physical Exam Vitals and nursing note reviewed.  Constitutional:      General: He is not in acute distress.    Appearance: Normal appearance. He is well-developed. He is not ill-appearing, toxic-appearing or diaphoretic.  HENT:     Head: Normocephalic and atraumatic.     Right Ear: External ear normal.     Left Ear: External ear normal.     Nose: Nose normal.     Mouth/Throat:     Mouth: Mucous membranes are moist.  Eyes:     Extraocular Movements: Extraocular movements intact.     Conjunctiva/sclera: Conjunctivae normal.  Cardiovascular:     Rate and Rhythm: Normal rate and regular rhythm.     Comments: Tunneled catheter placed in right upper chest.  Area has been cleaned with new dressing applied.  No active bleeding. Pulmonary:     Effort: Pulmonary effort is normal. No respiratory distress.  Abdominal:     General: There is no distension.     Palpations: Abdomen is soft.  Musculoskeletal:  General: No swelling. Normal range of motion.     Cervical back: Normal range of motion and neck supple.  Skin:    General: Skin is warm and dry.     Capillary Refill: Capillary refill takes less than 2 seconds.  Neurological:     General: No focal deficit present.     Mental Status: He is alert and oriented to person, place, and time.  Psychiatric:        Mood and Affect: Mood normal.        Behavior: Behavior normal.     ED Results / Procedures / Treatments   Labs (all labs ordered are listed, but only abnormal results are displayed) Labs Reviewed - No data to display  EKG None  Radiology No results found.  Procedures Procedures    Medications Ordered in ED Medications - No data to  display  ED Course/ Medical Decision Making/ A&P                                 Medical Decision Making  Patient presents for transient episode of bleeding around his newly placed right chest catheter.  He was discharged from the hospital yesterday.  Bleeding occurred last night.  He presented to the emergency department today simply to get the site cleaned.  On my assessment, site has already been cleaned and new dressing applied.  There is no evidence of active bleeding.  Patient has no other concerns.  He does have close follow-up with chemotherapy scheduled for tomorrow and plasmapheresis scheduled for the following day.  He does request discharge home.        Final Clinical Impression(s) / ED Diagnoses Final diagnoses:  Bleeding    Rx / DC Orders ED Discharge Orders     None         Gloris Manchester, MD 11/16/22 1200

## 2022-11-17 ENCOUNTER — Inpatient Hospital Stay (HOSPITAL_BASED_OUTPATIENT_CLINIC_OR_DEPARTMENT_OTHER): Payer: BC Managed Care – PPO | Admitting: Physician Assistant

## 2022-11-17 ENCOUNTER — Inpatient Hospital Stay: Payer: BC Managed Care – PPO

## 2022-11-17 ENCOUNTER — Inpatient Hospital Stay: Payer: BC Managed Care – PPO | Attending: Internal Medicine

## 2022-11-17 ENCOUNTER — Inpatient Hospital Stay: Payer: BC Managed Care – PPO | Admitting: Licensed Clinical Social Worker

## 2022-11-17 VITALS — BP 148/84 | HR 84 | Temp 98.0°F | Resp 18 | Wt 183.0 lb

## 2022-11-17 DIAGNOSIS — D649 Anemia, unspecified: Secondary | ICD-10-CM | POA: Diagnosis not present

## 2022-11-17 DIAGNOSIS — Z7189 Other specified counseling: Secondary | ICD-10-CM | POA: Diagnosis not present

## 2022-11-17 DIAGNOSIS — C9000 Multiple myeloma not having achieved remission: Secondary | ICD-10-CM

## 2022-11-17 DIAGNOSIS — Z5112 Encounter for antineoplastic immunotherapy: Secondary | ICD-10-CM | POA: Diagnosis present

## 2022-11-17 DIAGNOSIS — Z5111 Encounter for antineoplastic chemotherapy: Secondary | ICD-10-CM | POA: Insufficient documentation

## 2022-11-17 LAB — CMP (CANCER CENTER ONLY)
ALT: 5 U/L (ref 0–44)
AST: 23 U/L (ref 15–41)
Albumin: 4.2 g/dL (ref 3.5–5.0)
Alkaline Phosphatase: 29 U/L — ABNORMAL LOW (ref 38–126)
Anion gap: 10 (ref 5–15)
BUN: 72 mg/dL — ABNORMAL HIGH (ref 6–20)
CO2: 20 mmol/L — ABNORMAL LOW (ref 22–32)
Calcium: 8.5 mg/dL — ABNORMAL LOW (ref 8.9–10.3)
Chloride: 108 mmol/L (ref 98–111)
Creatinine: 7.3 mg/dL (ref 0.61–1.24)
GFR, Estimated: 8 mL/min — ABNORMAL LOW (ref >60)
Glucose, Bld: 96 mg/dL (ref 70–99)
Potassium: 4.8 mmol/L (ref 3.5–5.1)
Sodium: 138 mmol/L (ref 135–145)
Total Bilirubin: 0.4 mg/dL (ref 0.3–1.2)
Total Protein: 6.7 g/dL (ref 6.5–8.1)

## 2022-11-17 LAB — KAPPA/LAMBDA LIGHT CHAINS
Kappa free light chain: 18.1 mg/L (ref 3.3–19.4)
Kappa, lambda light chain ratio: 0 — ABNORMAL LOW (ref 0.26–1.65)

## 2022-11-17 LAB — CBC WITH DIFFERENTIAL (CANCER CENTER ONLY)
Abs Immature Granulocytes: 0.05 10*3/uL (ref 0.00–0.07)
Basophils Absolute: 0.1 10*3/uL (ref 0.0–0.1)
Basophils Relative: 2 %
Eosinophils Absolute: 0.2 10*3/uL (ref 0.0–0.5)
Eosinophils Relative: 3 %
HCT: 26.6 % — ABNORMAL LOW (ref 39.0–52.0)
Hemoglobin: 8.3 g/dL — ABNORMAL LOW (ref 13.0–17.0)
Immature Granulocytes: 1 %
Lymphocytes Relative: 17 %
Lymphs Abs: 1.2 10*3/uL (ref 0.7–4.0)
MCH: 30.6 pg (ref 26.0–34.0)
MCHC: 31.2 g/dL (ref 30.0–36.0)
MCV: 98.2 fL (ref 80.0–100.0)
Monocytes Absolute: 0.9 10*3/uL (ref 0.1–1.0)
Monocytes Relative: 14 %
Neutro Abs: 4.2 10*3/uL (ref 1.7–7.7)
Neutrophils Relative %: 63 %
Platelet Count: 198 10*3/uL (ref 150–400)
RBC: 2.71 MIL/uL — ABNORMAL LOW (ref 4.22–5.81)
RDW: 19.6 % — ABNORMAL HIGH (ref 11.5–15.5)
WBC Count: 6.7 10*3/uL (ref 4.0–10.5)
nRBC: 0 % (ref 0.0–0.2)

## 2022-11-17 LAB — SAMPLE TO BLOOD BANK

## 2022-11-17 MED ORDER — PROCHLORPERAZINE MALEATE 10 MG PO TABS
10.0000 mg | ORAL_TABLET | Freq: Four times a day (QID) | ORAL | 2 refills | Status: AC | PRN
Start: 2022-11-17 — End: ?

## 2022-11-17 MED ORDER — DEXAMETHASONE 4 MG PO TABS
ORAL_TABLET | ORAL | 2 refills | Status: DC
Start: 2022-11-17 — End: 2022-11-27

## 2022-11-17 MED ORDER — PROCHLORPERAZINE MALEATE 10 MG PO TABS
10.0000 mg | ORAL_TABLET | Freq: Once | ORAL | Status: AC
  Administered 2022-11-17: 10 mg via ORAL
  Filled 2022-11-17: qty 1

## 2022-11-17 MED ORDER — BORTEZOMIB CHEMO SQ INJECTION 3.5 MG (2.5MG/ML)
1.3000 mg/m2 | Freq: Once | INTRAMUSCULAR | Status: AC
  Administered 2022-11-17: 2.5 mg via SUBCUTANEOUS
  Filled 2022-11-17: qty 1

## 2022-11-17 NOTE — Patient Instructions (Signed)
[facility]  Discharge Instructions: Thank you for choosing [facility] to provide your oncology and hematology care.   If you have a lab appointment with the Cancer Center, please go directly to the Cancer Center and check in at the registration area.   Wear comfortable clothing and clothing appropriate for easy access to any Portacath or PICC line.   We strive to give you quality time with your provider. You may need to reschedule your appointment if you arrive late (15 or more minutes).  Arriving late affects you and other patients whose appointments are after yours.  Also, if you miss three or more appointments without notifying the office, you may be dismissed from the clinic at the provider's discretion.      For prescription refill requests, have your pharmacy contact our office and allow 72 hours for refills to be completed.    Today you received the following chemotherapy and/or immunotherapy agents: Velcade        To help prevent nausea and vomiting after your treatment, we encourage you to take your nausea medication as directed.  BELOW ARE SYMPTOMS THAT SHOULD BE REPORTED IMMEDIATELY: *FEVER GREATER THAN 100.4 F (38 C) OR HIGHER *CHILLS OR SWEATING *NAUSEA AND VOMITING THAT IS NOT CONTROLLED WITH YOUR NAUSEA MEDICATION *UNUSUAL SHORTNESS OF BREATH *UNUSUAL BRUISING OR BLEEDING *URINARY PROBLEMS (pain or burning when urinating, or frequent urination) *BOWEL PROBLEMS (unusual diarrhea, constipation, pain near the anus) TENDERNESS IN MOUTH AND THROAT WITH OR WITHOUT PRESENCE OF ULCERS (sore throat, sores in mouth, or a toothache) UNUSUAL RASH, SWELLING OR PAIN  UNUSUAL VAGINAL DISCHARGE OR ITCHING   Items with * indicate a potential emergency and should be followed up as soon as possible or go to the Emergency Department if any problems should occur.  Please show the CHEMOTHERAPY ALERT CARD or IMMUNOTHERAPY ALERT CARD at  check-in to the Emergency Department and triage nurse.  Should you have questions after your visit or need to cancel or reschedule your appointment, please contact [facility]  Dept: [phone]  and follow the prompts.  Office hours are 8:00 a.m. to 4:30 p.m. Monday - Friday. Please note that voicemails left after 4:00 p.m. may not be returned until the following business day.  We are closed weekends and major holidays. You have access to a nurse at all times for urgent questions. Please call the main number to the clinic Dept: [phone] and follow the prompts.   For any non-urgent questions, you may also contact your provider using MyChart. We now offer e-Visits for anyone 18 and older to request care online for non-urgent symptoms. For details visit [email or web address].   Also download the MyChart app! Go to the app store, search "MyChart", open the app, select [facility], and log in with your MyChart username and password.  

## 2022-11-17 NOTE — Progress Notes (Signed)
[facility] Clinical Social Work  Initial Assessment   Drew Cooper is a 57 y.o. year old male accompanied by patient and wife. Clinical Social Work was referred by medical provider for assessment of psychosocial needs.   SDOH (Social Determinants of Health) assessments performed: Yes   SDOH Screenings   Food Insecurity: No Food Insecurity (11/12/2022)  Housing: Low Risk  (11/12/2022)  Transportation Needs: No Transportation Needs (11/12/2022)  Utilities: Not At Risk (11/12/2022)  Tobacco Use: Medium Risk (11/10/2022)     Distress Screen completed: No     No data to display            Family/Social Information:  Housing Arrangement: patient lives with his wife, their daughter, and grandson.   Family members/support persons in your life? Pt has support at home, but does not have family who would be able to offer financial support.   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  Unfortunately, pt lost his job shortly before he was admitted to the hospital.  Pt's insurance from work is still in place, but they have not received paperwork to apply for [employer] and would not be able to afford this option.  Pt's wife is retired and receives Tree surgeon retirement.   Income source: Supported by [employer] and Friends Financial concerns: Yes, current concerns Type of concern: Utilities, Government social research officer, and Medical bills Food access concerns: no Religious or spiritual practice: Not known Services Currently in place:  none  Coping/ Adjustment to diagnosis: Patient understands treatment plan and what happens next? yes Concerns about diagnosis and/or treatment: Losing my job and/or losing income, Overwhelmed by information, Afraid of cancer, and Quality of life Patient reported stressors: Insurance, Actuary, Anxiety/ nervousness, and Adjusting to my illness Hopes and/or priorities: Pt's priority is to continue w/ treatment w/  the hope of positive results. Patient enjoys time with family/ friends Current coping skills/ strengths: Motivation for treatment/growth , Physical Health , and Supportive family/friends     SUMMARY: Current SDOH Barriers:  Financial constraints related to loss of employment.  Clinical Social Work Clinical Goal(s):  Explore community resource options for unmet needs related to:  Financial Strain   Interventions: Discussed common feeling and emotions when being diagnosed with cancer, and the importance of support during treatment Informed patient of the support team roles and support services at [facility] Provided CSW contact information and encouraged patient to call with any questions or concerns Referred patient to the [employer] to assist w/ applying for Medicaid and social security disability.  Given contact information for financial resource specialist to apply for the [employer].  Discussed the option of trying to pay for Cob[employer] versus applying for Medicaid.     Follow Up Plan: Patient will contact CSW with any support or resource needs Patient verbalizes understanding of plan: Yes    Rachel Moulds, LCSW Clinical Social Worker [facility]

## 2022-11-17 NOTE — Progress Notes (Signed)
CRITICAL VALUE STICKER  CRITICAL VALUE: Creatinine 7.30  RECEIVER (on-site recipient of call): Tilford Pillar, RN  DATE & TIME NOTIFIED: 11/17/22 at [phone]  MESSENGER (representative from lab): Terrilyn Saver  MD NOTIFIED: [employer] Heilingoetter, PA  TIME OF NOTIFICATION: [phone] 11/17/22  RESPONSE:  Will see in office and aware.

## 2022-11-17 NOTE — Progress Notes (Signed)
Per Cassie PA, ok to treat today with SCR 7.3

## 2022-11-18 ENCOUNTER — Encounter (HOSPITAL_COMMUNITY): Payer: Self-pay | Admitting: Internal Medicine

## 2022-11-18 ENCOUNTER — Ambulatory Visit: Payer: BC Managed Care – PPO | Admitting: Physician Assistant

## 2022-11-18 ENCOUNTER — Other Ambulatory Visit: Payer: BC Managed Care – PPO

## 2022-11-18 ENCOUNTER — Non-Acute Institutional Stay (HOSPITAL_COMMUNITY)
Admission: RE | Admit: 2022-11-18 | Discharge: 2022-11-18 | Disposition: A | Discharge status: Home / Self Care | Payer: BC Managed Care – PPO | Source: Ambulatory Visit | Attending: Nephrology | Admitting: Nephrology

## 2022-11-18 DIAGNOSIS — C9000 Multiple myeloma not having achieved remission: Secondary | ICD-10-CM | POA: Insufficient documentation

## 2022-11-18 LAB — BASIC METABOLIC PANEL
Anion gap: 11 (ref 5–15)
BUN: 77 mg/dL — ABNORMAL HIGH (ref 6–20)
CO2: 18 mmol/L — ABNORMAL LOW (ref 22–32)
Calcium: 8.5 mg/dL — ABNORMAL LOW (ref 8.9–10.3)
Chloride: 107 mmol/L (ref 98–111)
Creatinine, Ser: 6.88 mg/dL — ABNORMAL HIGH (ref 0.61–1.24)
GFR, Estimated: 9 mL/min — ABNORMAL LOW (ref >60)
Glucose, Bld: 86 mg/dL (ref 70–99)
Potassium: 4.7 mmol/L (ref 3.5–5.1)
Sodium: 136 mmol/L (ref 135–145)

## 2022-11-18 MED ORDER — DIPHENHYDRAMINE HCL 25 MG PO CAPS
25.0000 mg | ORAL_CAPSULE | Freq: Four times a day (QID) | ORAL | Status: DC | PRN
  Filled 2022-11-18: qty 1

## 2022-11-18 MED ORDER — HEPARIN SODIUM (PORCINE) 1000 UNIT/ML IJ SOLN
1000.0000 [IU] | Freq: Once | INTRAMUSCULAR | Status: AC
  Administered 2022-11-18: 3200 [IU]
  Filled 2022-11-18 (×2): qty 1

## 2022-11-18 MED ORDER — CALCIUM CARBONATE ANTACID 500 MG PO CHEW
2.0000 | CHEWABLE_TABLET | ORAL | Status: DC
  Administered 2022-11-18: 400 mg via ORAL
  Filled 2022-11-18: qty 2

## 2022-11-18 MED ORDER — CALCIUM GLUCONATE-NACL 2-0.675 GM/100ML-% IV SOLN
2.0000 g | Freq: Once | INTRAVENOUS | Status: AC
  Administered 2022-11-18: 2000 mg via INTRAVENOUS
  Filled 2022-11-18: qty 100

## 2022-11-18 MED ORDER — HEPARIN SODIUM (PORCINE) 1000 UNIT/ML IJ SOLN
INTRAMUSCULAR | Status: AC
  Filled 2022-11-18: qty 1

## 2022-11-18 MED ORDER — ACD FORMULA A 0.73-2.45-2.2 GM/100ML VI SOLN
1000.0000 mL | Status: DC
  Administered 2022-11-18: 1000 mL
  Filled 2022-11-18: qty 1000

## 2022-11-18 MED ORDER — SODIUM CHLORIDE 0.9 % IV SOLN
INTRAVENOUS | Status: AC
  Filled 2022-11-18 (×4): qty 200

## 2022-11-18 MED ORDER — ACETAMINOPHEN 325 MG PO TABS: 650.0000 mg | ORAL_TABLET | ORAL | Status: DC | PRN

## 2022-11-18 NOTE — Progress Notes (Signed)
Pt tolerated TPE well no issues. This is tx 1/3. Pt aware of next appointment Thursday 11am. Pt  ambulatory at d/c. D/c via personal vehicle.

## 2022-11-19 ENCOUNTER — Other Ambulatory Visit: Payer: Self-pay

## 2022-11-19 ENCOUNTER — Encounter: Payer: Self-pay | Admitting: Internal Medicine

## 2022-11-19 NOTE — Progress Notes (Signed)
Received voicemail from patient referred from Child psychotherapist.  Called patient to introduce myself as Dance movement psychotherapist and to offer available resources. Discussed one-time $1000 Marketing executive to assist with personal expenses while going through treatment. Advised what is needed to apply and how to submit. Advised he may complete process at next appointment on 11/21/22 when check-in. Briefly discussed gas cards and how they are obtained. Advised to contact me at earliest convenience after appointment to discuss other grant expenses in detail.  He has my card to do so and for any additional financial questions or concerns.

## 2022-11-20 ENCOUNTER — Non-Acute Institutional Stay (HOSPITAL_COMMUNITY)
Admission: AD | Admit: 2022-11-20 | Discharge: 2022-11-20 | Disposition: A | Discharge status: Home / Self Care | Payer: BC Managed Care – PPO | Source: Ambulatory Visit | Attending: Nephrology | Admitting: Nephrology

## 2022-11-20 ENCOUNTER — Encounter: Payer: Self-pay | Admitting: Internal Medicine

## 2022-11-20 DIAGNOSIS — C9000 Multiple myeloma not having achieved remission: Secondary | ICD-10-CM | POA: Insufficient documentation

## 2022-11-20 LAB — CBC WITH DIFFERENTIAL/PLATELET
Abs Immature Granulocytes: 0.02 K/uL (ref 0.00–0.07)
Basophils Absolute: 0.1 K/uL (ref 0.0–0.1)
Basophils Relative: 2 %
Eosinophils Absolute: 0.1 K/uL (ref 0.0–0.5)
Eosinophils Relative: 2 %
HCT: 25.3 % — ABNORMAL LOW (ref 39.0–52.0)
Hemoglobin: 8.2 g/dL — ABNORMAL LOW (ref 13.0–17.0)
Immature Granulocytes: 0 %
Lymphocytes Relative: 22 %
Lymphs Abs: 1 K/uL (ref 0.7–4.0)
MCH: 31.9 pg (ref 26.0–34.0)
MCHC: 32.4 g/dL (ref 30.0–36.0)
MCV: 98.4 fL (ref 80.0–100.0)
Monocytes Absolute: 0.4 K/uL (ref 0.1–1.0)
Monocytes Relative: 8 %
Neutro Abs: 3 K/uL (ref 1.7–7.7)
Neutrophils Relative %: 66 %
Platelets: 184 K/uL (ref 150–400)
RBC: 2.57 MIL/uL — ABNORMAL LOW (ref 4.22–5.81)
RDW: 18.5 % — ABNORMAL HIGH (ref 11.5–15.5)
WBC: 4.6 K/uL (ref 4.0–10.5)
nRBC: 0 % (ref 0.0–0.2)

## 2022-11-20 LAB — BASIC METABOLIC PANEL
Anion gap: 11 (ref 5–15)
BUN: 76 mg/dL — ABNORMAL HIGH (ref 6–20)
CO2: 16 mmol/L — ABNORMAL LOW (ref 22–32)
Calcium: 8.2 mg/dL — ABNORMAL LOW (ref 8.9–10.3)
Chloride: 108 mmol/L (ref 98–111)
Creatinine, Ser: 6.78 mg/dL — ABNORMAL HIGH (ref 0.61–1.24)
GFR, Estimated: 9 mL/min — ABNORMAL LOW (ref >60)
Glucose, Bld: 78 mg/dL (ref 70–99)
Potassium: 5.1 mmol/L (ref 3.5–5.1)
Sodium: 135 mmol/L (ref 135–145)

## 2022-11-20 MED ORDER — SODIUM CHLORIDE 0.9 % IV SOLN
INTRAVENOUS | Status: AC
  Filled 2022-11-20 (×4): qty 200

## 2022-11-20 MED ORDER — CALCIUM GLUCONATE-NACL 2-0.675 GM/100ML-% IV SOLN
2.0000 g | Freq: Once | INTRAVENOUS | Status: AC
  Administered 2022-11-20: 2000 mg via INTRAVENOUS
  Filled 2022-11-20: qty 100

## 2022-11-20 MED ORDER — DIPHENHYDRAMINE HCL 25 MG PO CAPS
25.0000 mg | ORAL_CAPSULE | Freq: Four times a day (QID) | ORAL | Status: DC | PRN
  Administered 2022-11-20: 25 mg via ORAL
  Filled 2022-11-20: qty 1

## 2022-11-20 MED ORDER — CALCIUM CARBONATE ANTACID 500 MG PO CHEW
2.0000 | CHEWABLE_TABLET | ORAL | Status: DC
  Administered 2022-11-20: 400 mg via ORAL
  Filled 2022-11-20: qty 2

## 2022-11-20 MED ORDER — HEPARIN SODIUM (PORCINE) 1000 UNIT/ML IJ SOLN
1000.0000 [IU] | Freq: Once | INTRAMUSCULAR | Status: AC
  Administered 2022-11-20: 3200 [IU]

## 2022-11-20 MED ORDER — HEPARIN SODIUM (PORCINE) 1000 UNIT/ML IJ SOLN
INTRAMUSCULAR | Status: AC
  Filled 2022-11-20: qty 4

## 2022-11-20 MED ORDER — ACETAMINOPHEN 325 MG PO TABS
650.0000 mg | ORAL_TABLET | ORAL | Status: DC | PRN
  Administered 2022-11-20: 650 mg via ORAL
  Filled 2022-11-20: qty 2

## 2022-11-20 MED ORDER — ACD FORMULA A 0.73-2.45-2.2 GM/100ML VI SOLN
1000.0000 mL | Status: DC
  Administered 2022-11-20: 1000 mL
  Filled 2022-11-20: qty 1000

## 2022-11-20 NOTE — Progress Notes (Addendum)
   11/20/22 1344  Vitals  Temp 97.9 F (36.6 C)  Temp Source Oral  Pulse Rate 71  ECG Heart Rate 71  Resp 13  BP (!) 154/85  Oxygen Therapy  SpO2 100 %  O2 Device Room Air  Patient Activity (if Appropriate) In chair  Pulse Oximetry Type Continuous  Pain Assessment  Pain Score 0  Post-apheresis  Net Removed (mL) 4535 mL  Replacement (mL) 3753 mL  ACDA infused (mL) 637 mL  Total Normal Saline Administered 158 mL  Total Calcium Administered 100 mL  Tolerated Procedure Yes  Post-Procedure Comments tx completed no issues blood rinsed back.  Hemodialysis Catheter Right Internal jugular Double lumen Permanent (Tunneled)  Placement Date/Time: 11/13/22 0859   Placed prior to admission: No  Serial / Lot #: [id number]  Expiration Date: 03/31/27  Time Out: Correct patient;Correct site;Correct procedure  Maximum sterile barrier precautions: Hand hygiene;Cap;Mask;Sterile gown...  Site Condition No complications  Blue Lumen Status Flushed;Heparin locked  Red Lumen Status Flushed;Heparin locked  Purple Lumen Status N/A  Catheter fill solution Heparin 1000 units/ml  Catheter fill volume (Arterial) 1.6 cc  Catheter fill volume (Venous) 1.6  Dressing Type Transparent  Dressing Status Antimicrobial disc in place;Clean, Dry, Intact  Drainage Description None  Dressing Change Due 11/23/22  Post treatment catheter status Capped and Clamped   Pt completed tx #4 with no issues. Pt ambulated to d/c via personal vehicle.

## 2022-11-20 NOTE — Progress Notes (Signed)
Received patient's documents for [employer] via email.  Patient will complete paperwork on Fri 11/21/22.  He has my card for any additional financial questions or concerns.

## 2022-11-21 ENCOUNTER — Other Ambulatory Visit: Payer: Self-pay | Admitting: Physician Assistant

## 2022-11-21 ENCOUNTER — Encounter: Payer: Self-pay | Admitting: Internal Medicine

## 2022-11-21 ENCOUNTER — Ambulatory Visit (INDEPENDENT_AMBULATORY_CARE_PROVIDER_SITE_OTHER): Payer: BC Managed Care – PPO | Admitting: Student

## 2022-11-21 ENCOUNTER — Inpatient Hospital Stay: Payer: BC Managed Care – PPO

## 2022-11-21 ENCOUNTER — Encounter: Payer: Self-pay | Admitting: Student

## 2022-11-21 VITALS — HR 80 | Temp 98.0°F | Resp 17

## 2022-11-21 VITALS — BP 118/64 | HR 81 | Wt 176.0 lb

## 2022-11-21 DIAGNOSIS — C9000 Multiple myeloma not having achieved remission: Secondary | ICD-10-CM | POA: Diagnosis not present

## 2022-11-21 DIAGNOSIS — N185 Chronic kidney disease, stage 5: Secondary | ICD-10-CM | POA: Diagnosis not present

## 2022-11-21 DIAGNOSIS — D631 Anemia in chronic kidney disease: Secondary | ICD-10-CM

## 2022-11-21 DIAGNOSIS — Z5112 Encounter for antineoplastic immunotherapy: Secondary | ICD-10-CM | POA: Diagnosis not present

## 2022-11-21 LAB — CBC WITH DIFFERENTIAL (CANCER CENTER ONLY)
Abs Immature Granulocytes: 0.02 10*3/uL (ref 0.00–0.07)
Basophils Absolute: 0.1 10*3/uL (ref 0.0–0.1)
Basophils Relative: 3 %
Eosinophils Absolute: 0.1 10*3/uL (ref 0.0–0.5)
Eosinophils Relative: 2 %
HCT: 25.9 % — ABNORMAL LOW (ref 39.0–52.0)
Hemoglobin: 8.4 g/dL — ABNORMAL LOW (ref 13.0–17.0)
Immature Granulocytes: 0 %
Lymphocytes Relative: 20 %
Lymphs Abs: 1 10*3/uL (ref 0.7–4.0)
MCH: 31.3 pg (ref 26.0–34.0)
MCHC: 32.4 g/dL (ref 30.0–36.0)
MCV: 96.6 fL (ref 80.0–100.0)
Monocytes Absolute: 0.4 10*3/uL (ref 0.1–1.0)
Monocytes Relative: 9 %
Neutro Abs: 3.1 10*3/uL (ref 1.7–7.7)
Neutrophils Relative %: 66 %
Platelet Count: 182 10*3/uL (ref 150–400)
RBC: 2.68 MIL/uL — ABNORMAL LOW (ref 4.22–5.81)
RDW: 18.8 % — ABNORMAL HIGH (ref 11.5–15.5)
WBC Count: 4.8 10*3/uL (ref 4.0–10.5)
nRBC: 0 % (ref 0.0–0.2)

## 2022-11-21 LAB — CMP (CANCER CENTER ONLY)
ALT: 7 U/L (ref 0–44)
AST: 17 U/L (ref 15–41)
Albumin: 4.6 g/dL (ref 3.5–5.0)
Alkaline Phosphatase: 24 U/L — ABNORMAL LOW (ref 38–126)
Anion gap: 9 (ref 5–15)
BUN: 74 mg/dL — ABNORMAL HIGH (ref 6–20)
CO2: 18 mmol/L — ABNORMAL LOW (ref 22–32)
Calcium: 8.1 mg/dL — ABNORMAL LOW (ref 8.9–10.3)
Chloride: 112 mmol/L — ABNORMAL HIGH (ref 98–111)
Creatinine: 6.74 mg/dL — ABNORMAL HIGH (ref 0.61–1.24)
GFR, Estimated: 9 mL/min — ABNORMAL LOW (ref >60)
Glucose, Bld: 89 mg/dL (ref 70–99)
Potassium: 5.3 mmol/L — ABNORMAL HIGH (ref 3.5–5.1)
Sodium: 139 mmol/L (ref 135–145)
Total Bilirubin: 0.3 mg/dL (ref 0.3–1.2)
Total Protein: 6.4 g/dL — ABNORMAL LOW (ref 6.5–8.1)

## 2022-11-21 LAB — SAMPLE TO BLOOD BANK

## 2022-11-21 MED ORDER — SODIUM CHLORIDE 0.9% FLUSH: 10.0000 mL | INTRAVENOUS | Status: DC | PRN

## 2022-11-21 MED ORDER — SODIUM CHLORIDE 0.9 % IV SOLN
500.0000 mg/m2 | Freq: Once | INTRAVENOUS | Status: AC
  Administered 2022-11-21: 1000 mg via INTRAVENOUS
  Filled 2022-11-21: qty 50

## 2022-11-21 MED ORDER — HEPARIN SOD (PORK) LOCK FLUSH 100 UNIT/ML IV SOLN: 500.0000 [IU] | Freq: Once | INTRAVENOUS | Status: DC | PRN

## 2022-11-21 MED ORDER — PALONOSETRON HCL INJECTION 0.25 MG/5ML
0.2500 mg | Freq: Once | INTRAVENOUS | Status: AC
  Administered 2022-11-21: 0.25 mg via INTRAVENOUS
  Filled 2022-11-21: qty 5

## 2022-11-21 MED ORDER — BORTEZOMIB CHEMO SQ INJECTION 3.5 MG (2.5MG/ML)
1.3000 mg/m2 | Freq: Once | INTRAMUSCULAR | Status: AC
  Administered 2022-11-21: 2.5 mg via SUBCUTANEOUS
  Filled 2022-11-21: qty 1

## 2022-11-21 MED ORDER — SODIUM CHLORIDE 0.9 % IV SOLN: Freq: Once | INTRAVENOUS | Status: AC

## 2022-11-21 MED ORDER — SODIUM CHLORIDE 0.9 % IV SOLN
20.0000 mg | Freq: Once | INTRAVENOUS | Status: AC
  Administered 2022-11-21: 20 mg via INTRAVENOUS
  Filled 2022-11-21: qty 20

## 2022-11-21 NOTE — Assessment & Plan Note (Signed)
Oncology visit for palliative chemotherapy scheduled today. Following along.

## 2022-11-21 NOTE — Assessment & Plan Note (Signed)
Stable 8.2, following with oncology

## 2022-11-21 NOTE — Progress Notes (Signed)
  SUBJECTIVE:   CHIEF COMPLAINT / HPI:   Hospital F/U:  Admitted from 11/08/22-11/15/22? initially for fatigue ad dyspnea, was found to have Cr 9.15 and Hgb 4.8 requiring multiple prbcs, work up showed evidence of multiple myeloma (CT - subtle lucency of pelvic bones, elevated lambda and kappa free light chains, elevated beta-2 microglobulin, elevated LDH, M spike in the beta globulin on SPEP, and elevated urine protein. Bone marrow biopsy showed 53% plasma cells, confirming the diagnosis of multiple myeloma). Treatment with plasmapheresis was initiated with tunneled catheter placement in right internal jugular. Started on palliative chemotherapy (velcade and decadron) on 8/16.   Recently at ED 11/16/22 for bleeding at [facility] site, placed new catheter in ED.    Oncology appts already scheduled. TPE with nephrology.   Doing well overall, has leg pain and persistent fatigue. Has oncology appt after this visit. No difficulty with bathroom use, no chest pain.   PCP Follow-up Recommendations per Discharge Summary: Oncology follow up for palliative chemotherapy treatment scheduled Nephrology follow up for plasmapheresis- they will call patient to schedule Hospital follow up scheduled- f/u PLEX treatments, chemo  PERTINENT  PMH / PSH:   Past Medical History:  Diagnosis Date   Cancer [facility])     Patient Care Team: Alfredo Martinez, MD as PCP - General (Family Medicine) OBJECTIVE:  BP 118/64   Pulse 81   Wt 176 lb (79.8 kg)   SpO2 99%   BMI 25.25 kg/m  Physical Exam  General: NAD, pleasant, able to participate in exam Card: RRR Respiratory: No respiratory distress Skin: warm and dry, no rashes noted Psych: Normal affect and mood  ASSESSMENT/PLAN:  Anemia due to stage 5 chronic kidney disease, not on chronic dialysis (HCC) Assessment & Plan: Stable 8.2, following with oncology    Stage 5 chronic kidney disease not on chronic dialysis [facility]) Assessment & Plan: Cr downtrending on TPE as  expected. Has nephrology appt on Tuesday. RIJ TDC in place and no longer bleeding.    Multiple myeloma not having achieved remission [facility]) Assessment & Plan: Oncology visit for palliative chemotherapy scheduled today. Following along.     Return in about 2 months (around 01/21/2023) for MM. Alfredo Martinez, MD 11/21/2022, 9:29 AM PGY-3, [facility]

## 2022-11-21 NOTE — Patient Instructions (Signed)
[facility]  Discharge Instructions: Thank you for choosing [facility] to provide your oncology and hematology care.   If you have a lab appointment with the Cancer Center, please go directly to the Cancer Center and check in at the registration area.   Wear comfortable clothing and clothing appropriate for easy access to any Portacath or PICC line.   We strive to give you quality time with your provider. You may need to reschedule your appointment if you arrive late (15 or more minutes).  Arriving late affects you and other patients whose appointments are after yours.  Also, if you miss three or more appointments without notifying the office, you may be dismissed from the clinic at the provider's discretion.      For prescription refill requests, have your pharmacy contact our office and allow 72 hours for refills to be completed.    Today you received the following chemotherapy and/or immunotherapy agents: Velcade, Cytoxan.       To help prevent nausea and vomiting after your treatment, we encourage you to take your nausea medication as directed.  BELOW ARE SYMPTOMS THAT SHOULD BE REPORTED IMMEDIATELY: *FEVER GREATER THAN 100.4 F (38 C) OR HIGHER *CHILLS OR SWEATING *NAUSEA AND VOMITING THAT IS NOT CONTROLLED WITH YOUR NAUSEA MEDICATION *UNUSUAL SHORTNESS OF BREATH *UNUSUAL BRUISING OR BLEEDING *URINARY PROBLEMS (pain or burning when urinating, or frequent urination) *BOWEL PROBLEMS (unusual diarrhea, constipation, pain near the anus) TENDERNESS IN MOUTH AND THROAT WITH OR WITHOUT PRESENCE OF ULCERS (sore throat, sores in mouth, or a toothache) UNUSUAL RASH, SWELLING OR PAIN  UNUSUAL VAGINAL DISCHARGE OR ITCHING   Items with * indicate a potential emergency and should be followed up as soon as possible or go to the Emergency Department if any problems should occur.  Please show the CHEMOTHERAPY ALERT CARD or IMMUNOTHERAPY ALERT CARD  at check-in to the Emergency Department and triage nurse.  Should you have questions after your visit or need to cancel or reschedule your appointment, please contact [facility]  Dept: [phone]  and follow the prompts.  Office hours are 8:00 a.m. to 4:30 p.m. Monday - Friday. Please note that voicemails left after 4:00 p.m. may not be returned until the following business day.  We are closed weekends and major holidays. You have access to a nurse at all times for urgent questions. Please call the main number to the clinic Dept: [phone] and follow the prompts.   For any non-urgent questions, you may also contact your provider using MyChart. We now offer e-Visits for anyone 18 and older to request care online for non-urgent symptoms. For details visit [email or web address].   Also download the MyChart app! Go to the app store, search "MyChart", open the app, select [facility], and log in with your MyChart username and password.   

## 2022-11-21 NOTE — Progress Notes (Signed)
Per Cassie PA, reduce today's IV decadron to 20mg  since pt took 20mg  oral dex at home prior to treatment.   Per Cassie PA, ok to treat today with SCR 6.74

## 2022-11-21 NOTE — Assessment & Plan Note (Signed)
Cr downtrending on TPE as expected. Has nephrology appt on Tuesday. RIJ TDC in place and no longer bleeding.

## 2022-11-21 NOTE — Patient Instructions (Addendum)
It was great to see you today! Thank you for choosing [facility] for your primary care.  Today we addressed: Continue with nephrology and oncology appts  Please follow up in 2 months with [place] or earlier if needed   If you haven't already, sign up for My Chart to have easy access to your labs results, and communication with your primary care physician. I recommend that you always bring your medications to each appointment as this makes it easy to ensure you are on the correct medications and helps [place] not miss refills when you need them. Call the clinic at [phone] if your symptoms worsen or you have any concerns. Return in about 2 months (around 01/21/2023) for MM. Please arrive 15 minutes before your appointment to ensure smooth check in process.  We appreciate your efforts in making this happen.  Thank you for allowing me to participate in your care, Drew Martinez, MD 11/21/2022, 9:01 AM PGY-3, [facility]

## 2022-11-24 ENCOUNTER — Encounter: Payer: Self-pay | Admitting: Internal Medicine

## 2022-11-24 ENCOUNTER — Inpatient Hospital Stay: Payer: BC Managed Care – PPO

## 2022-11-24 ENCOUNTER — Telehealth: Payer: Self-pay | Admitting: Medical Oncology

## 2022-11-24 ENCOUNTER — Other Ambulatory Visit: Payer: Self-pay | Admitting: Physician Assistant

## 2022-11-24 VITALS — BP 135/81 | HR 73 | Temp 98.1°F | Resp 18

## 2022-11-24 DIAGNOSIS — C9000 Multiple myeloma not having achieved remission: Secondary | ICD-10-CM

## 2022-11-24 DIAGNOSIS — Z5112 Encounter for antineoplastic immunotherapy: Secondary | ICD-10-CM | POA: Diagnosis not present

## 2022-11-24 DIAGNOSIS — N185 Chronic kidney disease, stage 5: Secondary | ICD-10-CM

## 2022-11-24 LAB — CBC WITH DIFFERENTIAL (CANCER CENTER ONLY)
Abs Immature Granulocytes: 0.01 10*3/uL (ref 0.00–0.07)
Basophils Absolute: 0.1 10*3/uL (ref 0.0–0.1)
Basophils Relative: 3 %
Eosinophils Absolute: 0 10*3/uL (ref 0.0–0.5)
Eosinophils Relative: 1 %
HCT: 23.7 % — ABNORMAL LOW (ref 39.0–52.0)
Hemoglobin: 7.6 g/dL — ABNORMAL LOW (ref 13.0–17.0)
Immature Granulocytes: 0 %
Lymphocytes Relative: 27 %
Lymphs Abs: 1.1 10*3/uL (ref 0.7–4.0)
MCH: 30.9 pg (ref 26.0–34.0)
MCHC: 32.1 g/dL (ref 30.0–36.0)
MCV: 96.3 fL (ref 80.0–100.0)
Monocytes Absolute: 0.5 10*3/uL (ref 0.1–1.0)
Monocytes Relative: 11 %
Neutro Abs: 2.3 10*3/uL (ref 1.7–7.7)
Neutrophils Relative %: 58 %
Platelet Count: 189 10*3/uL (ref 150–400)
RBC: 2.46 MIL/uL — ABNORMAL LOW (ref 4.22–5.81)
RDW: 18.6 % — ABNORMAL HIGH (ref 11.5–15.5)
WBC Count: 4 10*3/uL (ref 4.0–10.5)
nRBC: 0 % (ref 0.0–0.2)

## 2022-11-24 LAB — CMP (CANCER CENTER ONLY)
ALT: 11 U/L (ref 0–44)
AST: 21 U/L (ref 15–41)
Albumin: 4.5 g/dL (ref 3.5–5.0)
Alkaline Phosphatase: 34 U/L — ABNORMAL LOW (ref 38–126)
Anion gap: 10 (ref 5–15)
BUN: 85 mg/dL — ABNORMAL HIGH (ref 6–20)
CO2: 16 mmol/L — ABNORMAL LOW (ref 22–32)
Calcium: 7.8 mg/dL — ABNORMAL LOW (ref 8.9–10.3)
Chloride: 111 mmol/L (ref 98–111)
Creatinine: 7.16 mg/dL (ref 0.61–1.24)
GFR, Estimated: 8 mL/min — ABNORMAL LOW (ref >60)
Glucose, Bld: 81 mg/dL (ref 70–99)
Potassium: 5.3 mmol/L — ABNORMAL HIGH (ref 3.5–5.1)
Sodium: 137 mmol/L (ref 135–145)
Total Bilirubin: 0.4 mg/dL (ref 0.3–1.2)
Total Protein: 6 g/dL — ABNORMAL LOW (ref 6.5–8.1)

## 2022-11-24 LAB — SAMPLE TO BLOOD BANK

## 2022-11-24 MED ORDER — BORTEZOMIB CHEMO SQ INJECTION 3.5 MG (2.5MG/ML)
1.3000 mg/m2 | Freq: Once | INTRAMUSCULAR | Status: AC
  Administered 2022-11-24: 2.5 mg via SUBCUTANEOUS
  Filled 2022-11-24: qty 1

## 2022-11-24 MED ORDER — PROCHLORPERAZINE MALEATE 10 MG PO TABS
10.0000 mg | ORAL_TABLET | Freq: Once | ORAL | Status: AC
  Administered 2022-11-24: 10 mg via ORAL
  Filled 2022-11-24: qty 1

## 2022-11-24 NOTE — Progress Notes (Unsigned)
[facility] OFFICE PROGRESS NOTE  [name], MD [address] [place] [address]  DIAGNOSIS: Recently diagnosed with multiple myeloma with lytic bone lesions in addition to renal insufficiency and bone marrow biopsy and aspirate consistent with plasma cell neoplasm with 53% plasma cells. This was diagnosed in August 2024.   PRIOR THERAPY: Plasmapheresis status post ~4 treatments    CURRENT THERAPY: First dose of treatment given while inpatient with  subcutaneous Velcade 1.3 Mg/KG on days 1, 4, 8 and 11 every 3 weeks, cyclophosphamide 500 Mg/M2 IV on days 1 and 8 as well as Decadron 40 mg p.o. on weekly basis start with the first dose of Velcade. Once his renal function improves, Dr. Arbutus Ped will likely recommend changing treatment to standard treatment with daratumumab, Velcade, Revlimid and Decadron for 4 cycles followed by evaluation for autologous stem cell transplant.   INTERVAL HISTORY: Drew Cooper 57 y.o. male returns to the clinic today for a follow-up visit accompanied by his wife. The patient establish care on an outpatient basis on 11/17/2022.  In summary the patient was hospitalized in August after presenting with severe anemia, markedly elevated creatinine, and hypercalcemia.  The patient received several blood transfusions and had pelvic lucencies on CT scan and his workup showed he has multiple myeloma and his bone marrow biopsy showed 53% plasma cells.  The patient started treatment for his multiple myeloma.  He is status post 1 cycle. He tolerated treatment well without any concerning adverse side effects.  He is scheduled for PET scan for staging on 12/04/22.   He has underwent plasmapheresis and he is followed closely by nephrology.  Overall, the patient is feeling fair today.  His fatigue is "not bad" but is not at his baseline. He did receive 1 unit of blood earlier this week for anemia and noticed a little improvement in his energy. He has some  itching at the velcade injection sites without any infection  He denies any signs and symptoms of infection including nasal congestion, sore throat, skin infections, or dysuria.  He reports an easy bruising on his extremities, denies any abnormal bleeding.  Denies any unusual bone pain.  His back pain radiating down his right leg still bothers him and keeps him up at night. He takes tylenol. He has allergy to adhesives with lidoderm patches.  Sometimes taking his compazine helps him sleep better. This has been occurring for about 1 year. Denies any peripheral neuropathy.  Denies any nausea, vomiting, diarrhea, or constipation. He is trying to eat low potassium foods. He is here today for evaluation.      MEDICAL HISTORY: Past Medical History:  Diagnosis Date   Cancer (HCC)     ALLERGIES:  has No Known Allergies.  MEDICATIONS:  Current Outpatient Medications  Medication Sig Dispense Refill   acetaminophen (TYLENOL) 500 MG tablet Take 500 mg by mouth every 6 (six) hours as needed for moderate pain.     acyclovir (ZOVIRAX) 200 MG capsule Take 1 capsule (200 mg total) by mouth daily. 30 capsule 0   dexamethasone (DECADRON) 4 MG tablet Take 5 tablets by mouth once a week 40 tablet 2   ferric citrate (AURYXIA) 1 GM 210 MG(Fe) tablet Take 2 tablets (420 mg total) by mouth 3 (three) times daily with meals. 180 tablet 0   lidocaine (LIDODERM) 5 % Place 1 patch onto the skin daily.     prochlorperazine (COMPAZINE) 10 MG tablet Take 1 tablet (10 mg total) by mouth  every 6 (six) hours as needed. 30 tablet 2   sodium bicarbonate 650 MG tablet Take 1 tablet (650 mg total) by mouth 2 (two) times daily. 60 tablet 0   No current facility-administered medications for this visit.    SURGICAL HISTORY:  Past Surgical History:  Procedure Laterality Date   IR FLUORO GUIDE CV LINE RIGHT  11/13/2022   IR US GUIDE VASC ACCESS RIGHT  11/13/2022   KNEE SURGERY      REVIEW OF SYSTEMS:   Constitutional: Positive  for stable fatigue. Negative for appetite change, chills,  fever and unexpected weight change.  HENT:   Negative for mouth sores, nosebleeds, sore throat and trouble swallowing.   Eyes: Negative for eye problems and icterus.  Respiratory: Negative for cough, hemoptysis, shortness of breath and wheezing.   Cardiovascular: Negative for chest pain and leg swelling.  Gastrointestinal: Negative for abdominal pain, constipation, diarrhea, nausea and vomiting.  Genitourinary: Negative for bladder incontinence, difficulty urinating, dysuria, frequency and hematuria.   Musculoskeletal: Positive for back pain radiating down his right leg. Negative for gait problem, neck pain and neck stiffness.  Skin: Positive for mild redness at velcade site.  Neurological: Negative for dizziness, extremity weakness, gait problem, headaches, light-headedness and seizures.  Hematological: Negative for adenopathy. Does not bleed easily.  Positive for bruising. Psychiatric/Behavioral: Negative for confusion, depression and sleep disturbance. The patient is not nervous/anxious.       PHYSICAL EXAMINATION:  Blood pressure (!) 141/79, pulse 88, temperature 97.7 F (36.5 C), temperature source Oral, resp. rate 16, weight 173 lb 14.4 oz (78.9 kg), SpO2 100%.  ECOG PERFORMANCE STATUS: 1  Physical Exam  Constitutional: Oriented to person, place, and time and well-developed, well-nourished, and in no distress.  HENT:  Head: Normocephalic and atraumatic.  Mouth/Throat: Oropharynx is clear and moist. No oropharyngeal exudate.  Eyes: Conjunctivae are normal. Right eye exhibits no discharge. Left eye exhibits no discharge. No scleral icterus.  Neck: Normal range of motion. Neck supple.  Cardiovascular: Normal rate, regular rhythm, normal heart sounds and intact distal pulses.   Pulmonary/Chest: Effort normal and breath sounds normal. No respiratory distress. No wheezes. No rales.  Abdominal: Soft. Bowel sounds are normal.  Exhibits no distension and no mass. There is no tenderness.  Musculoskeletal: Normal range of motion. Exhibits no edema.  Lymphadenopathy:    No cervical adenopathy.  Neurological: Alert and oriented to person, place, and time. Exhibits normal muscle tone. Gait normal. Coordination normal.  Skin: Positive for bruising.  Skin is warm and dry. No rash noted. Not diaphoretic. No erythema. No pallor.  Psychiatric: Mood, memory and judgment normal.  Vitals reviewed.  LABORATORY DATA: Lab Results  Component Value Date   WBC 4.0 11/24/2022   HGB 7.6 (L) 11/24/2022   HCT 23.7 (L) 11/24/2022   MCV 96.3 11/24/2022   PLT 189 11/24/2022      Chemistry      Component Value Date/Time   NA 137 11/24/2022 1231   K 5.3 (H) 11/24/2022 1231   CL 111 11/24/2022 1231   CO2 16 (L) 11/24/2022 1231   BUN 85 (H) 11/24/2022 1231   CREATININE 7.16 (HH) 11/24/2022 1231      Component Value Date/Time   CALCIUM 7.8 (L) 11/24/2022 1231   ALKPHOS 34 (L) 11/24/2022 1231   AST 21 11/24/2022 1231   ALT 11 11/24/2022 1231   BILITOT 0.4 11/24/2022 1231       RADIOGRAPHIC STUDIES:  IR Fluoro Guide CV Line Right  Result  Date: 11/13/2022 INDICATION: Acute kidney injury EXAM: Tunneled hemodialysis catheter MEDICATIONS: Ancef 2 g IV; The antibiotic was administered within an appropriate time interval prior to skin puncture. ANESTHESIA/SEDATION: Moderate (conscious) sedation was employed during this procedure. A total of Versed 2 mg and Fentanyl 50 mcg was administered intravenously by the radiology nurse. Total intra-service moderate Sedation Time: 7 minutes. The patient's level of consciousness and vital signs were monitored continuously by radiology nursing throughout the procedure under my direct supervision. FLUOROSCOPY: Radiation Exposure Index (as provided by the fluoroscopic device): 6 mGy Kerma COMPLICATIONS: None immediate. PROCEDURE: Informed written consent was obtained from the patient after a thorough  discussion of the procedural risks, benefits and alternatives. All questions were addressed. Maximal Sterile Barrier Technique was utilized including caps, mask, sterile gowns, sterile gloves, sterile drape, hand hygiene and skin antiseptic. A timeout was performed prior to the initiation of the procedure. The right internal jugular vein was evaluated with ultrasound and shown to be patent. A permanent ultrasound image was obtained and placed in the patient's medical record. Using sterile gel and a sterile probe cover, the right internal jugular vein was entered with a 21 ga needle during real time ultrasound guidance. 0.018 inch guidewire placed and 21 ga needle exchanged for transitional dilator set. Utilizing fluoroscopy, 0.035 inch guidewire advanced through the dilator without difficulty. Seriel dilation was performed and peel-away sheath was placed. Attention then turned to the right anterior upper chest. Following local lidocaine administration, the hemodialysis catheter was tunneled from the chest wall to the venotomy site. The catheter was inserted through the peel-away sheath. The tip of the catheter was positioned within the right atrium using fluoroscopic guidance. All lumens of the catheter aspirated and flushed well. The dialysis lumens were locked with Heparin. The catheter was secured to the skin with suture. The insertion site was covered with sterile dressing. IMPRESSION: 19 cm tunneled right IJ hemodialysis catheter is ready for use. Electronically Signed   By: Acquanetta Belling M.D.   On: 11/13/2022 09:51   IR US Guide Vasc Access Right  Result Date: 11/13/2022 INDICATION: Acute kidney injury EXAM: Tunneled hemodialysis catheter MEDICATIONS: Ancef 2 g IV; The antibiotic was administered within an appropriate time interval prior to skin puncture. ANESTHESIA/SEDATION: Moderate (conscious) sedation was employed during this procedure. A total of Versed 2 mg and Fentanyl 50 mcg was administered  intravenously by the radiology nurse. Total intra-service moderate Sedation Time: 7 minutes. The patient's level of consciousness and vital signs were monitored continuously by radiology nursing throughout the procedure under my direct supervision. FLUOROSCOPY: Radiation Exposure Index (as provided by the fluoroscopic device): 6 mGy Kerma COMPLICATIONS: None immediate. PROCEDURE: Informed written consent was obtained from the patient after a thorough discussion of the procedural risks, benefits and alternatives. All questions were addressed. Maximal Sterile Barrier Technique was utilized including caps, mask, sterile gowns, sterile gloves, sterile drape, hand hygiene and skin antiseptic. A timeout was performed prior to the initiation of the procedure. The right internal jugular vein was evaluated with ultrasound and shown to be patent. A permanent ultrasound image was obtained and placed in the patient's medical record. Using sterile gel and a sterile probe cover, the right internal jugular vein was entered with a 21 ga needle during real time ultrasound guidance. 0.018 inch guidewire placed and 21 ga needle exchanged for transitional dilator set. Utilizing fluoroscopy, 0.035 inch guidewire advanced through the dilator without difficulty. Seriel dilation was performed and peel-away sheath was placed. Attention then turned to the  right anterior upper chest. Following local lidocaine administration, the hemodialysis catheter was tunneled from the chest wall to the venotomy site. The catheter was inserted through the peel-away sheath. The tip of the catheter was positioned within the right atrium using fluoroscopic guidance. All lumens of the catheter aspirated and flushed well. The dialysis lumens were locked with Heparin. The catheter was secured to the skin with suture. The insertion site was covered with sterile dressing. IMPRESSION: 19 cm tunneled right IJ hemodialysis catheter is ready for use. Electronically  Signed   By: Acquanetta Belling M.D.   On: 11/13/2022 09:51   CT BONE MARROW BIOPSY & ASPIRATION  Result Date: 11/11/2022 INDICATION: Multiple myeloma EXAM: CT GUIDED LEFT ILIAC BONE MARROW ASPIRATION AND CORE BIOPSY Date:  11/11/2022 11/11/2022 10:07 am Radiologist:  M. Ruel Favors, MD Guidance:  CT FLUOROSCOPY: Fluoroscopy Time: NONE. MEDICATIONS: 1% lidocaine local ANESTHESIA/SEDATION: 1.0 mg IV Versed; 50 mcg IV Fentanyl Moderate Sedation Time:  10 minute The patient was continuously monitored during the procedure by the interventional radiology nurse under my direct supervision. CONTRAST:  None COMPLICATIONS: None immediate PROCEDURE: Informed consent was obtained from the patient following explanation of the procedure, risks, benefits and alternatives. The patient understands, agrees and consents for the procedure. All questions were addressed. A time out was performed. The patient was positioned prone and non-contrast localization CT was performed of the pelvis to demonstrate the iliac marrow spaces. Maximal barrier sterile technique utilized including caps, mask, sterile gowns, sterile gloves, large sterile drape, hand hygiene, and Betadine prep. Under sterile conditions and local anesthesia, an 11 gauge coaxial bone biopsy needle was advanced into the left iliac marrow space. Needle position was confirmed with CT imaging. Initially, bone marrow aspiration was performed. Next, the 11 gauge outer cannula was utilized to obtain a left iliac bone marrow core biopsy. Needle was removed. Hemostasis was obtained with compression. The patient tolerated the procedure well. Samples were prepared with the cytotechnologist. No immediate complications. IMPRESSION: CT guided left iliac bone marrow aspiration and core biopsy. Electronically Signed   By: Judie Petit.  Shick M.D.   On: 11/11/2022 10:41   DG Bone Survey Met  Result Date: 11/09/2022 CLINICAL DATA:  Renal failure, lytic lesion of hip EXAM: METASTATIC BONE SURVEY  COMPARISON:  Partial comparison to CT abdomen/pelvis dated 11/08/2022 FINDINGS: No definite lytic calvarial lesions. Mild degenerative changes of the lower cervical spine, without definite lytic lesions. Mild compression fracture deformities of multiple midthoracic vertebral bodies, without definite lytic lesions. Degenerative changes. Mild degenerative changes of the lumbar spine, without definite lytic lesions. Small lytic lesions in the bilateral humeri. Small lytic lesion in the left proximal ulna. No lytic lesions in the right forearm. Mild increased interstitial markings in the lungs, without focal consolidation. The heart is normal in size. Thoracic aortic atherosclerosis. Suspected small pleural effusions on the lateral view (excluded on the frontal radiograph). Known lytic lesions in the iliac bones, including the right inferior pubic ramus, although better evaluated on CT. Multiple lytic lesions in the femurs bilaterally. No definite lytic lesions in the bilateral tibia/fibula. IMPRESSION: Multiple lytic lesions in the appendicular skeleton, as above, concerning for multiple myeloma or less likely metastatic disease. Electronically Signed   By: Charline Bills M.D.   On: 11/09/2022 19:03   US RENAL  Result Date: 11/08/2022 CLINICAL DATA:  Acute kidney injury. EXAM: RENAL / URINARY TRACT ULTRASOUND COMPLETE COMPARISON:  None Available. FINDINGS: Right Kidney: Renal measurements: 10.6 cm x 4.1 cm x 4.5 cm =  volume: 103 mL. Diffusely increased echogenicity of the renal parenchyma is noted. No mass or hydronephrosis visualized. Left Kidney: Renal measurements: 11.3 cm x 4.3 cm x 4.2 cm = volume: 105 mL. Diffusely increased echogenicity of the renal parenchyma is noted. No mass or hydronephrosis visualized. Bladder: Appears normal for degree of bladder distention. Other: None. IMPRESSION: Bilateral echogenic kidneys which may be secondary to medical renal disease. Electronically Signed   By: Aram Candela M.D.   On: 11/08/2022 18:44   CT ABDOMEN PELVIS WO CONTRAST  Result Date: 11/08/2022 CLINICAL DATA:  Epigastric pain EXAM: CT ABDOMEN AND PELVIS WITHOUT CONTRAST TECHNIQUE: Multidetector CT imaging of the abdomen and pelvis was performed following the standard protocol without IV contrast. RADIATION DOSE REDUCTION: This exam was performed according to the departmental dose-optimization program which includes automated exposure control, adjustment of the mA and/or kV according to patient size and/or use of iterative reconstruction technique. COMPARISON:  None Available. FINDINGS: Lower chest: Bilateral small pleural effusions. Hepatobiliary: No focal hepatic lesion. Normal gallbladder. No biliary duct dilatation. Common bile duct is normal. Pancreas: Pancreas is normal. No ductal dilatation. No pancreatic inflammation. Spleen: Normal spleen Adrenals/urinary tract: Adrenal glands normal. Nonobstructing calculi in the RIGHT kidney. Ureters and bladder normal. Stomach/Bowel: Stomach, small bowel, appendix, and cecum are normal. The colon and rectosigmoid colon are normal. Vascular/Lymphatic: Abdominal aorta is normal caliber with atherosclerotic calcification. There is no retroperitoneal or periportal lymphadenopathy. No pelvic lymphadenopathy. Reproductive: Prostate unremarkable Other: No free fluid. Musculoskeletal: Multiple subtle lucencies within the iliac bones including 12 mm lesion within the LEFT iliac bone on image 60/3. Small lesion on image 58 the same bone. Similar lesions in the RIGHT iliac bone on image 60/3. Mild permeative pattern in the superior border of the iliac wings. IMPRESSION: Subtle lucent lesions in the pelvic bones. Recommend correlation with myeloma serology. No acute findings abdomen pelvis. Bilateral pleural effusions. Electronically Signed   By: Genevive Bi M.D.   On: 11/08/2022 13:42   DG Chest Port 1 View  Result Date: 11/08/2022 CLINICAL DATA:  Dyspnea, anemia EXAM:  PORTABLE CHEST 1 VIEW COMPARISON:  11/16/2007 chest radiograph. FINDINGS: Stable cardiomediastinal silhouette with normal heart size. No pneumothorax. No pleural effusion. Mild diffuse prominence of the parahilar interstitial markings. IMPRESSION: Mild diffuse prominence of the parahilar interstitial markings, differential includes mild pulmonary edema versus atypical/viral infection. Electronically Signed   By: Delbert Phenix M.D.   On: 11/08/2022 13:28     ASSESSMENT/PLAN:  This is a very pleasant 57 year old Caucasian male diagnosed with multiple myeloma with 53% plasma cells on bone marrow biopsy and aspirate.   The patient is here to establish care on an outpatient basis.  He was recently hospitalized in August 2020 for which showed his diagnosis of multiple myeloma.  While admitted to the hospital he underwent day 1 cycle 1.  He rstarted treatment with subcutaneous Velcade 1.3 Mg/KG on days 1, 4, 8 and 11 every 3 weeks, cyclophosphamide 500 Mg/M2 IV on days 1 and 8 as well as Decadron 40 mg p.o. on weekly basis start with the first dose of Velcade on 11/14/22.  He is status post day 11 cycle 1 and has tolerated it well thus far.  The patient was seen with Dr. Arbutus Ped today.  Recommend to stay on the same treatment at the same dose at this time.  Once his renal function improves, he will change treatment to Darzalex, Velcade, Revlimid, and Decadron for 4 cycles followed by evaluation for autologous stem  cell transplant.  We will adjust cycle 2-day 1 to start on iron Monday on 12/08/22  His PET scan is scheduled for 12/04/22. Just to monitor his labs closely, I will arrange for a lab only visit after his PET scan.   We will arrange for blood transfusion if his hemoglobin is less than 8. I will add sample to blood bank  I will send in a new prescription of decadron to reflect the 40 mg of decadron weekly.   He will continue to follow closely with nephrology.   The patient was advised to call  immediately if he has any concerning symptoms in the interval. The patient voices understanding of current disease status and treatment options and is in agreement with the current care plan. All questions were answered. The patient knows to call the clinic with any problems, questions or concerns. We can certainly see the patient much sooner if necessary        Orders Placed This Encounter  Procedures   CBC with Differential (Cancer Center Only)    Standing Status:   Future    Standing Expiration Date:   11/27/2023   CMP (Cancer Center only)    Standing Status:   Future    Standing Expiration Date:   11/27/2023   Sample to Blood Bank    Standing Status:   Future    Standing Expiration Date:   11/27/2023     The total time spent in the appointment was 20-29 minutes  Jusitn Salsgiver L Nidia Grogan, PA-C 11/27/22

## 2022-11-24 NOTE — Telephone Encounter (Signed)
Pt has what he needs.

## 2022-11-24 NOTE — Progress Notes (Signed)
Patient informed of Hgb 7.6 and recommendation by Cassie H, PA to receive 1 unit PRBC this week.  Patient instructed to keep blue Blood Bank band on for blood transfusion.  Patient verbalized understanding and agreed to keep blood bank band on.

## 2022-11-24 NOTE — Progress Notes (Signed)
CRITICAL VALUE STICKER  CRITICAL VALUE: Creatinine 7.16   RECEIVER (on-site recipient of call):Danette Weinfeld,RN  DATE & TIME NOTIFIED: 11/24/22 @ 1313  MESSENGER (representative from lab):Marchelle Folks  MD NOTIFIED: Elonda Husky Heilingoetter, PA  TIME OF NOTIFICATION:1315  RESPONSE: Dialysis patient. Proceed to treat.

## 2022-11-24 NOTE — Patient Instructions (Signed)
[facility]  Discharge Instructions: Thank you for choosing [facility] to provide your oncology and hematology care.   If you have a lab appointment with the Cancer Center, please go directly to the Cancer Center and check in at the registration area.   Wear comfortable clothing and clothing appropriate for easy access to any Portacath or PICC line.   We strive to give you quality time with your provider. You may need to reschedule your appointment if you arrive late (15 or more minutes).  Arriving late affects you and other patients whose appointments are after yours.  Also, if you miss three or more appointments without notifying the office, you may be dismissed from the clinic at the provider's discretion.      For prescription refill requests, have your pharmacy contact our office and allow 72 hours for refills to be completed.    Today you received the following chemotherapy and/or immunotherapy agents Velcade      To help prevent nausea and vomiting after your treatment, we encourage you to take your nausea medication as directed.  BELOW ARE SYMPTOMS THAT SHOULD BE REPORTED IMMEDIATELY: *FEVER GREATER THAN 100.4 F (38 C) OR HIGHER *CHILLS OR SWEATING *NAUSEA AND VOMITING THAT IS NOT CONTROLLED WITH YOUR NAUSEA MEDICATION *UNUSUAL SHORTNESS OF BREATH *UNUSUAL BRUISING OR BLEEDING *URINARY PROBLEMS (pain or burning when urinating, or frequent urination) *BOWEL PROBLEMS (unusual diarrhea, constipation, pain near the anus) TENDERNESS IN MOUTH AND THROAT WITH OR WITHOUT PRESENCE OF ULCERS (sore throat, sores in mouth, or a toothache) UNUSUAL RASH, SWELLING OR PAIN  UNUSUAL VAGINAL DISCHARGE OR ITCHING   Items with * indicate a potential emergency and should be followed up as soon as possible or go to the Emergency Department if any problems should occur.  Please show the CHEMOTHERAPY ALERT CARD or IMMUNOTHERAPY ALERT CARD at check-in  to the Emergency Department and triage nurse.  Should you have questions after your visit or need to cancel or reschedule your appointment, please contact [facility]  Dept: [phone]  and follow the prompts.  Office hours are 8:00 a.m. to 4:30 p.m. Monday - Friday. Please note that voicemails left after 4:00 p.m. may not be returned until the following business day.  We are closed weekends and major holidays. You have access to a nurse at all times for urgent questions. Please call the main number to the clinic Dept: [phone] and follow the prompts.   For any non-urgent questions, you may also contact your provider using MyChart. We now offer e-Visits for anyone 44 and older to request care online for non-urgent symptoms. For details visit [email or web address].   Also download the MyChart app! Go to the app store, search "MyChart", open the app, select [facility], and log in with your MyChart username and password.

## 2022-11-25 ENCOUNTER — Ambulatory Visit: Payer: BC Managed Care – PPO

## 2022-11-25 ENCOUNTER — Other Ambulatory Visit: Payer: BC Managed Care – PPO

## 2022-11-25 ENCOUNTER — Inpatient Hospital Stay: Payer: BC Managed Care – PPO

## 2022-11-25 DIAGNOSIS — Z5112 Encounter for antineoplastic immunotherapy: Secondary | ICD-10-CM | POA: Diagnosis not present

## 2022-11-25 DIAGNOSIS — D631 Anemia in chronic kidney disease: Secondary | ICD-10-CM

## 2022-11-25 LAB — PREPARE RBC (CROSSMATCH)

## 2022-11-25 MED ORDER — ACETAMINOPHEN 325 MG PO TABS
650.0000 mg | ORAL_TABLET | Freq: Once | ORAL | Status: AC
  Administered 2022-11-25: 650 mg via ORAL
  Filled 2022-11-25: qty 2

## 2022-11-25 MED ORDER — DIPHENHYDRAMINE HCL 25 MG PO CAPS
25.0000 mg | ORAL_CAPSULE | Freq: Once | ORAL | Status: AC
  Administered 2022-11-25: 25 mg via ORAL
  Filled 2022-11-25: qty 1

## 2022-11-25 MED ORDER — SODIUM CHLORIDE 0.9% IV SOLUTION
250.0000 mL | Freq: Once | INTRAVENOUS | Status: AC
  Administered 2022-11-25: 250 mL via INTRAVENOUS

## 2022-11-25 NOTE — Progress Notes (Signed)
Per Dr. Arbutus Ped OK to transfusion blood at 300 mL/hr today.

## 2022-11-26 LAB — BPAM RBC
Blood Product Expiration Date: 202409032359
ISSUE DATE / TIME: 202408271547
Unit Type and Rh: 6200

## 2022-11-26 LAB — TYPE AND SCREEN
ABO/RH(D): AB POS
Antibody Screen: NEGATIVE
Unit division: 0

## 2022-11-27 ENCOUNTER — Other Ambulatory Visit: Payer: BC Managed Care – PPO

## 2022-11-27 ENCOUNTER — Inpatient Hospital Stay (HOSPITAL_BASED_OUTPATIENT_CLINIC_OR_DEPARTMENT_OTHER): Payer: BC Managed Care – PPO | Admitting: Physician Assistant

## 2022-11-27 ENCOUNTER — Ambulatory Visit: Payer: BC Managed Care – PPO

## 2022-11-27 ENCOUNTER — Encounter: Payer: Self-pay | Admitting: Internal Medicine

## 2022-11-27 ENCOUNTER — Other Ambulatory Visit: Payer: Self-pay | Admitting: Physician Assistant

## 2022-11-27 VITALS — BP 141/79 | HR 88 | Temp 97.7°F | Resp 16 | Wt 173.9 lb

## 2022-11-27 DIAGNOSIS — D631 Anemia in chronic kidney disease: Secondary | ICD-10-CM

## 2022-11-27 DIAGNOSIS — N185 Chronic kidney disease, stage 5: Secondary | ICD-10-CM

## 2022-11-27 DIAGNOSIS — C9000 Multiple myeloma not having achieved remission: Secondary | ICD-10-CM

## 2022-11-27 DIAGNOSIS — Z5112 Encounter for antineoplastic immunotherapy: Secondary | ICD-10-CM | POA: Diagnosis not present

## 2022-11-27 MED ORDER — DEXAMETHASONE 4 MG PO TABS
ORAL_TABLET | ORAL | 4 refills | Status: DC
Start: 2022-11-27 — End: 2023-09-02

## 2022-11-27 NOTE — Progress Notes (Signed)
Patient signed grant paperwork today at registration.  Patient approved for one-time $1000 [employer] grant to assist with personal expenses while going through treatment. He received a gas card today from his grant. He has a copy of the approval letter and expense sheet in green folder.   He has my card to contact me to discuss other expenses in detail.

## 2022-11-28 ENCOUNTER — Encounter: Payer: Self-pay | Admitting: Internal Medicine

## 2022-11-29 ENCOUNTER — Other Ambulatory Visit: Payer: Self-pay

## 2022-12-02 ENCOUNTER — Encounter: Payer: Self-pay | Admitting: Internal Medicine

## 2022-12-04 ENCOUNTER — Telehealth: Payer: Self-pay | Admitting: Medical Oncology

## 2022-12-04 ENCOUNTER — Inpatient Hospital Stay: Payer: BC Managed Care – PPO | Attending: Internal Medicine

## 2022-12-04 ENCOUNTER — Encounter (HOSPITAL_COMMUNITY)
Admission: RE | Admit: 2022-12-04 | Discharge: 2022-12-04 | Disposition: A | Discharge status: Home / Self Care | Payer: BC Managed Care – PPO | Source: Ambulatory Visit | Attending: Physician Assistant | Admitting: Physician Assistant

## 2022-12-04 ENCOUNTER — Other Ambulatory Visit: Payer: Self-pay | Admitting: Physician Assistant

## 2022-12-04 ENCOUNTER — Telehealth: Payer: Self-pay | Admitting: *Deleted

## 2022-12-04 DIAGNOSIS — Z5111 Encounter for antineoplastic chemotherapy: Secondary | ICD-10-CM | POA: Insufficient documentation

## 2022-12-04 DIAGNOSIS — D649 Anemia, unspecified: Secondary | ICD-10-CM | POA: Insufficient documentation

## 2022-12-04 DIAGNOSIS — C9000 Multiple myeloma not having achieved remission: Secondary | ICD-10-CM | POA: Insufficient documentation

## 2022-12-04 DIAGNOSIS — Z5112 Encounter for antineoplastic immunotherapy: Secondary | ICD-10-CM | POA: Insufficient documentation

## 2022-12-04 DIAGNOSIS — E875 Hyperkalemia: Secondary | ICD-10-CM

## 2022-12-04 LAB — CBC WITH DIFFERENTIAL (CANCER CENTER ONLY)
Abs Immature Granulocytes: 0.01 10*3/uL (ref 0.00–0.07)
Basophils Absolute: 0.1 10*3/uL (ref 0.0–0.1)
Basophils Relative: 4 %
Eosinophils Absolute: 0.1 10*3/uL (ref 0.0–0.5)
Eosinophils Relative: 3 %
HCT: 26.5 % — ABNORMAL LOW (ref 39.0–52.0)
Hemoglobin: 8.8 g/dL — ABNORMAL LOW (ref 13.0–17.0)
Immature Granulocytes: 0 %
Lymphocytes Relative: 26 %
Lymphs Abs: 0.7 10*3/uL (ref 0.7–4.0)
MCH: 32 pg (ref 26.0–34.0)
MCHC: 33.2 g/dL (ref 30.0–36.0)
MCV: 96.4 fL (ref 80.0–100.0)
Monocytes Absolute: 0.3 10*3/uL (ref 0.1–1.0)
Monocytes Relative: 13 %
Neutro Abs: 1.5 10*3/uL — ABNORMAL LOW (ref 1.7–7.7)
Neutrophils Relative %: 54 %
Platelet Count: 258 10*3/uL (ref 150–400)
RBC: 2.75 MIL/uL — ABNORMAL LOW (ref 4.22–5.81)
RDW: 17.2 % — ABNORMAL HIGH (ref 11.5–15.5)
WBC Count: 2.7 10*3/uL — ABNORMAL LOW (ref 4.0–10.5)
nRBC: 0 % (ref 0.0–0.2)

## 2022-12-04 LAB — CMP (CANCER CENTER ONLY)
ALT: 15 U/L (ref 0–44)
AST: 19 U/L (ref 15–41)
Albumin: 4.3 g/dL (ref 3.5–5.0)
Alkaline Phosphatase: 87 U/L (ref 38–126)
Anion gap: 8 (ref 5–15)
BUN: 95 mg/dL — ABNORMAL HIGH (ref 6–20)
CO2: 23 mmol/L (ref 22–32)
Calcium: 7.4 mg/dL — ABNORMAL LOW (ref 8.9–10.3)
Chloride: 108 mmol/L (ref 98–111)
Creatinine: 8.53 mg/dL (ref 0.61–1.24)
GFR, Estimated: 7 mL/min — ABNORMAL LOW (ref >60)
Glucose, Bld: 92 mg/dL (ref 70–99)
Potassium: 5.7 mmol/L — ABNORMAL HIGH (ref 3.5–5.1)
Sodium: 139 mmol/L (ref 135–145)
Total Bilirubin: 0.3 mg/dL (ref 0.3–1.2)
Total Protein: 6.5 g/dL (ref 6.5–8.1)

## 2022-12-04 LAB — GLUCOSE, CAPILLARY: Glucose-Capillary: 101 mg/dL — ABNORMAL HIGH (ref 70–99)

## 2022-12-04 MED ORDER — FLUDEOXYGLUCOSE F - 18 (FDG) INJECTION
8.6200 | Freq: Once | INTRAVENOUS | Status: AC
  Administered 2022-12-04: 8.62 via INTRAVENOUS

## 2022-12-04 MED ORDER — SODIUM POLYSTYRENE SULFONATE PO POWD
Freq: Once | ORAL | 0 refills | Status: AC
Start: 2022-12-04 — End: 2022-12-04

## 2022-12-04 NOTE — Telephone Encounter (Signed)
CRITICAL VALUE STICKER  CRITICAL VALUE:HGB 8.53  RECEIVER (on-site recipient of call):Anish Vana  DATE & TIME NOTIFIED: 12/04/2022 @ 1125  MESSENGER (representative from lab):  MD NOTIFIED: Cassie  TIME OF NOTIFICATION:1304  RESPONSE:  2 weeks ago 8.53, Pt has CKD stage V

## 2022-12-04 NOTE — Progress Notes (Signed)
[facility] OFFICE PROGRESS NOTE  Drew Bender, MD [address] [place] [address]  DIAGNOSIS: Recently diagnosed with multiple myeloma with lytic bone lesions in addition to renal insufficiency and bone marrow biopsy and aspirate consistent with plasma cell neoplasm with 53% plasma cells. This was diagnosed in August 2024.   PRIOR THERAPY:  Plasmapheresis status post ~4 treatments   CURRENT THERAPY: First dose of treatment given while inpatient with  subcutaneous Velcade 1.3 Mg/KG on days 1, 4, 8 and 11 every 3 weeks, cyclophosphamide 500 Mg/M2 IV on days 1 and 8 as well as Decadron 40 mg p.o. on weekly basis start with the first dose of Velcade. Once his renal function improves, Dr. Arbutus Ped will likely recommend changing treatment to standard treatment with daratumumab, Velcade, Revlimid and Decadron for 4 cycles followed by evaluation for autologous stem cell transplant. He is here for cycle #2 today.   INTERVAL HISTORY: Drew Cooper 57 y.o. male returns to the clinic today for a follow-up visit accompanied by his wife.  The patient was recently diagnosed with multiple myeloma.  He presented to the hospital in August with severe anemia, marked elevated creatinine, and hypercalcemia.  His bone marrow biopsy and aspirate showed 53% plasma cells he also has multiple pelvic lucencies.  He completed his first cycle of treatment.  He tolerated it fairly well.  He had a PET scan on 12/04/2022 showing no hypermetabolic osseous or soft tissue lesions. The bones are diffusely demineralized with scattered small lucent lesions which could relate to the patient's multiple myeloma. No pathologic fractures are seen.   He has underwent plasmapheresis and he is followed closely by nephrology.  He is already trying to maintain a low potassium diet.  Despite this his labs last week showed hyperkalemia.  He was given a prescription of Kayexalate.  He took this as prescribed.  His next  appointment with Dr. Allena Katz from nephrology is on 01/01/2023.  The patient is feeling fairly well today.  His fatigue is stable.  He denies any signs and symptoms of infection including nasal congestion, sore throat, skin infections, or dysuria.  He reports an easy bruising on his extremities, denies any abnormal bleeding. He takes tylenol if needed and knows to avoid NSAIDs. Denies any peripheral neuropathy.  He mentions his hands "lock up" on him sometimes. Denies any nausea, vomiting, diarrhea, or constipation.  Denies any fevers, chills, or night sweats.  He denies any cough, chest pain, or shortness of breath.  He took his weekly Decadron on 12/05/2022.  He is here for evaluation and repeat blood work before undergoing day 1 cycle 2.     MEDICAL HISTORY: Past Medical History:  Diagnosis Date   Cancer (HCC)     ALLERGIES:  has No Known Allergies.  MEDICATIONS:  Current Outpatient Medications  Medication Sig Dispense Refill   acetaminophen (TYLENOL) 500 MG tablet Take 500 mg by mouth every 6 (six) hours as needed for moderate pain.     acyclovir (ZOVIRAX) 200 MG capsule Take 1 capsule (200 mg total) by mouth daily. 30 capsule 0   dexamethasone (DECADRON) 4 MG tablet Take 10 tablets by mouth once a week, skip 9/6 and take it 9/9 instead with treatment (Patient taking differently: Take 10 tablets by mouth once a week, skip 9/6 and take it 9/9 instead with treatment. PT STATES HE TOOK ON 12/05/22) 40 tablet 4   ferric citrate (AURYXIA) 1 GM 210 MG(Fe) tablet Take 2 tablets (420 mg  total) by mouth 3 (three) times daily with meals. 180 tablet 0   lidocaine (LIDODERM) 5 % Place 1 patch onto the skin daily.     prochlorperazine (COMPAZINE) 10 MG tablet Take 1 tablet (10 mg total) by mouth every 6 (six) hours as needed. 30 tablet 2   sodium bicarbonate 650 MG tablet Take 1 tablet (650 mg total) by mouth 2 (two) times daily. 60 tablet 0   No current facility-administered medications for this visit.     SURGICAL HISTORY:  Past Surgical History:  Procedure Laterality Date   IR FLUORO GUIDE CV LINE RIGHT  11/13/2022   IR US GUIDE VASC ACCESS RIGHT  11/13/2022   KNEE SURGERY      REVIEW OF SYSTEMS:   Review of Systems  Constitutional: Positive for stable fatigue. Negative for appetite change, chills, fever.  HENT: Negative for mouth sores, nosebleeds, sore throat and trouble swallowing.   Eyes: Negative for eye problems and icterus.  Respiratory: Negative for cough, hemoptysis, shortness of breath and wheezing.   Cardiovascular: Negative for chest pain and leg swelling.  Gastrointestinal: Negative for abdominal pain, constipation, diarrhea, nausea and vomiting.  Genitourinary: Negative for bladder incontinence, difficulty urinating, dysuria, frequency and hematuria.   Musculoskeletal: Positive for stable back pain radiating down his right leg. Negative for gait problem, neck pain and neck stiffness. Positive for hands "locking up" Skin: Negative for itching and rash.  Neurological: Negative for dizziness, extremity weakness, gait problem, headaches, light-headedness and seizures.  Hematological: Negative for adenopathy. Does not bruise/bleed easily.  Psychiatric/Behavioral: Negative for confusion, depression and sleep disturbance. The patient is not nervous/anxious.     PHYSICAL EXAMINATION:  Blood pressure 128/73, pulse 82, temperature 97.9 F (36.6 C), temperature source Oral, resp. rate 15, weight 179 lb (81.2 kg), SpO2 100%.  ECOG PERFORMANCE STATUS: 1  Physical Exam  Constitutional: Oriented to person, place, and time and well-developed, well-nourished, and in no distress.  HENT:  Head: Normocephalic and atraumatic.  Mouth/Throat: Oropharynx is clear and moist. No oropharyngeal exudate.  Eyes: Conjunctivae are normal. Right eye exhibits no discharge. Left eye exhibits no discharge. No scleral icterus.  Neck: Normal range of motion. Neck supple.  Cardiovascular: Normal  rate, regular rhythm, normal heart sounds and intact distal pulses.   Pulmonary/Chest: Effort normal and breath sounds normal. No respiratory distress. No wheezes. No rales.  Abdominal: Soft. Bowel sounds are normal. Exhibits no distension and no mass. There is no tenderness.  Musculoskeletal: Normal range of motion. Exhibits no edema.  Lymphadenopathy:    No cervical adenopathy.  Neurological: Alert and oriented to person, place, and time. Exhibits normal muscle tone. Gait normal. Coordination normal.  Skin: Positive for bruising.  Skin is warm and dry. No rash noted. Not diaphoretic. No erythema. No pallor.  Psychiatric: Mood, memory and judgment normal.  Vitals reviewed.  LABORATORY DATA: Lab Results  Component Value Date   WBC 2.9 (L) 12/08/2022   HGB 7.8 (L) 12/08/2022   HCT 23.9 (L) 12/08/2022   MCV 96.0 12/08/2022   PLT 241 12/08/2022      Chemistry      Component Value Date/Time   NA 138 12/08/2022 0836   K 5.0 12/08/2022 0836   CL 108 12/08/2022 0836   CO2 19 (L) 12/08/2022 0836   BUN 85 (H) 12/08/2022 0836   CREATININE 8.77 (HH) 12/08/2022 0836      Component Value Date/Time   CALCIUM 6.8 (L) 12/08/2022 0836   ALKPHOS 84 12/08/2022 0836  AST 19 12/08/2022 0836   ALT 16 12/08/2022 0836   BILITOT 0.3 12/08/2022 0836       RADIOGRAPHIC STUDIES:  NM PET Image Initial (PI) Whole Body  Result Date: 12/05/2022 CLINICAL DATA:  Initial treatment strategy for multiple myeloma. EXAM: NUCLEAR MEDICINE PET WHOLE BODY TECHNIQUE: 8.62 mCi F-18 FDG was injected intravenously. Full-ring PET imaging was performed from the head to foot after the radiotracer. CT data was obtained and used for attenuation correction and anatomic localization. Fasting blood glucose: 101 mg/dl COMPARISON:  Abdominopelvic CT 11/08/2022 FINDINGS: Mediastinal blood pool activity: SUV max 2.8 HEAD/ NECK: No hypermetabolic cervical lymph nodes are identified. No suspicious activity identified within the  pharyngeal mucosal space. Incidental CT findings: Prominent hypermetabolic activity within the parotid glands bilaterally which may be inflammatory or treatment related. Bilateral carotid atherosclerosis. CHEST: There are no hypermetabolic mediastinal, hilar or axillary lymph nodes. No hypermetabolic pulmonary activity or suspicious nodularity. Incidental CT findings: Right IJ central venous catheter extends to the superior cavoatrial junction. Atherosclerosis of the aorta, great vessels and coronary arteries. Previously demonstrated pleural effusions have resolved. ABDOMEN/PELVIS: There is no hypermetabolic activity within the liver, adrenal glands, spleen or pancreas. There is no hypermetabolic nodal activity in the abdomen or pelvis. Incidental CT findings: Several small nonobstructing right renal calculi are again noted. No evidence of ureteral calculus or hydronephrosis. Aortic and branch vessel atherosclerosis without evidence of aneurysm. SKELETON: No hypermetabolic osseous lesions are identified. The bones are diffusely demineralized, with scattered small lucent lesions throughout the axial and proximal appendicular skeleton which could relate to the patient's multiple myeloma. No pathologic fractures are seen. Incidental CT findings: Mild spondylosis. EXTREMITIES: No hypermetabolic osseous or soft tissue lesions within the extremities. Incidental CT findings: No suspicious lytic lesions or pathologic fractures identified distally within the extremities. IMPRESSION: 1. No hypermetabolic osseous or soft tissue lesions are identified to suggest active multiple myeloma. 2. The bones are diffusely demineralized with scattered small lucent lesions which could relate to the patient's multiple myeloma. No pathologic fractures are seen. 3. No suspicious soft tissue findings. 4. Nonobstructing right renal calculi. 5.  Aortic Atherosclerosis (ICD10-I70.0). Electronically Signed   By: Carey Bullocks M.D.   On:  12/05/2022 16:48   IR Fluoro Guide CV Line Right  Result Date: 11/13/2022 INDICATION: Acute kidney injury EXAM: Tunneled hemodialysis catheter MEDICATIONS: Ancef 2 g IV; The antibiotic was administered within an appropriate time interval prior to skin puncture. ANESTHESIA/SEDATION: Moderate (conscious) sedation was employed during this procedure. A total of Versed 2 mg and Fentanyl 50 mcg was administered intravenously by the radiology nurse. Total intra-service moderate Sedation Time: 7 minutes. The patient's level of consciousness and vital signs were monitored continuously by radiology nursing throughout the procedure under my direct supervision. FLUOROSCOPY: Radiation Exposure Index (as provided by the fluoroscopic device): 6 mGy Kerma COMPLICATIONS: None immediate. PROCEDURE: Informed written consent was obtained from the patient after a thorough discussion of the procedural risks, benefits and alternatives. All questions were addressed. Maximal Sterile Barrier Technique was utilized including caps, mask, sterile gowns, sterile gloves, sterile drape, hand hygiene and skin antiseptic. A timeout was performed prior to the initiation of the procedure. The right internal jugular vein was evaluated with ultrasound and shown to be patent. A permanent ultrasound image was obtained and placed in the patient's medical record. Using sterile gel and a sterile probe cover, the right internal jugular vein was entered with a 21 ga needle during real time ultrasound guidance. 0.018 inch guidewire placed  and 21 ga needle exchanged for transitional dilator set. Utilizing fluoroscopy, 0.035 inch guidewire advanced through the dilator without difficulty. Seriel dilation was performed and peel-away sheath was placed. Attention then turned to the right anterior upper chest. Following local lidocaine administration, the hemodialysis catheter was tunneled from the chest wall to the venotomy site. The catheter was inserted through  the peel-away sheath. The tip of the catheter was positioned within the right atrium using fluoroscopic guidance. All lumens of the catheter aspirated and flushed well. The dialysis lumens were locked with Heparin. The catheter was secured to the skin with suture. The insertion site was covered with sterile dressing. IMPRESSION: 19 cm tunneled right IJ hemodialysis catheter is ready for use. Electronically Signed   By: Acquanetta Belling M.D.   On: 11/13/2022 09:51   IR US Guide Vasc Access Right  Result Date: 11/13/2022 INDICATION: Acute kidney injury EXAM: Tunneled hemodialysis catheter MEDICATIONS: Ancef 2 g IV; The antibiotic was administered within an appropriate time interval prior to skin puncture. ANESTHESIA/SEDATION: Moderate (conscious) sedation was employed during this procedure. A total of Versed 2 mg and Fentanyl 50 mcg was administered intravenously by the radiology nurse. Total intra-service moderate Sedation Time: 7 minutes. The patient's level of consciousness and vital signs were monitored continuously by radiology nursing throughout the procedure under my direct supervision. FLUOROSCOPY: Radiation Exposure Index (as provided by the fluoroscopic device): 6 mGy Kerma COMPLICATIONS: None immediate. PROCEDURE: Informed written consent was obtained from the patient after a thorough discussion of the procedural risks, benefits and alternatives. All questions were addressed. Maximal Sterile Barrier Technique was utilized including caps, mask, sterile gowns, sterile gloves, sterile drape, hand hygiene and skin antiseptic. A timeout was performed prior to the initiation of the procedure. The right internal jugular vein was evaluated with ultrasound and shown to be patent. A permanent ultrasound image was obtained and placed in the patient's medical record. Using sterile gel and a sterile probe cover, the right internal jugular vein was entered with a 21 ga needle during real time ultrasound guidance. 0.018  inch guidewire placed and 21 ga needle exchanged for transitional dilator set. Utilizing fluoroscopy, 0.035 inch guidewire advanced through the dilator without difficulty. Seriel dilation was performed and peel-away sheath was placed. Attention then turned to the right anterior upper chest. Following local lidocaine administration, the hemodialysis catheter was tunneled from the chest wall to the venotomy site. The catheter was inserted through the peel-away sheath. The tip of the catheter was positioned within the right atrium using fluoroscopic guidance. All lumens of the catheter aspirated and flushed well. The dialysis lumens were locked with Heparin. The catheter was secured to the skin with suture. The insertion site was covered with sterile dressing. IMPRESSION: 19 cm tunneled right IJ hemodialysis catheter is ready for use. Electronically Signed   By: Acquanetta Belling M.D.   On: 11/13/2022 09:51   CT BONE MARROW BIOPSY & ASPIRATION  Result Date: 11/11/2022 INDICATION: Multiple myeloma EXAM: CT GUIDED LEFT ILIAC BONE MARROW ASPIRATION AND CORE BIOPSY Date:  11/11/2022 11/11/2022 10:07 am Radiologist:  M. Ruel Favors, MD Guidance:  CT FLUOROSCOPY: Fluoroscopy Time: NONE. MEDICATIONS: 1% lidocaine local ANESTHESIA/SEDATION: 1.0 mg IV Versed; 50 mcg IV Fentanyl Moderate Sedation Time:  10 minute The patient was continuously monitored during the procedure by the interventional radiology nurse under my direct supervision. CONTRAST:  None COMPLICATIONS: None immediate PROCEDURE: Informed consent was obtained from the patient following explanation of the procedure, risks, benefits and alternatives. The patient understands,  agrees and consents for the procedure. All questions were addressed. A time out was performed. The patient was positioned prone and non-contrast localization CT was performed of the pelvis to demonstrate the iliac marrow spaces. Maximal barrier sterile technique utilized including caps, mask,  sterile gowns, sterile gloves, large sterile drape, hand hygiene, and Betadine prep. Under sterile conditions and local anesthesia, an 11 gauge coaxial bone biopsy needle was advanced into the left iliac marrow space. Needle position was confirmed with CT imaging. Initially, bone marrow aspiration was performed. Next, the 11 gauge outer cannula was utilized to obtain a left iliac bone marrow core biopsy. Needle was removed. Hemostasis was obtained with compression. The patient tolerated the procedure well. Samples were prepared with the cytotechnologist. No immediate complications. IMPRESSION: CT guided left iliac bone marrow aspiration and core biopsy. Electronically Signed   By: Judie Petit.  Shick M.D.   On: 11/11/2022 10:41   DG Bone Survey Met  Result Date: 11/09/2022 CLINICAL DATA:  Renal failure, lytic lesion of hip EXAM: METASTATIC BONE SURVEY COMPARISON:  Partial comparison to CT abdomen/pelvis dated 11/08/2022 FINDINGS: No definite lytic calvarial lesions. Mild degenerative changes of the lower cervical spine, without definite lytic lesions. Mild compression fracture deformities of multiple midthoracic vertebral bodies, without definite lytic lesions. Degenerative changes. Mild degenerative changes of the lumbar spine, without definite lytic lesions. Small lytic lesions in the bilateral humeri. Small lytic lesion in the left proximal ulna. No lytic lesions in the right forearm. Mild increased interstitial markings in the lungs, without focal consolidation. The heart is normal in size. Thoracic aortic atherosclerosis. Suspected small pleural effusions on the lateral view (excluded on the frontal radiograph). Known lytic lesions in the iliac bones, including the right inferior pubic ramus, although better evaluated on CT. Multiple lytic lesions in the femurs bilaterally. No definite lytic lesions in the bilateral tibia/fibula. IMPRESSION: Multiple lytic lesions in the appendicular skeleton, as above, concerning  for multiple myeloma or less likely metastatic disease. Electronically Signed   By: Charline Bills M.D.   On: 11/09/2022 19:03   US RENAL  Result Date: 11/08/2022 CLINICAL DATA:  Acute kidney injury. EXAM: RENAL / URINARY TRACT ULTRASOUND COMPLETE COMPARISON:  None Available. FINDINGS: Right Kidney: Renal measurements: 10.6 cm x 4.1 cm x 4.5 cm = volume: 103 mL. Diffusely increased echogenicity of the renal parenchyma is noted. No mass or hydronephrosis visualized. Left Kidney: Renal measurements: 11.3 cm x 4.3 cm x 4.2 cm = volume: 105 mL. Diffusely increased echogenicity of the renal parenchyma is noted. No mass or hydronephrosis visualized. Bladder: Appears normal for degree of bladder distention. Other: None. IMPRESSION: Bilateral echogenic kidneys which may be secondary to medical renal disease. Electronically Signed   By: Aram Candela M.D.   On: 11/08/2022 18:44   CT ABDOMEN PELVIS WO CONTRAST  Result Date: 11/08/2022 CLINICAL DATA:  Epigastric pain EXAM: CT ABDOMEN AND PELVIS WITHOUT CONTRAST TECHNIQUE: Multidetector CT imaging of the abdomen and pelvis was performed following the standard protocol without IV contrast. RADIATION DOSE REDUCTION: This exam was performed according to the departmental dose-optimization program which includes automated exposure control, adjustment of the mA and/or kV according to patient size and/or use of iterative reconstruction technique. COMPARISON:  None Available. FINDINGS: Lower chest: Bilateral small pleural effusions. Hepatobiliary: No focal hepatic lesion. Normal gallbladder. No biliary duct dilatation. Common bile duct is normal. Pancreas: Pancreas is normal. No ductal dilatation. No pancreatic inflammation. Spleen: Normal spleen Adrenals/urinary tract: Adrenal glands normal. Nonobstructing calculi in the RIGHT kidney.  Ureters and bladder normal. Stomach/Bowel: Stomach, small bowel, appendix, and cecum are normal. The colon and rectosigmoid colon are  normal. Vascular/Lymphatic: Abdominal aorta is normal caliber with atherosclerotic calcification. There is no retroperitoneal or periportal lymphadenopathy. No pelvic lymphadenopathy. Reproductive: Prostate unremarkable Other: No free fluid. Musculoskeletal: Multiple subtle lucencies within the iliac bones including 12 mm lesion within the LEFT iliac bone on image 60/3. Small lesion on image 58 the same bone. Similar lesions in the RIGHT iliac bone on image 60/3. Mild permeative pattern in the superior border of the iliac wings. IMPRESSION: Subtle lucent lesions in the pelvic bones. Recommend correlation with myeloma serology. No acute findings abdomen pelvis. Bilateral pleural effusions. Electronically Signed   By: Genevive Bi M.D.   On: 11/08/2022 13:42   DG Chest Port 1 View  Result Date: 11/08/2022 CLINICAL DATA:  Dyspnea, anemia EXAM: PORTABLE CHEST 1 VIEW COMPARISON:  11/16/2007 chest radiograph. FINDINGS: Stable cardiomediastinal silhouette with normal heart size. No pneumothorax. No pleural effusion. Mild diffuse prominence of the parahilar interstitial markings. IMPRESSION: Mild diffuse prominence of the parahilar interstitial markings, differential includes mild pulmonary edema versus atypical/viral infection. Electronically Signed   By: Delbert Phenix M.D.   On: 11/08/2022 13:28     ASSESSMENT/PLAN:  This is a very pleasant 57 year old Caucasian male diagnosed with multiple myeloma with 53% plasma cells on bone marrow biopsy and aspirate.   The patient is here to establish care on an outpatient basis.  He was recently hospitalized in August 2020 for which showed his diagnosis of multiple myeloma.  While admitted to the hospital he underwent day 1 cycle 1.   He rstarted treatment with subcutaneous Velcade 1.3 Mg/KG on days 1, 4, 8 and 11 every 3 weeks, cyclophosphamide 500 Mg/M2 IV on days 1 and 8 as well as Decadron 40 mg p.o. on weekly basis start with the first dose of Velcade on 11/14/22.   He is status post day 11 cycle 1 and has tolerated it well thus far.  The patient was seen with Dr. Arbutus Ped today. Recommend to stay on the same treatment at the same dose at this time. Once/if his renal function improves, he will change treatment to Darzalex, Velcade, Revlimid, and Decadron for 4 cycles followed by evaluation for autologous stem cell transplant.   Dr. Arbutus Ped personally and independently reviewed his PET scan results.  He reviewed the results with the patient today.  The scan showed no hypermetabolic osseous or soft tissue lesions. The bones are diffusely demineralized with scattered small lucent lesions which could relate to the patient's multiple myeloma. No pathologic fractures are seen.   Labs were reviewed.  Recommend that he proceed with day 1 cycle 2 today scheduled.  His hemoglobin is 7.8.  We will arrange for 1 unit of blood this week.  His creatinine still continues to be elevated at 8.77.  We will fax results to Dr. Allena Katz from [facility].  His next appointment with kidney specialist is on 01/01/2023.  We will arrange for blood transfusion if the patient's hemoglobin is less than 8.  He has standing orders for sample blood bank.  His potassium is 5.0 today.  Will continue to watch this closely and arrange for Kayexalate as needed.  His calcium is low.  This likely could be contributing to his spasms in his hands.  He was advised to pick up a calcium supplement.  He is wondering if he needs to continue taking iron.  Let him know he can continue taking  an iron supplement.  He will continue to have lab work performed weekly.  We will see him back every 1 to 2 weeks for close monitoring.   The patient was advised to call immediately if he has any concerning symptoms in the interval. The patient voices understanding of current disease status and treatment options and is in agreement with the current care plan. All questions were answered. The patient knows to call  the clinic with any problems, questions or concerns. We can certainly see the patient much sooner if necessary   No orders of the defined types were placed in this encounter.   Nicolemarie Wooley L Milayna Rotenberg, PA-C 12/08/22  ADDENDUM: Hematology/Oncology Attending: I had a face-to-face encounter with the patient today.  I reviewed his record, lab and recommended his care plan.  This is a very pleasant 57 years old white male diagnosed with multiple myeloma with lytic lesions in addition to renal insufficiency and bone marrow biopsy and aspirate showed plasma cells 53% diagnosed in August 2024.  The patient started on plasmapheresis for his renal insufficiency and received 4 treatments. He is currently undergoing systemic therapy with subcutaneous Velcade, Cytoxan and Decadron status post 1 cycle.  He tolerated the first cycle of his treatment well with no concerning adverse effects. He had a whole-body PET scan performed recently that showed no hypermetabolic osseous or soft tissue lesions identified to suggest active multiple myeloma but the bones are diffusely demineralized with the scattered small lucent lesions that could be related to the patient has multiple myeloma and no pathologic fractures were seen. I recommended for the patient to continue his current treatment with the same regimen for now until improvement of his renal function. He will come back for follow-up visit in 2 weeks for evaluation and close monitoring of his condition. The patient was advised to call immediately if he has any other concerning symptoms in the interval. The total time spent in the appointment was 30 minutes. Disclaimer: This note was dictated with voice recognition software. Similar sounding words can inadvertently be transcribed and may be missed upon review. Lajuana Matte, MD

## 2022-12-04 NOTE — Telephone Encounter (Signed)
Notified Dezi of message from [employer], [place]: "His potassium keeps creeping up. he is at the point I need to give him something to lower it. I am sending in kayexelate. Can you please let him know? Please educate him that this binds up potassium and pulls it out in his stool. Therefore, this likely will cause loose stools. please let him know may be best to stay home after taking this. However, he does need to take it because elevated potassium is dangerous and can cause heart arrhythmias."\   Labs faxed to Dr Allena Katz, Nephrologist

## 2022-12-08 ENCOUNTER — Other Ambulatory Visit: Payer: Self-pay

## 2022-12-08 ENCOUNTER — Encounter: Payer: Self-pay | Admitting: Medical Oncology

## 2022-12-08 ENCOUNTER — Other Ambulatory Visit: Payer: Self-pay | Admitting: Physician Assistant

## 2022-12-08 ENCOUNTER — Inpatient Hospital Stay: Payer: BC Managed Care – PPO

## 2022-12-08 ENCOUNTER — Inpatient Hospital Stay (HOSPITAL_BASED_OUTPATIENT_CLINIC_OR_DEPARTMENT_OTHER): Payer: BC Managed Care – PPO | Admitting: Physician Assistant

## 2022-12-08 VITALS — BP 128/73 | HR 82 | Temp 97.9°F | Resp 15 | Wt 179.0 lb

## 2022-12-08 VITALS — BP 131/82 | HR 79 | Temp 98.7°F

## 2022-12-08 DIAGNOSIS — C9000 Multiple myeloma not having achieved remission: Secondary | ICD-10-CM

## 2022-12-08 DIAGNOSIS — Z5111 Encounter for antineoplastic chemotherapy: Secondary | ICD-10-CM | POA: Diagnosis present

## 2022-12-08 DIAGNOSIS — D631 Anemia in chronic kidney disease: Secondary | ICD-10-CM

## 2022-12-08 DIAGNOSIS — D649 Anemia, unspecified: Secondary | ICD-10-CM | POA: Diagnosis not present

## 2022-12-08 DIAGNOSIS — Z5112 Encounter for antineoplastic immunotherapy: Secondary | ICD-10-CM | POA: Diagnosis present

## 2022-12-08 LAB — CBC WITH DIFFERENTIAL (CANCER CENTER ONLY)
Abs Immature Granulocytes: 0.02 10*3/uL (ref 0.00–0.07)
Basophils Absolute: 0.1 10*3/uL (ref 0.0–0.1)
Basophils Relative: 3 %
Eosinophils Absolute: 0.1 10*3/uL (ref 0.0–0.5)
Eosinophils Relative: 2 %
HCT: 23.9 % — ABNORMAL LOW (ref 39.0–52.0)
Hemoglobin: 7.8 g/dL — ABNORMAL LOW (ref 13.0–17.0)
Immature Granulocytes: 1 %
Lymphocytes Relative: 30 %
Lymphs Abs: 0.9 10*3/uL (ref 0.7–4.0)
MCH: 31.3 pg (ref 26.0–34.0)
MCHC: 32.6 g/dL (ref 30.0–36.0)
MCV: 96 fL (ref 80.0–100.0)
Monocytes Absolute: 0.5 10*3/uL (ref 0.1–1.0)
Monocytes Relative: 16 %
Neutro Abs: 1.4 10*3/uL — ABNORMAL LOW (ref 1.7–7.7)
Neutrophils Relative %: 48 %
Platelet Count: 241 10*3/uL (ref 150–400)
RBC: 2.49 MIL/uL — ABNORMAL LOW (ref 4.22–5.81)
RDW: 17.2 % — ABNORMAL HIGH (ref 11.5–15.5)
WBC Count: 2.9 10*3/uL — ABNORMAL LOW (ref 4.0–10.5)
nRBC: 0 % (ref 0.0–0.2)

## 2022-12-08 LAB — CMP (CANCER CENTER ONLY)
ALT: 16 U/L (ref 0–44)
AST: 19 U/L (ref 15–41)
Albumin: 4 g/dL (ref 3.5–5.0)
Alkaline Phosphatase: 84 U/L (ref 38–126)
Anion gap: 11 (ref 5–15)
BUN: 85 mg/dL — ABNORMAL HIGH (ref 6–20)
CO2: 19 mmol/L — ABNORMAL LOW (ref 22–32)
Calcium: 6.8 mg/dL — ABNORMAL LOW (ref 8.9–10.3)
Chloride: 108 mmol/L (ref 98–111)
Creatinine: 8.77 mg/dL (ref 0.61–1.24)
GFR, Estimated: 6 mL/min — ABNORMAL LOW (ref >60)
Glucose, Bld: 147 mg/dL — ABNORMAL HIGH (ref 70–99)
Potassium: 5 mmol/L (ref 3.5–5.1)
Sodium: 138 mmol/L (ref 135–145)
Total Bilirubin: 0.3 mg/dL (ref 0.3–1.2)
Total Protein: 6.1 g/dL — ABNORMAL LOW (ref 6.5–8.1)

## 2022-12-08 LAB — SAMPLE TO BLOOD BANK

## 2022-12-08 MED ORDER — BORTEZOMIB CHEMO SQ INJECTION 3.5 MG (2.5MG/ML)
1.3000 mg/m2 | Freq: Once | INTRAMUSCULAR | Status: AC
  Administered 2022-12-08: 2.5 mg via SUBCUTANEOUS
  Filled 2022-12-08: qty 1

## 2022-12-08 MED ORDER — SODIUM CHLORIDE 0.9 % IV SOLN
500.0000 mg/m2 | Freq: Once | INTRAVENOUS | Status: AC
  Administered 2022-12-08: 1000 mg via INTRAVENOUS
  Filled 2022-12-08: qty 50

## 2022-12-08 MED ORDER — PALONOSETRON HCL INJECTION 0.25 MG/5ML
0.2500 mg | Freq: Once | INTRAVENOUS | Status: AC
  Administered 2022-12-08: 0.25 mg via INTRAVENOUS
  Filled 2022-12-08: qty 5

## 2022-12-08 MED ORDER — SODIUM CHLORIDE 0.9 % IV SOLN: Freq: Once | INTRAVENOUS | Status: AC

## 2022-12-08 MED ORDER — SODIUM CHLORIDE 0.9 % IV SOLN
40.0000 mg | Freq: Once | INTRAVENOUS | Status: AC
  Administered 2022-12-08: 40 mg via INTRAVENOUS
  Filled 2022-12-08: qty 4

## 2022-12-08 NOTE — Patient Instructions (Signed)
[facility]  Discharge Instructions: Thank you for choosing [facility] to provide your oncology and hematology care.   If you have a lab appointment with the Cancer Center, please go directly to the Cancer Center and check in at the registration area.   Wear comfortable clothing and clothing appropriate for easy access to any Portacath or PICC line.   We strive to give you quality time with your provider. You may need to reschedule your appointment if you arrive late (15 or more minutes).  Arriving late affects you and other patients whose appointments are after yours.  Also, if you miss three or more appointments without notifying the office, you may be dismissed from the clinic at the provider's discretion.      For prescription refill requests, have your pharmacy contact our office and allow 72 hours for refills to be completed.    Today you received the following chemotherapy and/or immunotherapy agents: Velcade, cytoxan   To help prevent nausea and vomiting after your treatment, we encourage you to take your nausea medication as directed.  BELOW ARE SYMPTOMS THAT SHOULD BE REPORTED IMMEDIATELY: *FEVER GREATER THAN 100.4 F (38 C) OR HIGHER *CHILLS OR SWEATING *NAUSEA AND VOMITING THAT IS NOT CONTROLLED WITH YOUR NAUSEA MEDICATION *UNUSUAL SHORTNESS OF BREATH *UNUSUAL BRUISING OR BLEEDING *URINARY PROBLEMS (pain or burning when urinating, or frequent urination) *BOWEL PROBLEMS (unusual diarrhea, constipation, pain near the anus) TENDERNESS IN MOUTH AND THROAT WITH OR WITHOUT PRESENCE OF ULCERS (sore throat, sores in mouth, or a toothache) UNUSUAL RASH, SWELLING OR PAIN  UNUSUAL VAGINAL DISCHARGE OR ITCHING   Items with * indicate a potential emergency and should be followed up as soon as possible or go to the Emergency Department if any problems should occur.  Please show the CHEMOTHERAPY ALERT CARD or IMMUNOTHERAPY ALERT CARD at  check-in to the Emergency Department and triage nurse.  Should you have questions after your visit or need to cancel or reschedule your appointment, please contact [facility] [facility]  Dept: [phone]  and follow the prompts.  Office hours are 8:00 a.m. to 4:30 p.m. Monday - Friday. Please note that voicemails left after 4:00 p.m. may not be returned until the following business day.  We are closed weekends and major holidays. You have access to a nurse at all times for urgent questions. Please call the main number to the clinic Dept: [phone] and follow the prompts.   For any non-urgent questions, you may also contact your provider using MyChart. We now offer e-Visits for anyone 24 and older to request care online for non-urgent symptoms. For details visit [email or web address].   Also download the MyChart app! Go to the app store, search "MyChart", open the app, select [place], and log in with your MyChart username and password.

## 2022-12-08 NOTE — Progress Notes (Unsigned)
CRITICAL VALUE STICKER  CRITICAL VALUE:Creatinine =8.77  RECEIVER (on-site recipient of call):Jill Stopka  DATE & TIME NOTIFIED: 12/08/2022 @ 0930  MESSENGER (representative from lab):MELISSA  MD NOTIFIED:Heilingoetter, PA-C  TIME OF NOTIFICATION:0931  RESPONSE:  Therapeutic plasma exchange  has been required in the past. He follows up closely with nephrology.

## 2022-12-08 NOTE — Progress Notes (Signed)
Per [name], PA- ok to treat today with hemoglobin of 7.8, ANC of 1.4 and Scr of 8.77.   Patient aware of his blood appt. On the 12th. Understands he must have his blue band.

## 2022-12-08 NOTE — Progress Notes (Signed)
Per Dr. Arbutus Ped, cont Cyclophosphamide dose as ordered (500 mg/m2).  Ebony Hail, Pharm.D., CPP 12/08/2022@10 :41 AM

## 2022-12-09 LAB — PREPARE RBC (CROSSMATCH)

## 2022-12-10 ENCOUNTER — Other Ambulatory Visit: Payer: Self-pay

## 2022-12-11 ENCOUNTER — Inpatient Hospital Stay: Payer: BC Managed Care – PPO

## 2022-12-11 ENCOUNTER — Other Ambulatory Visit: Payer: Self-pay

## 2022-12-11 ENCOUNTER — Ambulatory Visit: Payer: BC Managed Care – PPO

## 2022-12-11 VITALS — BP 130/86 | HR 71 | Temp 98.2°F | Resp 16

## 2022-12-11 DIAGNOSIS — Z5112 Encounter for antineoplastic immunotherapy: Secondary | ICD-10-CM | POA: Diagnosis not present

## 2022-12-11 DIAGNOSIS — C9000 Multiple myeloma not having achieved remission: Secondary | ICD-10-CM

## 2022-12-11 DIAGNOSIS — D631 Anemia in chronic kidney disease: Secondary | ICD-10-CM

## 2022-12-11 MED ORDER — SODIUM CHLORIDE 0.9% IV SOLUTION
250.0000 mL | Freq: Once | INTRAVENOUS | Status: AC
  Administered 2022-12-11: 250 mL via INTRAVENOUS

## 2022-12-11 MED ORDER — ACETAMINOPHEN 325 MG PO TABS
650.0000 mg | ORAL_TABLET | Freq: Once | ORAL | Status: AC
  Administered 2022-12-11: 650 mg via ORAL
  Filled 2022-12-11: qty 2

## 2022-12-11 MED ORDER — PROCHLORPERAZINE MALEATE 10 MG PO TABS
10.0000 mg | ORAL_TABLET | Freq: Once | ORAL | Status: AC
  Administered 2022-12-11: 10 mg via ORAL
  Filled 2022-12-11: qty 1

## 2022-12-11 MED ORDER — BORTEZOMIB CHEMO SQ INJECTION 3.5 MG (2.5MG/ML)
1.3000 mg/m2 | Freq: Once | INTRAMUSCULAR | Status: AC
  Administered 2022-12-11: 2.5 mg via SUBCUTANEOUS
  Filled 2022-12-11: qty 1

## 2022-12-11 MED ORDER — DIPHENHYDRAMINE HCL 25 MG PO CAPS
25.0000 mg | ORAL_CAPSULE | Freq: Once | ORAL | Status: AC
  Administered 2022-12-11: 25 mg via ORAL
  Filled 2022-12-11: qty 1

## 2022-12-11 NOTE — Patient Instructions (Signed)
[facility] [facility]  Discharge Instructions: Thank you for choosing [facility] to provide your oncology and hematology care.   If you have a lab appointment with the Cancer Center, please go directly to the Cancer Center and check in at the registration area.   Wear comfortable clothing and clothing appropriate for easy access to any Portacath or PICC line.   We strive to give you quality time with your provider. You may need to reschedule your appointment if you arrive late (15 or more minutes).  Arriving late affects you and other patients whose appointments are after yours.  Also, if you miss three or more appointments without notifying the office, you may be dismissed from the clinic at the provider's discretion.      For prescription refill requests, have your pharmacy contact our office and allow 72 hours for refills to be completed.    Today you received the following chemotherapy and/or immunotherapy agents Velcade      To help prevent nausea and vomiting after your treatment, we encourage you to take your nausea medication as directed.  BELOW ARE SYMPTOMS THAT SHOULD BE REPORTED IMMEDIATELY: *FEVER GREATER THAN 100.4 F (38 C) OR HIGHER *CHILLS OR SWEATING *NAUSEA AND VOMITING THAT IS NOT CONTROLLED WITH YOUR NAUSEA MEDICATION *UNUSUAL SHORTNESS OF BREATH *UNUSUAL BRUISING OR BLEEDING *URINARY PROBLEMS (pain or burning when urinating, or frequent urination) *BOWEL PROBLEMS (unusual diarrhea, constipation, pain near the anus) TENDERNESS IN MOUTH AND THROAT WITH OR WITHOUT PRESENCE OF ULCERS (sore throat, sores in mouth, or a toothache) UNUSUAL RASH, SWELLING OR PAIN  UNUSUAL VAGINAL DISCHARGE OR ITCHING   Items with * indicate a potential emergency and should be followed up as soon as possible or go to the Emergency Department if any problems should occur.  Please show the CHEMOTHERAPY ALERT CARD or IMMUNOTHERAPY ALERT CARD at check-in  to the Emergency Department and triage nurse.  Should you have questions after your visit or need to cancel or reschedule your appointment, please contact [place] [facility]  Dept: [phone]  and follow the prompts.  Office hours are 8:00 a.m. to 4:30 p.m. Monday - Friday. Please note that voicemails left after 4:00 p.m. may not be returned until the following business day.  We are closed weekends and major holidays. You have access to a nurse at all times for urgent questions. Please call the main number to the clinic Dept: [phone] and follow the prompts.   For any non-urgent questions, you may also contact your provider using MyChart. We now offer e-Visits for anyone 38 and older to request care online for non-urgent symptoms. For details visit [email or web address].   Also download the MyChart app! Go to the app store, search "MyChart", open the app, select [place], and log in with your MyChart username and password.  Blood Transfusion, Adult, Care After The following information offers guidance on how to care for yourself after your procedure. Your health care provider may also give you more specific instructions. If you have problems or questions, contact your health care provider. What can I expect after the procedure? After the procedure, it is common to have: Bruising and soreness where the IV was inserted. A headache. Follow these instructions at home: IV insertion site care     Follow instructions from your health care provider about how to take care of your IV insertion site. Make sure you: Wash your hands with soap and water  for at least 20 seconds before and after you change your bandage (dressing). If soap and water are not available, use hand sanitizer. Change your dressing as told by your health care provider. Check your IV insertion site every day for signs of infection. Check for: Redness, swelling, or pain. Bleeding from the  site. Warmth. Pus or a bad smell. General instructions Take over-the-counter and prescription medicines only as told by your health care provider. Rest as told by your health care provider. Return to your normal activities as told by your health care provider. Keep all follow-up visits. Lab tests may need to be done at certain periods to recheck your blood counts. Contact a health care provider if: You have itching or red, swollen areas of skin (hives). You have a fever or chills. You have pain in the head, back, or chest. You feel anxious or you feel weak after doing your normal activities. You have redness, swelling, warmth, or pain around the IV insertion site. You have blood coming from the IV insertion site that does not stop with pressure. You have pus or a bad smell coming from your IV insertion site. If you received your blood transfusion in an outpatient setting, you will be told whom to contact to report any reactions. Get help right away if: You have symptoms of a serious allergic or immune system reaction, including: Trouble breathing or shortness of breath. Swelling of the face, feeling flushed, or widespread rash. Dark urine or blood in the urine. Fast heartbeat. These symptoms may be an emergency. Get help right away. Call 911. Do not wait to see if the symptoms will go away. Do not drive yourself to the hospital. Summary Bruising and soreness around the IV insertion site are common. Check your IV insertion site every day for signs of infection. Rest as told by your health care provider. Return to your normal activities as told by your health care provider. Get help right away for symptoms of a serious allergic or immune system reaction to the blood transfusion. This information is not intended to replace advice given to you by your health care provider. Make sure you discuss any questions you have with your health care provider. Document Revised: 06/14/2021 Document  Reviewed: 06/14/2021 Elsevier Patient Education  2024 [employer].

## 2022-12-12 LAB — TYPE AND SCREEN
ABO/RH(D): AB POS
Antibody Screen: NEGATIVE
Unit division: 0

## 2022-12-12 LAB — BPAM RBC
Blood Product Expiration Date: 202409252359
ISSUE DATE / TIME: 202409121315
Unit Type and Rh: 7300

## 2022-12-15 ENCOUNTER — Inpatient Hospital Stay: Payer: BC Managed Care – PPO

## 2022-12-15 ENCOUNTER — Other Ambulatory Visit: Payer: Self-pay

## 2022-12-15 ENCOUNTER — Encounter: Payer: Self-pay | Admitting: Internal Medicine

## 2022-12-15 VITALS — BP 134/84 | HR 78 | Temp 98.3°F | Resp 16 | Ht 70.0 in | Wt 180.1 lb

## 2022-12-15 DIAGNOSIS — C9000 Multiple myeloma not having achieved remission: Secondary | ICD-10-CM

## 2022-12-15 DIAGNOSIS — Z5112 Encounter for antineoplastic immunotherapy: Secondary | ICD-10-CM | POA: Diagnosis not present

## 2022-12-15 LAB — CBC WITH DIFFERENTIAL (CANCER CENTER ONLY)
Abs Immature Granulocytes: 0.01 10*3/uL (ref 0.00–0.07)
Basophils Absolute: 0.1 10*3/uL (ref 0.0–0.1)
Basophils Relative: 2 %
Eosinophils Absolute: 0.1 10*3/uL (ref 0.0–0.5)
Eosinophils Relative: 2 %
HCT: 26.2 % — ABNORMAL LOW (ref 39.0–52.0)
Hemoglobin: 8.9 g/dL — ABNORMAL LOW (ref 13.0–17.0)
Immature Granulocytes: 0 %
Lymphocytes Relative: 16 %
Lymphs Abs: 0.6 10*3/uL — ABNORMAL LOW (ref 0.7–4.0)
MCH: 32 pg (ref 26.0–34.0)
MCHC: 34 g/dL (ref 30.0–36.0)
MCV: 94.2 fL (ref 80.0–100.0)
Monocytes Absolute: 0.4 10*3/uL (ref 0.1–1.0)
Monocytes Relative: 10 %
Neutro Abs: 2.8 10*3/uL (ref 1.7–7.7)
Neutrophils Relative %: 70 %
Platelet Count: 156 10*3/uL (ref 150–400)
RBC: 2.78 MIL/uL — ABNORMAL LOW (ref 4.22–5.81)
RDW: 16.3 % — ABNORMAL HIGH (ref 11.5–15.5)
WBC Count: 3.9 10*3/uL — ABNORMAL LOW (ref 4.0–10.5)
nRBC: 0 % (ref 0.0–0.2)

## 2022-12-15 LAB — CMP (CANCER CENTER ONLY)
ALT: 14 U/L (ref 0–44)
AST: 20 U/L (ref 15–41)
Albumin: 4.2 g/dL (ref 3.5–5.0)
Alkaline Phosphatase: 102 U/L (ref 38–126)
Anion gap: 10 (ref 5–15)
BUN: 86 mg/dL — ABNORMAL HIGH (ref 6–20)
CO2: 22 mmol/L (ref 22–32)
Calcium: 7.4 mg/dL — ABNORMAL LOW (ref 8.9–10.3)
Chloride: 107 mmol/L (ref 98–111)
Creatinine: 8.36 mg/dL (ref 0.61–1.24)
GFR, Estimated: 7 mL/min — ABNORMAL LOW (ref >60)
Glucose, Bld: 70 mg/dL (ref 70–99)
Potassium: 5.2 mmol/L — ABNORMAL HIGH (ref 3.5–5.1)
Sodium: 139 mmol/L (ref 135–145)
Total Bilirubin: 0.3 mg/dL (ref 0.3–1.2)
Total Protein: 6.3 g/dL — ABNORMAL LOW (ref 6.5–8.1)

## 2022-12-15 LAB — SAMPLE TO BLOOD BANK

## 2022-12-15 MED ORDER — BORTEZOMIB CHEMO SQ INJECTION 3.5 MG (2.5MG/ML)
1.3000 mg/m2 | Freq: Once | INTRAMUSCULAR | Status: AC
  Administered 2022-12-15: 2.5 mg via SUBCUTANEOUS
  Filled 2022-12-15: qty 1

## 2022-12-15 MED ORDER — SODIUM CHLORIDE 0.9 % IV SOLN
500.0000 mg/m2 | Freq: Once | INTRAVENOUS | Status: AC
  Administered 2022-12-15: 1000 mg via INTRAVENOUS
  Filled 2022-12-15: qty 50

## 2022-12-15 MED ORDER — PALONOSETRON HCL INJECTION 0.25 MG/5ML
0.2500 mg | Freq: Once | INTRAVENOUS | Status: AC
  Administered 2022-12-15: 0.25 mg via INTRAVENOUS
  Filled 2022-12-15: qty 5

## 2022-12-15 MED ORDER — SODIUM CHLORIDE 0.9 % IV SOLN
40.0000 mg | Freq: Once | INTRAVENOUS | Status: DC
  Filled 2022-12-15: qty 4

## 2022-12-15 MED ORDER — SODIUM CHLORIDE 0.9 % IV SOLN: Freq: Once | INTRAVENOUS | Status: AC

## 2022-12-15 NOTE — Patient Instructions (Signed)
[place] [facility]  Discharge Instructions: Thank you for choosing [facility] to provide your oncology and hematology care.   If you have a lab appointment with the Cancer Center, please go directly to the Cancer Center and check in at the registration area.   Wear comfortable clothing and clothing appropriate for easy access to any Portacath or PICC line.   We strive to give you quality time with your provider. You may need to reschedule your appointment if you arrive late (15 or more minutes).  Arriving late affects you and other patients whose appointments are after yours.  Also, if you miss three or more appointments without notifying the office, you may be dismissed from the clinic at the provider's discretion.      For prescription refill requests, have your pharmacy contact our office and allow 72 hours for refills to be completed.    Today you received the following chemotherapy and/or immunotherapy agents: Bortezomib (Velcade) and Cytoxan     To help prevent nausea and vomiting after your treatment, we encourage you to take your nausea medication as directed.  BELOW ARE SYMPTOMS THAT SHOULD BE REPORTED IMMEDIATELY: *FEVER GREATER THAN 100.4 F (38 C) OR HIGHER *CHILLS OR SWEATING *NAUSEA AND VOMITING THAT IS NOT CONTROLLED WITH YOUR NAUSEA MEDICATION *UNUSUAL SHORTNESS OF BREATH *UNUSUAL BRUISING OR BLEEDING *URINARY PROBLEMS (pain or burning when urinating, or frequent urination) *BOWEL PROBLEMS (unusual diarrhea, constipation, pain near the anus) TENDERNESS IN MOUTH AND THROAT WITH OR WITHOUT PRESENCE OF ULCERS (sore throat, sores in mouth, or a toothache) UNUSUAL RASH, SWELLING OR PAIN  UNUSUAL VAGINAL DISCHARGE OR ITCHING   Items with * indicate a potential emergency and should be followed up as soon as possible or go to the Emergency Department if any problems should occur.  Please show the CHEMOTHERAPY ALERT CARD or  IMMUNOTHERAPY ALERT CARD at check-in to the Emergency Department and triage nurse.  Should you have questions after your visit or need to cancel or reschedule your appointment, please contact [place] [facility]  Dept: [phone]  and follow the prompts.  Office hours are 8:00 a.m. to 4:30 p.m. Monday - Friday. Please note that voicemails left after 4:00 p.m. may not be returned until the following business day.  We are closed weekends and major holidays. You have access to a nurse at all times for urgent questions. Please call the main number to the clinic Dept: [phone] and follow the prompts.   For any non-urgent questions, you may also contact your provider using MyChart. We now offer e-Visits for anyone 54 and older to request care online for non-urgent symptoms. For details visit [email or web address].   Also download the MyChart app! Go to the app store, search "MyChart", open the app, select , and log in with your MyChart username and password.

## 2022-12-15 NOTE — Progress Notes (Signed)
Pt. creatine 8.36 are we ok for treatment today Day 8 cycle 2 Velcade/cytoxan?  OK to treat per Dr Arbutus Ped

## 2022-12-18 ENCOUNTER — Inpatient Hospital Stay: Payer: BC Managed Care – PPO

## 2022-12-18 VITALS — BP 144/88 | HR 78 | Temp 98.1°F | Resp 16 | Wt 182.5 lb

## 2022-12-18 DIAGNOSIS — C9000 Multiple myeloma not having achieved remission: Secondary | ICD-10-CM

## 2022-12-18 DIAGNOSIS — Z5112 Encounter for antineoplastic immunotherapy: Secondary | ICD-10-CM | POA: Diagnosis not present

## 2022-12-18 MED ORDER — BORTEZOMIB CHEMO SQ INJECTION 3.5 MG (2.5MG/ML)
1.3000 mg/m2 | Freq: Once | INTRAMUSCULAR | Status: AC
  Administered 2022-12-18: 2.5 mg via SUBCUTANEOUS
  Filled 2022-12-18: qty 1

## 2022-12-18 MED ORDER — PROCHLORPERAZINE MALEATE 10 MG PO TABS
10.0000 mg | ORAL_TABLET | Freq: Once | ORAL | Status: AC
  Administered 2022-12-18: 10 mg via ORAL
  Filled 2022-12-18 (×2): qty 1

## 2022-12-18 NOTE — Patient Instructions (Signed)
[place] [facility] AT [facility]   Discharge Instructions: Thank you for choosing [facility] to provide your oncology and hematology care.   If you have a lab appointment with the Cancer Center, please go directly to the Cancer Center and check in at the registration area.   Wear comfortable clothing and clothing appropriate for easy access to any Portacath or PICC line.   We strive to give you quality time with your provider. You may need to reschedule your appointment if you arrive late (15 or more minutes).  Arriving late affects you and other patients whose appointments are after yours.  Also, if you miss three or more appointments without notifying the office, you may be dismissed from the clinic at the provider's discretion.      For prescription refill requests, have your pharmacy contact our office and allow 72 hours for refills to be completed.    Today you received the following chemotherapy and/or immunotherapy agents: Bortezomib (Velcade)      To help prevent nausea and vomiting after your treatment, we encourage you to take your nausea medication as directed.  BELOW ARE SYMPTOMS THAT SHOULD BE REPORTED IMMEDIATELY: *FEVER GREATER THAN 100.4 F (38 C) OR HIGHER *CHILLS OR SWEATING *NAUSEA AND VOMITING THAT IS NOT CONTROLLED WITH YOUR NAUSEA MEDICATION *UNUSUAL SHORTNESS OF BREATH *UNUSUAL BRUISING OR BLEEDING *URINARY PROBLEMS (pain or burning when urinating, or frequent urination) *BOWEL PROBLEMS (unusual diarrhea, constipation, pain near the anus) TENDERNESS IN MOUTH AND THROAT WITH OR WITHOUT PRESENCE OF ULCERS (sore throat, sores in mouth, or a toothache) UNUSUAL RASH, SWELLING OR PAIN  UNUSUAL VAGINAL DISCHARGE OR ITCHING   Items with * indicate a potential emergency and should be followed up as soon as possible or go to the Emergency Department if any problems should occur.  Please show the CHEMOTHERAPY ALERT CARD or IMMUNOTHERAPY ALERT  CARD at check-in to the Emergency Department and triage nurse.  Should you have questions after your visit or need to cancel or reschedule your appointment, please contact [place] [facility]  Dept: [phone]  and follow the prompts.  Office hours are 8:00 a.m. to 4:30 p.m. Monday - Friday. Please note that voicemails left after 4:00 p.m. may not be returned until the following business day.  We are closed weekends and major holidays. You have access to a nurse at all times for urgent questions. Please call the main number to the clinic Dept: [phone] and follow the prompts.   For any non-urgent questions, you may also contact your provider using MyChart. We now offer e-Visits for anyone 40 and older to request care online for non-urgent symptoms. For details visit [email or web address].   Also download the MyChart app! Go to the app store, search "MyChart", open the app, select [place], and log in with your MyChart username and password.

## 2022-12-19 ENCOUNTER — Telehealth: Payer: Self-pay

## 2022-12-19 ENCOUNTER — Inpatient Hospital Stay: Payer: BC Managed Care – PPO | Admitting: Dietician

## 2022-12-19 NOTE — Progress Notes (Signed)
Nutrition Assessment   Reason for Assessment: Hyperkalemia; diet education    ASSESSMENT: 57 year old male with recently diagnosed multiple myeloma with lytic bone lesions (diagnosed 08/24). S/p plasmapheresis x4. Pt is currently receiving velcade, cyclophosphamide + decadron. Pt is under the care of Dr. Shirline Frees.   8/10-8/17 - [facility] admission (rt IF cath placed 8/15)   Met with pt and wife in office. They express feeling overwhelmed with foods he can and cannot have secondary to hyperkalemia. Wife states she has read conflicting information about foods okay to eat. Pt is concerned nephrologist is going to recommend starting dialysis. He would like to avoid this if at all possible. Pt is not currently on potassium binder. He recalls single dose Kayexalate. Pt reports his appetite has been good since hospital discharge. He has gained wt and feeling well. States it is hard to believe he is sick. Yesterday pt had sausage, eggs, toast with light unsalted butter and strawberry jelly. Had pulled pork, baked beans, mac/cheese, salad (romaine with ranch dressing) for dinner. Pt had a couple reese cups last night. He drinks 4-5 bottles of water. Some sprite and [place] green tea. Pt denies nausea, vomiting, diarrhea, constipation.   Nutrition Focused Physical Exam: deferred    Medications: acyclovir, decadron, ferric citrate, compazine, sodium bicarbonate   Labs: 9/16 - Hgb 8.9, K 5.2, Cr 8.36, BUN 86, Ca 7.4   Anthropometrics:   Height: 5'10" Weight: 182 lb 8 oz  UBW: 185-190 lb (per pt) BMI: 26.19    NUTRITION DIAGNOSIS: Food and nutrition related knowledge deficit related to newly diagnosed cancer as evidenced by no prior need for associated nutrition education    INTERVENTION:  Educated on foods high/low in potassium - handouts with list of foods provided Consider daily K binder - in-basket to PA-C (Cassie)  Pt is followed by outpt nephrology - next appt 10/3 with Dr. Diamantina Providence  information provided  Support and encouragement    MONITORING, EVALUATION, GOAL: wt trends, intake, labs   Next Visit: Thursday October 10 during infusion

## 2022-12-19 NOTE — Telephone Encounter (Signed)
I called the pt Nephrologist office and relay the message below. I got the VM . I also included in the message that if they have any questions to get the Cancer Center a call.   Bren Steers. CMA

## 2022-12-19 NOTE — Telephone Encounter (Signed)
-----   Message from Cassandra L Heilingoetter sent at 12/19/2022 10:19 AM EDT ----- Regarding: RE: Hyperkalemia Thanks for reaching out. His potassium is high because his terrible renal function. I have been faxing his nephrologist his labs. We wouldn't start him on that medication but his nephrologist can consider it! I would encourage him to talk to his nephrologist and see if they need to start him on anything for his potassium. We sent him Kayexalate because we do labs so frequently for his treatment and caught his number high enough to warrant taking it to lower it until he can see nephrology. We can relay your suggestion to nephrology. Chell, can you call his nephrologist and just relay Suzanne's suggestion? We would ask that they manage it and prescribe if they move forward with this.   Cassie ----- Message ----- From: Noreene Larsson, RD Sent: 12/19/2022  10:17 AM EDT To: Johnette Abraham Heilingoetter, PA-C Subject: Hyperkalemia                                   Hi Cassie,  I met with pt and wife this morning for education on foods to limit/avoid with hyperkalemia. He reports not currently taking a potassium binder. Pt recalls drinking a bottle of something once and then single dose of the kayexalate. Given levels have remained elevated, would Lokelma be an option for him?   Thanks! Rosalita Chessman

## 2022-12-22 ENCOUNTER — Other Ambulatory Visit: Payer: Self-pay | Admitting: Internal Medicine

## 2022-12-22 DIAGNOSIS — C9000 Multiple myeloma not having achieved remission: Secondary | ICD-10-CM

## 2022-12-23 ENCOUNTER — Encounter: Payer: Self-pay | Admitting: Endocrinology

## 2022-12-25 ENCOUNTER — Other Ambulatory Visit: Payer: Self-pay

## 2022-12-29 ENCOUNTER — Inpatient Hospital Stay: Payer: BC Managed Care – PPO

## 2022-12-29 ENCOUNTER — Inpatient Hospital Stay (HOSPITAL_BASED_OUTPATIENT_CLINIC_OR_DEPARTMENT_OTHER): Payer: BC Managed Care – PPO | Admitting: Internal Medicine

## 2022-12-29 ENCOUNTER — Telehealth: Payer: Self-pay | Admitting: Medical Oncology

## 2022-12-29 ENCOUNTER — Telehealth: Payer: Self-pay

## 2022-12-29 ENCOUNTER — Other Ambulatory Visit: Payer: Self-pay | Admitting: Medical Oncology

## 2022-12-29 VITALS — BP 127/78 | HR 79 | Temp 98.0°F | Resp 18 | Ht 70.0 in | Wt 185.6 lb

## 2022-12-29 DIAGNOSIS — Z5112 Encounter for antineoplastic immunotherapy: Secondary | ICD-10-CM | POA: Diagnosis not present

## 2022-12-29 DIAGNOSIS — C9000 Multiple myeloma not having achieved remission: Secondary | ICD-10-CM

## 2022-12-29 DIAGNOSIS — D631 Anemia in chronic kidney disease: Secondary | ICD-10-CM

## 2022-12-29 LAB — SAMPLE TO BLOOD BANK

## 2022-12-29 LAB — CMP (CANCER CENTER ONLY)
ALT: 14 U/L (ref 0–44)
AST: 21 U/L (ref 15–41)
Albumin: 3.9 g/dL (ref 3.5–5.0)
Alkaline Phosphatase: 107 U/L (ref 38–126)
Anion gap: 12 (ref 5–15)
BUN: 88 mg/dL — ABNORMAL HIGH (ref 6–20)
CO2: 17 mmol/L — ABNORMAL LOW (ref 22–32)
Calcium: 7.3 mg/dL — ABNORMAL LOW (ref 8.9–10.3)
Chloride: 109 mmol/L (ref 98–111)
Creatinine: 9.12 mg/dL (ref 0.61–1.24)
GFR, Estimated: 6 mL/min — ABNORMAL LOW (ref >60)
Glucose, Bld: 114 mg/dL — ABNORMAL HIGH (ref 70–99)
Potassium: 4.7 mmol/L (ref 3.5–5.1)
Sodium: 138 mmol/L (ref 135–145)
Total Bilirubin: 0.3 mg/dL (ref 0.3–1.2)
Total Protein: 5.8 g/dL — ABNORMAL LOW (ref 6.5–8.1)

## 2022-12-29 LAB — CBC WITH DIFFERENTIAL (CANCER CENTER ONLY)
Abs Immature Granulocytes: 0.01 10*3/uL (ref 0.00–0.07)
Basophils Absolute: 0.1 10*3/uL (ref 0.0–0.1)
Basophils Relative: 2 %
Eosinophils Absolute: 0.1 10*3/uL (ref 0.0–0.5)
Eosinophils Relative: 3 %
HCT: 19.8 % — ABNORMAL LOW (ref 39.0–52.0)
Hemoglobin: 6.6 g/dL — CL (ref 13.0–17.0)
Immature Granulocytes: 0 %
Lymphocytes Relative: 17 %
Lymphs Abs: 0.5 10*3/uL — ABNORMAL LOW (ref 0.7–4.0)
MCH: 31.4 pg (ref 26.0–34.0)
MCHC: 33.3 g/dL (ref 30.0–36.0)
MCV: 94.3 fL (ref 80.0–100.0)
Monocytes Absolute: 0.4 10*3/uL (ref 0.1–1.0)
Monocytes Relative: 13 %
Neutro Abs: 1.7 10*3/uL (ref 1.7–7.7)
Neutrophils Relative %: 65 %
Platelet Count: 213 10*3/uL (ref 150–400)
RBC: 2.1 MIL/uL — ABNORMAL LOW (ref 4.22–5.81)
RDW: 15.9 % — ABNORMAL HIGH (ref 11.5–15.5)
WBC Count: 2.7 10*3/uL — ABNORMAL LOW (ref 4.0–10.5)
nRBC: 0 % (ref 0.0–0.2)

## 2022-12-29 LAB — PREPARE RBC (CROSSMATCH)

## 2022-12-29 MED ORDER — ACETAMINOPHEN 325 MG PO TABS
650.0000 mg | ORAL_TABLET | Freq: Once | ORAL | Status: AC
  Administered 2022-12-29: 650 mg via ORAL
  Filled 2022-12-29: qty 2

## 2022-12-29 MED ORDER — DIPHENHYDRAMINE HCL 25 MG PO CAPS
25.0000 mg | ORAL_CAPSULE | Freq: Once | ORAL | Status: AC
  Administered 2022-12-29: 25 mg via ORAL
  Filled 2022-12-29: qty 1

## 2022-12-29 MED ORDER — SODIUM CHLORIDE 0.9% IV SOLUTION
250.0000 mL | Freq: Once | INTRAVENOUS | Status: AC
  Administered 2022-12-29: 250 mL via INTRAVENOUS

## 2022-12-29 NOTE — Progress Notes (Signed)
[facility] [phone]   [phone]  OFFICE PROGRESS NOTE  [name], MD [address] [place] [address]  DIAGNOSIS: Recently diagnosed with multiple myeloma with lytic bone lesions in addition to renal insufficiency and bone marrow biopsy and aspirate consistent with plasma cell neoplasm with 53% plasma cells. This was diagnosed in August 2024.    PRIOR THERAPY:  Plasmapheresis status post ~4 treatments    CURRENT THERAPY: Systemic chemotherapy with subcutaneous Velcade 1.3 Mg/KG on days 1, 4, 8 and 11 every 3 weeks, cyclophosphamide 500 Mg/M2 IV on days 1 and 8 as well as Decadron 40 mg p.o. on weekly basis start with the first dose of Velcade.  He is status post 2 cycles.  Once his renal function improves, Dr. Arbutus Ped will likely recommend changing treatment to standard treatment with daratumumab, Velcade, Revlimid and Decadron for 4 cycles followed by evaluation for autologous stem cell transplant.    INTERVAL HISTORY: Drew Cooper 57 y.o. male returns to the clinic today for follow-up visit accompanied by his wife [name].Discussed the use of AI scribe software for clinical note transcription with the patient, who gave verbal consent to proceed.  History of Present Illness   The patient, Drew Cooper, a 57 year old individual with a recent diagnosis of Multiple Myeloma in August of 2024, was scheduled to start the third cycle of chemotherapy. He reported no significant side effects from the previous treatments, denying any nausea, vomiting, or significant fatigue. He did, however, mention occasional episodes of diarrhea. The patient also reported taking steroids before his appointments, which he believes helps boost his energy levels.  Despite the ongoing treatment, the patient's hemoglobin and hematocrit levels were found to be significantly low, necessitating a blood transfusion.  He showed a preference for [facility] in [place] due to its proximity.       MEDICAL HISTORY: Past Medical History:  Diagnosis Date   Cancer (HCC)     ALLERGIES:  has No Known Allergies.  MEDICATIONS:  Current Outpatient Medications  Medication Sig Dispense Refill   acetaminophen (TYLENOL) 500 MG tablet Take 500 mg by mouth every 6 (six) hours as needed for moderate pain.     acyclovir (ZOVIRAX) 200 MG capsule Take 1 capsule (200 mg total) by mouth daily. 30 capsule 0   dexamethasone (DECADRON) 4 MG tablet Take 10 tablets by mouth once a week, skip 9/6 and take it 9/9 instead with treatment (Patient taking differently: Take 10 tablets by mouth once a week, skip 9/6 and take it 9/9 instead with treatment. PT STATES HE TOOK ON 12/05/22) 40 tablet 4   ferric citrate (AURYXIA) 1 GM 210 MG(Fe) tablet Take 2 tablets (420 mg total) by mouth 3 (three) times daily with meals. 180 tablet 0   lidocaine (LIDODERM) 5 % Place 1 patch onto the skin daily.     prochlorperazine (COMPAZINE) 10 MG tablet Take 1 tablet (10 mg total) by mouth every 6 (six) hours as needed. 30 tablet 2   sodium bicarbonate 650 MG tablet Take 1 tablet (650 mg total) by mouth 2 (two) times daily. 60 tablet 0   No current facility-administered medications for this visit.    SURGICAL HISTORY:  Past Surgical History:  Procedure Laterality Date   IR FLUORO GUIDE CV LINE RIGHT  11/13/2022   IR US GUIDE VASC ACCESS RIGHT  11/13/2022   KNEE SURGERY      REVIEW OF SYSTEMS:  Constitutional:  positive for fatigue Eyes: negative Ears, nose, mouth, throat, and face: negative Respiratory: negative Cardiovascular: negative Gastrointestinal: negative Genitourinary:negative Integument/breast: negative Hematologic/lymphatic: negative Musculoskeletal:negative Neurological: negative Behavioral/Psych: negative Endocrine: negative Allergic/Immunologic: negative   PHYSICAL EXAMINATION: General appearance: alert, cooperative, fatigued, and no  distress Head: Normocephalic, without obvious abnormality, atraumatic Neck: no adenopathy, no JVD, supple, symmetrical, trachea midline, and thyroid not enlarged, symmetric, no tenderness/mass/nodules Lymph nodes: Cervical, supraclavicular, and axillary nodes normal. Resp: clear to auscultation bilaterally Back: symmetric, no curvature. ROM normal. No CVA tenderness. Cardio: regular rate and rhythm, S1, S2 normal, no murmur, click, rub or gallop GI: soft, non-tender; bowel sounds normal; no masses,  no organomegaly Extremities: extremities normal, atraumatic, no cyanosis or edema Neurologic: Alert and oriented X 3, normal strength and tone. Normal symmetric reflexes. Normal coordination and gait  ECOG PERFORMANCE STATUS: 1 - Symptomatic but completely ambulatory  Blood pressure 127/78, pulse 79, temperature 98 F (36.7 C), temperature source Oral, resp. rate 18, height 5\' 10"  (1.778 m), weight 185 lb 9.6 oz (84.2 kg), SpO2 100%.  LABORATORY DATA: Lab Results  Component Value Date   WBC 2.7 (L) 12/29/2022   HGB 6.6 (LL) 12/29/2022   HCT 19.8 (L) 12/29/2022   MCV 94.3 12/29/2022   PLT 213 12/29/2022      Chemistry      Component Value Date/Time   NA 139 12/15/2022 1307   K 5.2 (H) 12/15/2022 1307   CL 107 12/15/2022 1307   CO2 22 12/15/2022 1307   BUN 86 (H) 12/15/2022 1307   CREATININE 8.36 (HH) 12/15/2022 1307      Component Value Date/Time   CALCIUM 7.4 (L) 12/15/2022 1307   ALKPHOS 102 12/15/2022 1307   AST 20 12/15/2022 1307   ALT 14 12/15/2022 1307   BILITOT 0.3 12/15/2022 1307       RADIOGRAPHIC STUDIES: NM PET Image Initial (PI) Whole Body  Result Date: 12/05/2022 CLINICAL DATA:  Initial treatment strategy for multiple myeloma. EXAM: NUCLEAR MEDICINE PET WHOLE BODY TECHNIQUE: 8.62 mCi F-18 FDG was injected intravenously. Full-ring PET imaging was performed from the head to foot after the radiotracer. CT data was obtained and used for attenuation correction and  anatomic localization. Fasting blood glucose: 101 mg/dl COMPARISON:  Abdominopelvic CT 11/08/2022 FINDINGS: Mediastinal blood pool activity: SUV max 2.8 HEAD/ NECK: No hypermetabolic cervical lymph nodes are identified. No suspicious activity identified within the pharyngeal mucosal space. Incidental CT findings: Prominent hypermetabolic activity within the parotid glands bilaterally which may be inflammatory or treatment related. Bilateral carotid atherosclerosis. CHEST: There are no hypermetabolic mediastinal, hilar or axillary lymph nodes. No hypermetabolic pulmonary activity or suspicious nodularity. Incidental CT findings: Right IJ central venous catheter extends to the superior cavoatrial junction. Atherosclerosis of the aorta, great vessels and coronary arteries. Previously demonstrated pleural effusions have resolved. ABDOMEN/PELVIS: There is no hypermetabolic activity within the liver, adrenal glands, spleen or pancreas. There is no hypermetabolic nodal activity in the abdomen or pelvis. Incidental CT findings: Several small nonobstructing right renal calculi are again noted. No evidence of ureteral calculus or hydronephrosis. Aortic and branch vessel atherosclerosis without evidence of aneurysm. SKELETON: No hypermetabolic osseous lesions are identified. The bones are diffusely demineralized, with scattered small lucent lesions throughout the axial and proximal appendicular skeleton which could relate to the patient's multiple myeloma. No pathologic fractures are seen. Incidental CT findings: Mild spondylosis. EXTREMITIES: No hypermetabolic osseous or soft tissue lesions within the extremities. Incidental CT findings: No suspicious lytic lesions or pathologic fractures identified distally within  the extremities. IMPRESSION: 1. No hypermetabolic osseous or soft tissue lesions are identified to suggest active multiple myeloma. 2. The bones are diffusely demineralized with scattered small lucent lesions which  could relate to the patient's multiple myeloma. No pathologic fractures are seen. 3. No suspicious soft tissue findings. 4. Nonobstructing right renal calculi. 5.  Aortic Atherosclerosis (ICD10-I70.0). Electronically Signed   By: Carey Bullocks M.D.   On: 12/05/2022 16:48    ASSESSMENT AND PLAN: This is a very pleasant 57 years old white male with multiple myeloma with lytic bone lesions in addition to renal insufficiency and bone marrow biopsy and aspirate consistent with plasma cell neoplasm with 53% plasma cells. This was diagnosed in August 2024. He is currently undergoing systemic chemotherapy with subcutaneous Velcade 1.3 Mg/KG on days 1, 4, 8 and 11 every 3 weeks, cyclophosphamide 500 Mg/M2 IV on days 1 and 8 as well as Decadron 40 mg p.o. on weekly basis start with the first dose of Velcade.  He is status post 2 cycles.  Once his renal function improved, I would consider the patient for treatment with subcutaneous daratumumab, subcutaneous Velcade, Revlimid and Decadron before consideration of stem cell transplant. Assessment and Plan    Multiple Myeloma Currently on cycle 2 of chemotherapy with minimal side effects reported. Kidney function has not improved as expected, limiting options for alternative chemotherapy regimens. -Delay cycle 3 of chemotherapy by one week due to low hemoglobin and hematocrit. -Refer to [facility] for second opinion regarding management, with a focus on improving kidney function to allow for alternative chemotherapy regimens.  Anemia Hemoglobin 6.6 and hematocrit 19.8, likely secondary to chemotherapy. -Administer two units of blood transfusion today.  Potential initiation of dialysis Discussed with patient and wife, decision to be made in consultation with nephrologist. -Continue to monitor kidney function closely.  Follow-up in one week to potentially resume chemotherapy.     The patient was advised to call immediately if he has any concerning  symptoms in the interval.  The patient voices understanding of current disease status and treatment options and is in agreement with the current care plan.  All questions were answered. The patient knows to call the clinic with any problems, questions or concerns. We can certainly see the patient much sooner if necessary.  The total time spent in the appointment was 30 minutes.  Disclaimer: This note was dictated with voice recognition software. Similar sounding words can inadvertently be transcribed and may not be corrected upon review.

## 2022-12-29 NOTE — Patient Instructions (Signed)
Blood Transfusion, Adult A blood transfusion is a procedure in which you receive blood through an IV tube. You may need this procedure because of: A bleeding disorder. An illness. An injury. A surgery. The blood may come from someone else (a donor). You may also be able to donate blood for yourself before a surgery. The blood given in a transfusion may be made up of different types of cells. You may get: Red blood cells. These carry oxygen to the cells in the body. Platelets. These help your blood to clot. Plasma. This is the liquid part of your blood. It carries proteins and other substances through the body. White blood cells. These help you fight infections. If you have a clotting disorder, you may also get other types of blood products. Depending on the type of blood product, this procedure may take 1-4 hours to complete. Tell your doctor about: Any bleeding problems you have. Any reactions you have had during a blood transfusion in the past. Any allergies you have. All medicines you are taking, including vitamins, herbs, eye drops, creams, and over-the-counter medicines. Any surgeries you have had. Any medical conditions you have. Whether you are pregnant or may be pregnant. What are the risks? Talk with your health care provider about risks. The most common problems include: A mild allergic reaction. This includes red, swollen areas of skin (hives) and itching. Fever or chills. This may be the body's response to new blood cells received. This may happen during or up to 4 hours after the transfusion. More serious problems may include: A serious allergic reaction. This includes breathing trouble or swelling around the face and lips. Too much fluid in the lungs. This may cause breathing problems. Lung injury. This causes breathing trouble and low oxygen in the blood. This can happen within hours of the transfusion or days later. Too much iron. This can happen after getting many blood  transfusions over a period of time. An infection or virus passed through the blood. This is rare. Donated blood is carefully tested before it is given. Your body's defense system (immune system) trying to attack the new blood cells. This is rare. Symptoms may include fever, chills, nausea, low blood pressure, and low back or chest pain. Donated cells attacking healthy tissues. This is rare. What happens before the procedure? You will have a blood test to find out your blood type. The test also finds out what type of blood your body will accept and matches it to the donor type. If you are going to have a planned surgery, you may be able to donate your own blood. This may be done in case you need a transfusion. You will have your temperature, blood pressure, and pulse checked. You may receive medicine to help prevent an allergic reaction. This may be done if you have had a reaction to a transfusion before. This medicine may be given to you by mouth or through an IV tube. What happens during the procedure?  An IV tube will be put into one of your veins. The bag of blood will be attached to your IV tube. Then, the blood will enter through your vein. Your temperature, blood pressure, and pulse will be checked often. This is done to find early signs of a transfusion reaction. Tell your nurse right away if you have any of these symptoms: Shortness of breath or trouble breathing. Chest or back pain. Fever or chills. Red, swollen areas of skin or itching. If you have any signs   or symptoms of a reaction, your transfusion will be stopped. You may also be given medicine. When the transfusion is finished, your IV tube will be taken out. Pressure may be put on the IV site for a few minutes. A bandage (dressing) will be put on the IV site. The procedure may vary among doctors and hospitals. What happens after the procedure? You will be monitored until you leave the hospital or clinic. This includes  checking your temperature, blood pressure, pulse, breathing rate, and blood oxygen level. Your blood may be tested to see how you have responded to the transfusion. You may be warmed with fluids or blankets. This is done to keep the temperature of your body normal. If you have your procedure in an outpatient setting, you will be told whom to contact to report any reactions. Where to find more information Visit the [employer]: redcross.org Summary A blood transfusion is a procedure in which you receive blood through an IV tube. The blood you are given may be made up of different blood cells. You may receive red blood cells, platelets, plasma, or white blood cells. Your temperature, blood pressure, and pulse will be checked often. After the procedure, your blood may be tested to see how you have responded. This information is not intended to replace advice given to you by your health care provider. Make sure you discuss any questions you have with your health care provider. Document Revised: 06/14/2021 Document Reviewed: 06/14/2021 Elsevier Patient Education  2024 [employer] Inc.  

## 2022-12-29 NOTE — Telephone Encounter (Signed)
CRITICAL VALUE STICKER  CRITICAL VALUE: Creatinine 9.12  RECEIVER (on-site recipient of call): Daneil Dolin, LPN  DATE & TIME NOTIFIED: 10:47  12/29/22  MESSENGER (representative from lab):  [name]  MD NOTIFIED: Si Gaul MD  TIME OF NOTIFICATION:10:50  RESPONSE:

## 2022-12-29 NOTE — Telephone Encounter (Signed)
CRITICAL VALUE STICKER  CRITICAL VALUE:Hgb 6.6  RECEIVER (on-site recipient of call):Mohd Clemons  DATE & TIME NOTIFIED: 12/29/22 @ 1005  MESSENGER (representative from lab):MARSHA  MD NOTIFIED: MOHAMED  TIME OF NOTIFICATION:1010  RESPONSE:  BLOOD TRANSFUSION ORDERED

## 2022-12-30 ENCOUNTER — Inpatient Hospital Stay: Payer: BC Managed Care – PPO | Attending: Internal Medicine | Admitting: Licensed Clinical Social Worker

## 2022-12-30 DIAGNOSIS — Z5111 Encounter for antineoplastic chemotherapy: Secondary | ICD-10-CM | POA: Insufficient documentation

## 2022-12-30 DIAGNOSIS — C9000 Multiple myeloma not having achieved remission: Secondary | ICD-10-CM | POA: Insufficient documentation

## 2022-12-30 DIAGNOSIS — D649 Anemia, unspecified: Secondary | ICD-10-CM | POA: Insufficient documentation

## 2022-12-30 DIAGNOSIS — Z5112 Encounter for antineoplastic immunotherapy: Secondary | ICD-10-CM | POA: Insufficient documentation

## 2022-12-30 DIAGNOSIS — R252 Cramp and spasm: Secondary | ICD-10-CM | POA: Insufficient documentation

## 2022-12-30 DIAGNOSIS — N189 Chronic kidney disease, unspecified: Secondary | ICD-10-CM | POA: Insufficient documentation

## 2022-12-30 LAB — TYPE AND SCREEN
ABO/RH(D): AB POS
Antibody Screen: NEGATIVE
Unit division: 0
Unit division: 0

## 2022-12-30 LAB — BPAM RBC
Blood Product Expiration Date: 202410292359
Blood Product Expiration Date: 202410292359
ISSUE DATE / TIME: 202409301147
ISSUE DATE / TIME: 202409301147
Unit Type and Rh: 6200
Unit Type and Rh: 6200

## 2022-12-30 NOTE — Progress Notes (Signed)
[facility] CSW Progress Note  Clinical Child psychotherapist contacted patient by phone to check in.  Pt was present in the infusion room during a code blue.  Pt reports it was a scary event, but overall he feels okay.  Emotional support provided.  Pt also reports he has submitted paperwork for the [facility] but anticipates he will still take several weeks before disability it decided.  CSW emailed pt link to [employer] to apply for a grant to assist w/ finances.  CSW to remain available to provide support throughout duration of treatment as appropriate.      Rachel Moulds, LCSW Clinical Social Worker [facility]

## 2022-12-31 ENCOUNTER — Other Ambulatory Visit: Payer: Self-pay

## 2023-01-01 ENCOUNTER — Ambulatory Visit: Payer: BC Managed Care – PPO

## 2023-01-01 ENCOUNTER — Telehealth: Payer: Self-pay | Admitting: Internal Medicine

## 2023-01-01 NOTE — Telephone Encounter (Signed)
Called patients regarding upcoming October/November appointments, patient is notified.

## 2023-01-02 NOTE — Progress Notes (Unsigned)
[name] OFFICE PROGRESS NOTE  Drew Bender, MD [address] [place] [address]  DIAGNOSIS: Recently diagnosed with multiple myeloma with lytic bone lesions in addition to renal insufficiency and bone marrow biopsy and aspirate consistent with plasma cell neoplasm with 53% plasma cells. This was diagnosed in August 2024.   PRIOR THERAPY: Plasmapheresis status post ~4 treatments   CURRENT THERAPY: First dose of treatment given while inpatient with  subcutaneous Velcade 1.3 Mg/KG on days 1, 4, 8 and 11 every 3 weeks, cyclophosphamide 500 Mg/M2 IV on days 1 and 8 as well as Decadron 40 mg p.o. on weekly basis start with the first dose of Velcade. Once his renal function improves, Dr. Arbutus Ped will likely recommend changing treatment to standard treatment with daratumumab, Velcade, Revlimid and Decadron for 4 cycles followed by evaluation for autologous stem cell transplant. He is here for cycle #3 today.   INTERVAL HISTORY: Drew Cooper 57 y.o. male returns to the clinic today for a follow-up visit accompanied by his wife.  The patient was recently diagnosed with multiple myeloma.  He presented to the hospital in August with severe anemia, marked elevated creatinine, and hypercalcemia.  His bone marrow biopsy and aspirate showed 53% plasma cells he also has multiple pelvic lucencies.   He completed the first two cycles of treatment and has been tolerating it well. He unfortunately continues to have significant renal insuffiencey. He follows closely with nephrology. He was last seen last week on 10/3. He had been struggling with hyperkalemia. They have placed him on a sample of potassium lowering agent.    He was last seen by Dr. Arbutus Ped on 12/29/22.  The patient had inquired about being referred to [facility] for second opinion.  He was referred and has an appointment on  10/15 with Dr.  [name].  His fatigue is stable. He had a blood transfusion last week. He  denies any signs and symptoms of infection including nasal congestion, sore throat, skin infections, or dysuria.  He reports an easy bruising on his extremities, denies any abnormal bleeding such as nose bleeding, gum bleeding, hematemesis, melena, or hematochezia. He takes tylenol if needed and knows to avoid NSAIDs. Denies any peripheral neuropathy but sometimes he gets the sensation of his fingers "locking up". He started calcium supplement. Denies any nausea, vomiting, diarrhea, or constipation.  Denies any fevers, chills, or night sweats.  He denies any cough, chest pain, or shortness of breath.  He is here for evaluation and repeat blood work before undergoing day 1 cycle 3.   MEDICAL HISTORY: Past Medical History:  Diagnosis Date   Cancer (HCC)     ALLERGIES:  has No Known Allergies.  MEDICATIONS:  Current Outpatient Medications  Medication Sig Dispense Refill   acetaminophen (TYLENOL) 500 MG tablet Take 500 mg by mouth every 6 (six) hours as needed for moderate pain.     acyclovir (ZOVIRAX) 200 MG capsule Take 1 capsule (200 mg total) by mouth daily. 30 capsule 0   dexamethasone (DECADRON) 4 MG tablet Take 10 tablets by mouth once a week, skip 9/6 and take it 9/9 instead with treatment (Patient taking differently: Take 10 tablets by mouth once a week, skip 9/6 and take it 9/9 instead with treatment. PT STATES HE TOOK ON 12/05/22) 40 tablet 4   ferric citrate (AURYXIA) 1 GM 210 MG(Fe) tablet Take 2 tablets (420 mg total) by mouth 3 (three) times daily with meals. 180 tablet 0  lidocaine (LIDODERM) 5 % Place 1 patch onto the skin daily.     prochlorperazine (COMPAZINE) 10 MG tablet Take 1 tablet (10 mg total) by mouth every 6 (six) hours as needed. 30 tablet 2   sodium bicarbonate 650 MG tablet Take 1 tablet (650 mg total) by mouth 2 (two) times daily. 60 tablet 0   No current facility-administered medications for this visit.    SURGICAL HISTORY:  Past Surgical History:  Procedure  Laterality Date   IR FLUORO GUIDE CV LINE RIGHT  11/13/2022   IR US GUIDE VASC ACCESS RIGHT  11/13/2022   KNEE SURGERY      REVIEW OF SYSTEMS:   Constitutional: Positive for stable fatigue. Negative for appetite change, chills, fever.  HENT: Negative for mouth sores, nosebleeds, sore throat and trouble swallowing.   Eyes: Negative for eye problems and icterus.  Respiratory: Negative for cough, hemoptysis, shortness of breath and wheezing.   Cardiovascular: Negative for chest pain and leg swelling.  Gastrointestinal: Negative for abdominal pain, constipation, diarrhea, nausea and vomiting.  Genitourinary: Negative for bladder incontinence, difficulty urinating, dysuria, frequency and hematuria.   Musculoskeletal: Positive for stable back pain. Negative for gait problem, neck pain and neck stiffness. Positive for hands "locking up" Skin: Negative for itching and rash.  Neurological: Negative for dizziness, extremity weakness, gait problem, headaches, light-headedness and seizures.  Hematological: Negative for adenopathy. Does not bleed easily. Easy bruising.  Psychiatric/Behavioral: Negative for confusion, depression and sleep disturbance. The patient is not nervous/anxious.    PHYSICAL EXAMINATION:  Blood pressure 134/81, pulse 78, temperature 98.2 F (36.8 C), temperature source Oral, resp. rate 14, height 5\' 10"  (1.778 m), weight 187 lb 6.4 oz (85 kg), SpO2 100%.  ECOG PERFORMANCE STATUS: 1  Physical Exam  Constitutional: Oriented to person, place, and time and well-developed, well-nourished, and in no distress.  HENT:  Head: Normocephalic and atraumatic.  Mouth/Throat: Oropharynx is clear and moist. No oropharyngeal exudate.  Eyes: Conjunctivae are normal. Right eye exhibits no discharge. Left eye exhibits no discharge. No scleral icterus.  Neck: Normal range of motion. Neck supple.  Cardiovascular: Normal rate, regular rhythm, normal heart sounds and intact distal pulses.    Pulmonary/Chest: Effort normal and breath sounds normal. No respiratory distress. No wheezes. No rales.  Abdominal: Soft. Bowel sounds are normal. Exhibits no distension and no mass. There is no tenderness.  Musculoskeletal: Normal range of motion. Exhibits no edema.  Lymphadenopathy:    No cervical adenopathy.  Neurological: Alert and oriented to person, place, and time. Exhibits normal muscle tone. Gait normal. Coordination normal.  Skin: Positive for bruising.  Skin is warm and dry. No rash noted. Not diaphoretic. No erythema. No pallor.  Psychiatric: Mood, memory and judgment normal.  Vitals reviewed.  LABORATORY DATA: Lab Results  Component Value Date   WBC 4.3 01/05/2023   HGB 9.0 (L) 01/05/2023   HCT 27.1 (L) 01/05/2023   MCV 92.2 01/05/2023   PLT 198 01/05/2023      Chemistry      Component Value Date/Time   NA 138 12/29/2022 0934   K 4.7 12/29/2022 0934   CL 109 12/29/2022 0934   CO2 17 (L) 12/29/2022 0934   BUN 88 (H) 12/29/2022 0934   CREATININE 9.12 (HH) 12/29/2022 0934      Component Value Date/Time   CALCIUM 7.3 (L) 12/29/2022 0934   ALKPHOS 107 12/29/2022 0934   AST 21 12/29/2022 0934   ALT 14 12/29/2022 0934   BILITOT 0.3 12/29/2022 0934  RADIOGRAPHIC STUDIES:  No results found.   ASSESSMENT/PLAN:  This is a very pleasant 57 year old Caucasian male diagnosed with multiple myeloma with 53% plasma cells on bone marrow biopsy and aspirate.   The patient is here to establish care on an outpatient basis.  He was recently hospitalized in August 2020 for which showed his diagnosis of multiple myeloma.  While admitted to the hospital he underwent day 1 cycle 1.   He rstarted treatment with subcutaneous Velcade 1.3 Mg/KG on days 1, 4, 8 and 11 every 3 weeks, cyclophosphamide 500 Mg/M2 IV on days 1 and 8 as well as Decadron 40 mg p.o. on weekly basis start with the first dose of Velcade on 11/14/22.  He is status cycle 2 and has tolerated it well thus  far.  The patient was referred to [facility] for second opinion and has an upcoming appointment with Dr. Dr. [name] on 10/15   Labs were reviewed.  He continues to have renal insuffiencey from hie myeloma. Ok to treat today with creatinine 8.60. Recommend that he proceed with cycle #3 today scheduled.  He had some repeat myeloma labs drawn today. We will review these at his next appointment. He should be scheduled for labs and infusion on 10/14 and 10/17. I will reach out to scheduling to ensure that gets scheduled.   He follows closely with Dr. Allena Katz from nephrology.  At his last appointment they discussed reassessing if he needs dialysis after he sees [facility].   The patient was advised to call immediately if he has any concerning symptoms in the interval. The patient voices understanding of current disease status and treatment options and is in agreement with the current care plan. All questions were answered. The patient knows to call the clinic with any problems, questions or concerns. We can certainly see the patient much sooner if necessary        No orders of the defined types were placed in this encounter.    The total time spent in the appointment was 20-29 minutes  Doha Boling L Jarvin Ogren, PA-C 01/05/23

## 2023-01-05 ENCOUNTER — Inpatient Hospital Stay: Payer: BC Managed Care – PPO

## 2023-01-05 ENCOUNTER — Other Ambulatory Visit: Payer: BC Managed Care – PPO

## 2023-01-05 ENCOUNTER — Ambulatory Visit: Payer: BC Managed Care – PPO

## 2023-01-05 ENCOUNTER — Inpatient Hospital Stay (HOSPITAL_BASED_OUTPATIENT_CLINIC_OR_DEPARTMENT_OTHER): Payer: BC Managed Care – PPO | Admitting: Physician Assistant

## 2023-01-05 ENCOUNTER — Encounter: Payer: Self-pay | Admitting: Medical Oncology

## 2023-01-05 DIAGNOSIS — C9000 Multiple myeloma not having achieved remission: Secondary | ICD-10-CM

## 2023-01-05 DIAGNOSIS — N189 Chronic kidney disease, unspecified: Secondary | ICD-10-CM | POA: Diagnosis not present

## 2023-01-05 DIAGNOSIS — Z5111 Encounter for antineoplastic chemotherapy: Secondary | ICD-10-CM | POA: Diagnosis present

## 2023-01-05 DIAGNOSIS — Z5112 Encounter for antineoplastic immunotherapy: Secondary | ICD-10-CM | POA: Diagnosis present

## 2023-01-05 DIAGNOSIS — R252 Cramp and spasm: Secondary | ICD-10-CM | POA: Diagnosis not present

## 2023-01-05 DIAGNOSIS — D649 Anemia, unspecified: Secondary | ICD-10-CM | POA: Diagnosis not present

## 2023-01-05 LAB — CBC WITH DIFFERENTIAL (CANCER CENTER ONLY)
Abs Immature Granulocytes: 0.03 10*3/uL (ref 0.00–0.07)
Basophils Absolute: 0.1 10*3/uL (ref 0.0–0.1)
Basophils Relative: 2 %
Eosinophils Absolute: 0.1 10*3/uL (ref 0.0–0.5)
Eosinophils Relative: 2 %
HCT: 27.1 % — ABNORMAL LOW (ref 39.0–52.0)
Hemoglobin: 9 g/dL — ABNORMAL LOW (ref 13.0–17.0)
Immature Granulocytes: 1 %
Lymphocytes Relative: 17 %
Lymphs Abs: 0.7 10*3/uL (ref 0.7–4.0)
MCH: 30.6 pg (ref 26.0–34.0)
MCHC: 33.2 g/dL (ref 30.0–36.0)
MCV: 92.2 fL (ref 80.0–100.0)
Monocytes Absolute: 0.6 10*3/uL (ref 0.1–1.0)
Monocytes Relative: 14 %
Neutro Abs: 2.8 10*3/uL (ref 1.7–7.7)
Neutrophils Relative %: 64 %
Platelet Count: 198 10*3/uL (ref 150–400)
RBC: 2.94 MIL/uL — ABNORMAL LOW (ref 4.22–5.81)
RDW: 15.8 % — ABNORMAL HIGH (ref 11.5–15.5)
WBC Count: 4.3 10*3/uL (ref 4.0–10.5)
nRBC: 0 % (ref 0.0–0.2)

## 2023-01-05 LAB — CMP (CANCER CENTER ONLY)
ALT: 9 U/L (ref 0–44)
AST: 13 U/L — ABNORMAL LOW (ref 15–41)
Albumin: 3.9 g/dL (ref 3.5–5.0)
Alkaline Phosphatase: 115 U/L (ref 38–126)
Anion gap: 13 (ref 5–15)
BUN: 80 mg/dL — ABNORMAL HIGH (ref 6–20)
CO2: 20 mmol/L — ABNORMAL LOW (ref 22–32)
Calcium: 7.4 mg/dL — ABNORMAL LOW (ref 8.9–10.3)
Chloride: 106 mmol/L (ref 98–111)
Creatinine: 8.6 mg/dL (ref 0.61–1.24)
GFR, Estimated: 7 mL/min — ABNORMAL LOW (ref >60)
Glucose, Bld: 98 mg/dL (ref 70–99)
Potassium: 4.3 mmol/L (ref 3.5–5.1)
Sodium: 139 mmol/L (ref 135–145)
Total Bilirubin: 0.3 mg/dL (ref 0.3–1.2)
Total Protein: 6 g/dL — ABNORMAL LOW (ref 6.5–8.1)

## 2023-01-05 LAB — SAMPLE TO BLOOD BANK

## 2023-01-05 MED ORDER — SODIUM CHLORIDE 0.9 % IV SOLN: Freq: Once | INTRAVENOUS | Status: AC

## 2023-01-05 MED ORDER — SODIUM CHLORIDE 0.9% FLUSH: 10.0000 mL | INTRAVENOUS | Status: DC | PRN

## 2023-01-05 MED ORDER — PALONOSETRON HCL INJECTION 0.25 MG/5ML
0.2500 mg | Freq: Once | INTRAVENOUS | Status: AC
  Administered 2023-01-05: 0.25 mg via INTRAVENOUS
  Filled 2023-01-05: qty 5

## 2023-01-05 MED ORDER — BORTEZOMIB CHEMO SQ INJECTION 3.5 MG (2.5MG/ML)
1.3000 mg/m2 | Freq: Once | INTRAMUSCULAR | Status: AC
  Administered 2023-01-05: 2.5 mg via SUBCUTANEOUS
  Filled 2023-01-05: qty 1

## 2023-01-05 MED ORDER — SODIUM CHLORIDE 0.9 % IV SOLN
500.0000 mg/m2 | Freq: Once | INTRAVENOUS | Status: AC
  Administered 2023-01-05: 1000 mg via INTRAVENOUS
  Filled 2023-01-05: qty 50

## 2023-01-05 MED ORDER — HEPARIN SOD (PORK) LOCK FLUSH 100 UNIT/ML IV SOLN: 500.0000 [IU] | Freq: Once | INTRAVENOUS | Status: DC | PRN

## 2023-01-05 NOTE — Progress Notes (Signed)
CRITICAL VALUE STICKER  CRITICAL VALUE:Creatinine=8.60  RECEIVER (on-site recipient of call):Cedric Mcclaine  DATE & TIME NOTIFIED: 01/05/23 @ 1354 MESSENGER (representative from lab):Melissa  MD NOTIFIED: Heilingoetter  TIME OF NOTIFICATION:01/05/23 @ 1427  RESPONSE:  Pt seen and getting treatment

## 2023-01-05 NOTE — Progress Notes (Signed)
Per Cassie PA, ok to treat today with SCR 8.6

## 2023-01-05 NOTE — Patient Instructions (Signed)
[facility]  Discharge Instructions: Thank you for choosing [facility] to provide your oncology and hematology care.   If you have a lab appointment with the Cancer Center, please go directly to the Cancer Center and check in at the registration area.   Wear comfortable clothing and clothing appropriate for easy access to any Portacath or PICC line.   We strive to give you quality time with your provider. You may need to reschedule your appointment if you arrive late (15 or more minutes).  Arriving late affects you and other patients whose appointments are after yours.  Also, if you miss three or more appointments without notifying the office, you may be dismissed from the clinic at the provider's discretion.      For prescription refill requests, have your pharmacy contact our office and allow 72 hours for refills to be completed.    Today you received the following chemotherapy and/or immunotherapy agents: Velcade, Cytoxan.       To help prevent nausea and vomiting after your treatment, we encourage you to take your nausea medication as directed.  BELOW ARE SYMPTOMS THAT SHOULD BE REPORTED IMMEDIATELY: *FEVER GREATER THAN 100.4 F (38 C) OR HIGHER *CHILLS OR SWEATING *NAUSEA AND VOMITING THAT IS NOT CONTROLLED WITH YOUR NAUSEA MEDICATION *UNUSUAL SHORTNESS OF BREATH *UNUSUAL BRUISING OR BLEEDING *URINARY PROBLEMS (pain or burning when urinating, or frequent urination) *BOWEL PROBLEMS (unusual diarrhea, constipation, pain near the anus) TENDERNESS IN MOUTH AND THROAT WITH OR WITHOUT PRESENCE OF ULCERS (sore throat, sores in mouth, or a toothache) UNUSUAL RASH, SWELLING OR PAIN  UNUSUAL VAGINAL DISCHARGE OR ITCHING   Items with * indicate a potential emergency and should be followed up as soon as possible or go to the Emergency Department if any problems should occur.  Please show the CHEMOTHERAPY ALERT CARD or IMMUNOTHERAPY ALERT CARD  at check-in to the Emergency Department and triage nurse.  Should you have questions after your visit or need to cancel or reschedule your appointment, please contact [facility]  Dept: [phone]  and follow the prompts.  Office hours are 8:00 a.m. to 4:30 p.m. Monday - Friday. Please note that voicemails left after 4:00 p.m. may not be returned until the following business day.  We are closed weekends and major holidays. You have access to a nurse at all times for urgent questions. Please call the main number to the clinic Dept: [phone] and follow the prompts.   For any non-urgent questions, you may also contact your provider using MyChart. We now offer e-Visits for anyone 18 and older to request care online for non-urgent symptoms. For details visit [email or web address].   Also download the MyChart app! Go to the app store, search "MyChart", open the app, select [facility], and log in with your MyChart username and password.   

## 2023-01-06 ENCOUNTER — Other Ambulatory Visit: Payer: Self-pay

## 2023-01-06 LAB — IGG, IGA, IGM
IgA: 72 mg/dL — ABNORMAL LOW (ref 90–386)
IgG (Immunoglobin G), Serum: 233 mg/dL — ABNORMAL LOW (ref 603–1613)
IgM (Immunoglobulin M), Srm: 14 mg/dL — ABNORMAL LOW (ref 20–172)

## 2023-01-06 LAB — BETA 2 MICROGLOBULIN, SERUM: Beta-2 Microglobulin: 10 mg/L — ABNORMAL HIGH (ref 0.6–2.4)

## 2023-01-07 ENCOUNTER — Ambulatory Visit: Payer: BC Managed Care – PPO | Admitting: Physician Assistant

## 2023-01-07 ENCOUNTER — Ambulatory Visit: Payer: BC Managed Care – PPO

## 2023-01-07 ENCOUNTER — Other Ambulatory Visit: Payer: BC Managed Care – PPO

## 2023-01-08 ENCOUNTER — Inpatient Hospital Stay: Payer: BC Managed Care – PPO

## 2023-01-08 ENCOUNTER — Inpatient Hospital Stay: Payer: BC Managed Care – PPO | Admitting: Dietician

## 2023-01-08 VITALS — BP 166/89 | HR 81 | Temp 98.2°F | Resp 14 | Ht 70.0 in | Wt 190.0 lb

## 2023-01-08 DIAGNOSIS — C9000 Multiple myeloma not having achieved remission: Secondary | ICD-10-CM

## 2023-01-08 DIAGNOSIS — Z5112 Encounter for antineoplastic immunotherapy: Secondary | ICD-10-CM | POA: Diagnosis not present

## 2023-01-08 LAB — KAPPA/LAMBDA LIGHT CHAINS
Kappa free light chain: 39.2 mg/L — ABNORMAL HIGH (ref 3.3–19.4)
Kappa, lambda light chain ratio: 0.01 — ABNORMAL LOW (ref 0.26–1.65)
Lambda free light chains: 3320.1 mg/L — ABNORMAL HIGH (ref 5.7–26.3)

## 2023-01-08 MED ORDER — PROCHLORPERAZINE MALEATE 10 MG PO TABS
10.0000 mg | ORAL_TABLET | Freq: Once | ORAL | Status: AC
  Administered 2023-01-08: 10 mg via ORAL
  Filled 2023-01-08: qty 1

## 2023-01-08 MED ORDER — BORTEZOMIB CHEMO SQ INJECTION 3.5 MG (2.5MG/ML)
1.3000 mg/m2 | Freq: Once | INTRAMUSCULAR | Status: AC
  Administered 2023-01-08: 2.5 mg via SUBCUTANEOUS
  Filled 2023-01-08: qty 1

## 2023-01-08 NOTE — Progress Notes (Signed)
Nutrition Follow-up:  Pt with multiple myeloma with lytic bone lesions (diagnosed 08/24). S/p plasmapheresis x4. Pt is currently receiving velcade, cyclophosphamide + decadron.   Met with pt and wife in infusion. He reports good appetite and eating well. Pt is mindful of potassium intake. Wife reports they were unable to afford potassium binder. They are awaiting medicaid to be finalized. Wife is tearful sharing they have no money coming in and bills are piling up. Patient has tried working some, however this wipes him out for the following few days.    Medications: reviewed   Labs: 10/7 - BUN 80, Cr 8.60, Ca 7.4 (albumin WNL), K (WNL)  Anthropometrics: Wt 190 lb today increased   9/30 - 185 lb 8 oz 9/16 - 180 lb 1.9 oz    NUTRITION DIAGNOSIS: Food and nutrition related knowledge deficit - improving     INTERVENTION:  Continue low potassium diet - followed closely by outpt nephrology Provided one food bag + one bag of toiletries from [employer] and encouragement     MONITORING, EVALUATION, GOAL: wt trends, intake   NEXT VISIT: Thursday November 7 in infusion with Britta Mccreedy

## 2023-01-08 NOTE — Patient Instructions (Signed)
[facility]   Discharge Instructions: Thank you for choosing [facility] to provide your oncology and hematology care.   If you have a lab appointment with the [facility], please go directly to the [facility] and check in at the registration area.   Wear comfortable clothing and clothing appropriate for easy access to any Portacath or PICC line.   We strive to give you quality time with your provider. You may need to reschedule your appointment if you arrive late (15 or more minutes).  Arriving late affects you and other patients whose appointments are after yours.  Also, if you miss three or more appointments without notifying the office, you may be dismissed from the clinic at the provider's discretion.      For prescription refill requests, have your pharmacy contact our office and allow 72 hours for refills to be completed.    Today you received the following chemotherapy and/or immunotherapy agents: Bortezomib (Velcade)      To help prevent nausea and vomiting after your treatment, we encourage you to take your nausea medication as directed.  BELOW ARE SYMPTOMS THAT SHOULD BE REPORTED IMMEDIATELY: *FEVER GREATER THAN 100.4 F (38 C) OR HIGHER *CHILLS OR SWEATING *NAUSEA AND VOMITING THAT IS NOT CONTROLLED WITH YOUR NAUSEA MEDICATION *UNUSUAL SHORTNESS OF BREATH *UNUSUAL BRUISING OR BLEEDING *URINARY PROBLEMS (pain or burning when urinating, or frequent urination) *BOWEL PROBLEMS (unusual diarrhea, constipation, pain near the anus) TENDERNESS IN MOUTH AND THROAT WITH OR WITHOUT PRESENCE OF ULCERS (sore throat, sores in mouth, or a toothache) UNUSUAL RASH, SWELLING OR PAIN  UNUSUAL VAGINAL DISCHARGE OR ITCHING   Items with * indicate a potential emergency and should be followed up as soon as possible or go to the Emergency Department if any problems should occur.  Please show the CHEMOTHERAPY ALERT CARD or IMMUNOTHERAPY ALERT  CARD at check-in to the Emergency Department and triage nurse.  Should you have questions after your visit or need to cancel or reschedule your appointment, please contact [facility]  Dept: [phone]  and follow the prompts.  Office hours are 8:00 a.m. to 4:30 p.m. Monday - Friday. Please note that voicemails left after 4:00 p.m. may not be returned until the following business day.  We are closed weekends and major holidays. You have access to a nurse at all times for urgent questions. Please call the main number to the clinic Dept: [phone] and follow the prompts.   For any non-urgent questions, you may also contact your provider using MyChart. We now offer e-Visits for anyone 33 and older to request care online for non-urgent symptoms. For details visit [email or web address].   Also download the MyChart app! Go to the app store, search "MyChart", open the app, select [facility], and log in with your MyChart username and password.

## 2023-01-11 ENCOUNTER — Other Ambulatory Visit: Payer: Self-pay

## 2023-01-12 ENCOUNTER — Encounter: Payer: Self-pay | Admitting: Internal Medicine

## 2023-01-12 ENCOUNTER — Inpatient Hospital Stay: Payer: BC Managed Care – PPO

## 2023-01-12 ENCOUNTER — Telehealth: Payer: Self-pay | Admitting: *Deleted

## 2023-01-12 ENCOUNTER — Encounter: Payer: Self-pay | Admitting: Medical Oncology

## 2023-01-12 VITALS — BP 151/92 | HR 73 | Temp 98.0°F | Resp 15 | Wt 188.5 lb

## 2023-01-12 DIAGNOSIS — C9000 Multiple myeloma not having achieved remission: Secondary | ICD-10-CM

## 2023-01-12 DIAGNOSIS — Z5112 Encounter for antineoplastic immunotherapy: Secondary | ICD-10-CM | POA: Diagnosis not present

## 2023-01-12 LAB — CBC WITH DIFFERENTIAL (CANCER CENTER ONLY)
Abs Immature Granulocytes: 0.01 10*3/uL (ref 0.00–0.07)
Basophils Absolute: 0.1 10*3/uL (ref 0.0–0.1)
Basophils Relative: 2 %
Eosinophils Absolute: 0.1 10*3/uL (ref 0.0–0.5)
Eosinophils Relative: 1 %
HCT: 25.4 % — ABNORMAL LOW (ref 39.0–52.0)
Hemoglobin: 8.4 g/dL — ABNORMAL LOW (ref 13.0–17.0)
Immature Granulocytes: 0 %
Lymphocytes Relative: 10 %
Lymphs Abs: 0.5 10*3/uL — ABNORMAL LOW (ref 0.7–4.0)
MCH: 30.3 pg (ref 26.0–34.0)
MCHC: 33.1 g/dL (ref 30.0–36.0)
MCV: 91.7 fL (ref 80.0–100.0)
Monocytes Absolute: 0.4 10*3/uL (ref 0.1–1.0)
Monocytes Relative: 8 %
Neutro Abs: 4.4 10*3/uL (ref 1.7–7.7)
Neutrophils Relative %: 79 %
Platelet Count: 118 10*3/uL — ABNORMAL LOW (ref 150–400)
RBC: 2.77 MIL/uL — ABNORMAL LOW (ref 4.22–5.81)
RDW: 15.2 % (ref 11.5–15.5)
WBC Count: 5.6 10*3/uL (ref 4.0–10.5)
nRBC: 0 % (ref 0.0–0.2)

## 2023-01-12 LAB — CMP (CANCER CENTER ONLY)
ALT: 11 U/L (ref 0–44)
AST: 14 U/L — ABNORMAL LOW (ref 15–41)
Albumin: 3.9 g/dL (ref 3.5–5.0)
Alkaline Phosphatase: 115 U/L (ref 38–126)
Anion gap: 10 (ref 5–15)
BUN: 95 mg/dL — ABNORMAL HIGH (ref 6–20)
CO2: 20 mmol/L — ABNORMAL LOW (ref 22–32)
Calcium: 7.5 mg/dL — ABNORMAL LOW (ref 8.9–10.3)
Chloride: 106 mmol/L (ref 98–111)
Creatinine: 8.66 mg/dL (ref 0.61–1.24)
GFR, Estimated: 7 mL/min — ABNORMAL LOW (ref >60)
Glucose, Bld: 78 mg/dL (ref 70–99)
Potassium: 4.8 mmol/L (ref 3.5–5.1)
Sodium: 136 mmol/L (ref 135–145)
Total Bilirubin: 0.4 mg/dL (ref 0.3–1.2)
Total Protein: 6 g/dL — ABNORMAL LOW (ref 6.5–8.1)

## 2023-01-12 LAB — SAMPLE TO BLOOD BANK

## 2023-01-12 MED ORDER — PALONOSETRON HCL INJECTION 0.25 MG/5ML
0.2500 mg | Freq: Once | INTRAVENOUS | Status: AC
  Administered 2023-01-12: 0.25 mg via INTRAVENOUS
  Filled 2023-01-12: qty 5

## 2023-01-12 MED ORDER — BORTEZOMIB CHEMO SQ INJECTION 3.5 MG (2.5MG/ML)
1.3000 mg/m2 | Freq: Once | INTRAMUSCULAR | Status: AC
  Administered 2023-01-12: 2.5 mg via SUBCUTANEOUS
  Filled 2023-01-12: qty 1

## 2023-01-12 MED ORDER — SODIUM CHLORIDE 0.9 % IV SOLN: Freq: Once | INTRAVENOUS | Status: AC

## 2023-01-12 MED ORDER — DEXAMETHASONE 4 MG PO TABS
40.0000 mg | ORAL_TABLET | Freq: Once | ORAL | Status: AC
  Administered 2023-01-12: 40 mg via ORAL
  Filled 2023-01-12: qty 10

## 2023-01-12 MED ORDER — SODIUM CHLORIDE 0.9 % IV SOLN
500.0000 mg/m2 | Freq: Once | INTRAVENOUS | Status: AC
  Administered 2023-01-12: 1000 mg via INTRAVENOUS
  Filled 2023-01-12: qty 50

## 2023-01-12 NOTE — Telephone Encounter (Signed)
CRITICAL VALUE STICKER  CRITICAL VALUE: Crt 8.66  RECEIVER (on-site recipient of call): Elmyra Ricks, RN  DATE & TIME NOTIFIED: 01/12/2023 @ 2:02  MESSENGER (representative from lab): Melvia Heaps  MD NOTIFIED: Arbutus Ped  TIME OF NOTIFICATION:2:02 pm  RESPONSE:  Chronic elevated creatine/ckd/will advise if changes need to be made.

## 2023-01-12 NOTE — Progress Notes (Signed)
  Labs reviewed: and are not all within treatment parameters. Per Dr. Arbutus Ped , it is ok to treat pt today with  Cytoxan and velcade and creatinine =8.66

## 2023-01-12 NOTE — Progress Notes (Signed)
Entered Dex 40mg  po as requested. Patient didn't take at home. Patient will not take additional Dex doses at home.  Anola Gurney [place], [place], BCPS, BCOP 01/12/2023 2:40 PM

## 2023-01-12 NOTE — Patient Instructions (Signed)
[facility]  Discharge Instructions: Thank you for choosing [facility] to provide your oncology and hematology care.   If you have a lab appointment with the Cancer Center, please go directly to the Cancer Center and check in at the registration area.   Wear comfortable clothing and clothing appropriate for easy access to any Portacath or PICC line.   We strive to give you quality time with your provider. You may need to reschedule your appointment if you arrive late (15 or more minutes).  Arriving late affects you and other patients whose appointments are after yours.  Also, if you miss three or more appointments without notifying the office, you may be dismissed from the clinic at the provider's discretion.      For prescription refill requests, have your pharmacy contact our office and allow 72 hours for refills to be completed.    Today you received the following chemotherapy and/or immunotherapy agents: Velcade, Cytoxan.       To help prevent nausea and vomiting after your treatment, we encourage you to take your nausea medication as directed.  BELOW ARE SYMPTOMS THAT SHOULD BE REPORTED IMMEDIATELY: *FEVER GREATER THAN 100.4 F (38 C) OR HIGHER *CHILLS OR SWEATING *NAUSEA AND VOMITING THAT IS NOT CONTROLLED WITH YOUR NAUSEA MEDICATION *UNUSUAL SHORTNESS OF BREATH *UNUSUAL BRUISING OR BLEEDING *URINARY PROBLEMS (pain or burning when urinating, or frequent urination) *BOWEL PROBLEMS (unusual diarrhea, constipation, pain near the anus) TENDERNESS IN MOUTH AND THROAT WITH OR WITHOUT PRESENCE OF ULCERS (sore throat, sores in mouth, or a toothache) UNUSUAL RASH, SWELLING OR PAIN  UNUSUAL VAGINAL DISCHARGE OR ITCHING   Items with * indicate a potential emergency and should be followed up as soon as possible or go to the Emergency Department if any problems should occur.  Please show the CHEMOTHERAPY ALERT CARD or IMMUNOTHERAPY ALERT CARD  at check-in to the Emergency Department and triage nurse.  Should you have questions after your visit or need to cancel or reschedule your appointment, please contact [facility]  Dept: [phone]  and follow the prompts.  Office hours are 8:00 a.m. to 4:30 p.m. Monday - Friday. Please note that voicemails left after 4:00 p.m. may not be returned until the following business day.  We are closed weekends and major holidays. You have access to a nurse at all times for urgent questions. Please call the main number to the clinic Dept: [phone] and follow the prompts.   For any non-urgent questions, you may also contact your provider using MyChart. We now offer e-Visits for anyone 68 and older to request care online for non-urgent symptoms. For details visit [email or web address].   Also download the MyChart app! Go to the app store, search "MyChart", open the app, select [place], and log in with your MyChart username and password.

## 2023-01-15 ENCOUNTER — Inpatient Hospital Stay: Payer: BC Managed Care – PPO

## 2023-01-15 VITALS — BP 150/86 | HR 80 | Temp 98.0°F | Resp 17 | Wt 186.5 lb

## 2023-01-15 DIAGNOSIS — C9000 Multiple myeloma not having achieved remission: Secondary | ICD-10-CM

## 2023-01-15 DIAGNOSIS — Z5112 Encounter for antineoplastic immunotherapy: Secondary | ICD-10-CM | POA: Diagnosis not present

## 2023-01-15 MED ORDER — PROCHLORPERAZINE MALEATE 10 MG PO TABS
10.0000 mg | ORAL_TABLET | Freq: Once | ORAL | Status: AC
  Administered 2023-01-15: 10 mg via ORAL
  Filled 2023-01-15: qty 1

## 2023-01-15 MED ORDER — BORTEZOMIB CHEMO SQ INJECTION 3.5 MG (2.5MG/ML)
1.3000 mg/m2 | Freq: Once | INTRAMUSCULAR | Status: AC
  Administered 2023-01-15: 2.5 mg via SUBCUTANEOUS
  Filled 2023-01-15: qty 1

## 2023-01-15 NOTE — Patient Instructions (Signed)
[facility]  Discharge Instructions: Thank you for choosing [facility] to provide your oncology and hematology care.   If you have a lab appointment with the Cancer Center, please go directly to the Cancer Center and check in at the registration area.   Wear comfortable clothing and clothing appropriate for easy access to any Portacath or PICC line.   We strive to give you quality time with your provider. You may need to reschedule your appointment if you arrive late (15 or more minutes).  Arriving late affects you and other patients whose appointments are after yours.  Also, if you miss three or more appointments without notifying the office, you may be dismissed from the clinic at the provider's discretion.      For prescription refill requests, have your pharmacy contact our office and allow 72 hours for refills to be completed.    Today you received the following chemotherapy and/or immunotherapy agents: Velcade     To help prevent nausea and vomiting after your treatment, we encourage you to take your nausea medication as directed.  BELOW ARE SYMPTOMS THAT SHOULD BE REPORTED IMMEDIATELY: *FEVER GREATER THAN 100.4 F (38 C) OR HIGHER *CHILLS OR SWEATING *NAUSEA AND VOMITING THAT IS NOT CONTROLLED WITH YOUR NAUSEA MEDICATION *UNUSUAL SHORTNESS OF BREATH *UNUSUAL BRUISING OR BLEEDING *URINARY PROBLEMS (pain or burning when urinating, or frequent urination) *BOWEL PROBLEMS (unusual diarrhea, constipation, pain near the anus) TENDERNESS IN MOUTH AND THROAT WITH OR WITHOUT PRESENCE OF ULCERS (sore throat, sores in mouth, or a toothache) UNUSUAL RASH, SWELLING OR PAIN  UNUSUAL VAGINAL DISCHARGE OR ITCHING   Items with * indicate a potential emergency and should be followed up as soon as possible or go to the Emergency Department if any problems should occur.  Please show the CHEMOTHERAPY ALERT CARD or IMMUNOTHERAPY ALERT CARD at check-in  to the Emergency Department and triage nurse.  Should you have questions after your visit or need to cancel or reschedule your appointment, please contact [facility]  Dept: [phone]  and follow the prompts.  Office hours are 8:00 a.m. to 4:30 p.m. Monday - Friday. Please note that voicemails left after 4:00 p.m. may not be returned until the following business day.  We are closed weekends and major holidays. You have access to a nurse at all times for urgent questions. Please call the main number to the clinic Dept: [phone] and follow the prompts.   For any non-urgent questions, you may also contact your provider using MyChart. We now offer e-Visits for anyone 34 and older to request care online for non-urgent symptoms. For details visit [email or web address].   Also download the MyChart app! Go to the app store, search "MyChart", open the app, select [facility] [place], and log in with your MyChart username and password.

## 2023-01-19 ENCOUNTER — Ambulatory Visit: Payer: BC Managed Care – PPO | Admitting: Internal Medicine

## 2023-01-19 ENCOUNTER — Ambulatory Visit: Payer: BC Managed Care – PPO

## 2023-01-19 ENCOUNTER — Other Ambulatory Visit: Payer: BC Managed Care – PPO

## 2023-01-25 DIAGNOSIS — Z5112 Encounter for antineoplastic immunotherapy: Secondary | ICD-10-CM | POA: Diagnosis present

## 2023-01-25 DIAGNOSIS — N189 Chronic kidney disease, unspecified: Secondary | ICD-10-CM | POA: Diagnosis not present

## 2023-01-25 DIAGNOSIS — Z5111 Encounter for antineoplastic chemotherapy: Secondary | ICD-10-CM | POA: Diagnosis present

## 2023-01-25 DIAGNOSIS — D649 Anemia, unspecified: Secondary | ICD-10-CM | POA: Diagnosis not present

## 2023-01-25 DIAGNOSIS — C9000 Multiple myeloma not having achieved remission: Secondary | ICD-10-CM | POA: Diagnosis present

## 2023-01-25 DIAGNOSIS — R252 Cramp and spasm: Secondary | ICD-10-CM | POA: Diagnosis not present

## 2023-01-26 ENCOUNTER — Other Ambulatory Visit: Payer: Self-pay | Admitting: Medical Oncology

## 2023-01-26 ENCOUNTER — Inpatient Hospital Stay (HOSPITAL_BASED_OUTPATIENT_CLINIC_OR_DEPARTMENT_OTHER): Payer: BC Managed Care – PPO | Admitting: Internal Medicine

## 2023-01-26 ENCOUNTER — Inpatient Hospital Stay: Payer: BC Managed Care – PPO

## 2023-01-26 ENCOUNTER — Encounter: Payer: Self-pay | Admitting: Medical Oncology

## 2023-01-26 ENCOUNTER — Encounter: Payer: Self-pay | Admitting: Internal Medicine

## 2023-01-26 VITALS — BP 129/84 | HR 74 | Temp 97.7°F | Resp 18

## 2023-01-26 VITALS — BP 134/85 | HR 84 | Temp 97.6°F | Resp 17 | Ht 70.0 in | Wt 187.8 lb

## 2023-01-26 DIAGNOSIS — D649 Anemia, unspecified: Secondary | ICD-10-CM

## 2023-01-26 DIAGNOSIS — C9000 Multiple myeloma not having achieved remission: Secondary | ICD-10-CM | POA: Diagnosis not present

## 2023-01-26 DIAGNOSIS — Z5112 Encounter for antineoplastic immunotherapy: Secondary | ICD-10-CM | POA: Diagnosis not present

## 2023-01-26 LAB — PREPARE RBC (CROSSMATCH)

## 2023-01-26 LAB — CMP (CANCER CENTER ONLY)
ALT: 13 U/L (ref 0–44)
AST: 22 U/L (ref 15–41)
Albumin: 4 g/dL (ref 3.5–5.0)
Alkaline Phosphatase: 109 U/L (ref 38–126)
Anion gap: 15 (ref 5–15)
BUN: 105 mg/dL — ABNORMAL HIGH (ref 6–20)
CO2: 15 mmol/L — ABNORMAL LOW (ref 22–32)
Calcium: 6.9 mg/dL — ABNORMAL LOW (ref 8.9–10.3)
Chloride: 108 mmol/L (ref 98–111)
Creatinine: 9.8 mg/dL (ref 0.61–1.24)
GFR, Estimated: 6 mL/min — ABNORMAL LOW (ref >60)
Glucose, Bld: 134 mg/dL — ABNORMAL HIGH (ref 70–99)
Potassium: 4.6 mmol/L (ref 3.5–5.1)
Sodium: 138 mmol/L (ref 135–145)
Total Bilirubin: 0.3 mg/dL (ref 0.3–1.2)
Total Protein: 6 g/dL — ABNORMAL LOW (ref 6.5–8.1)

## 2023-01-26 LAB — CBC WITH DIFFERENTIAL (CANCER CENTER ONLY)
Abs Immature Granulocytes: 0.02 10*3/uL (ref 0.00–0.07)
Basophils Absolute: 0.1 10*3/uL (ref 0.0–0.1)
Basophils Relative: 3 %
Eosinophils Absolute: 0.1 10*3/uL (ref 0.0–0.5)
Eosinophils Relative: 3 %
HCT: 20.2 % — ABNORMAL LOW (ref 39.0–52.0)
Hemoglobin: 6.6 g/dL — CL (ref 13.0–17.0)
Immature Granulocytes: 1 %
Lymphocytes Relative: 15 %
Lymphs Abs: 0.4 10*3/uL — ABNORMAL LOW (ref 0.7–4.0)
MCH: 30.1 pg (ref 26.0–34.0)
MCHC: 32.7 g/dL (ref 30.0–36.0)
MCV: 92.2 fL (ref 80.0–100.0)
Monocytes Absolute: 0.4 10*3/uL (ref 0.1–1.0)
Monocytes Relative: 12 %
Neutro Abs: 1.9 10*3/uL (ref 1.7–7.7)
Neutrophils Relative %: 66 %
Platelet Count: 199 10*3/uL (ref 150–400)
RBC: 2.19 MIL/uL — ABNORMAL LOW (ref 4.22–5.81)
RDW: 15.3 % (ref 11.5–15.5)
WBC Count: 2.9 10*3/uL — ABNORMAL LOW (ref 4.0–10.5)
nRBC: 0 % (ref 0.0–0.2)

## 2023-01-26 LAB — SAMPLE TO BLOOD BANK

## 2023-01-26 MED ORDER — SODIUM CHLORIDE 0.9 % IV SOLN
500.0000 mg/m2 | Freq: Once | INTRAVENOUS | Status: AC
  Administered 2023-01-26: 1000 mg via INTRAVENOUS
  Filled 2023-01-26: qty 50

## 2023-01-26 MED ORDER — ACETAMINOPHEN 325 MG PO TABS
650.0000 mg | ORAL_TABLET | Freq: Once | ORAL | Status: AC
  Administered 2023-01-26: 650 mg via ORAL
  Filled 2023-01-26: qty 2

## 2023-01-26 MED ORDER — PALONOSETRON HCL INJECTION 0.25 MG/5ML
0.2500 mg | Freq: Once | INTRAVENOUS | Status: AC
  Administered 2023-01-26: 0.25 mg via INTRAVENOUS
  Filled 2023-01-26: qty 5

## 2023-01-26 MED ORDER — BORTEZOMIB CHEMO SQ INJECTION 3.5 MG (2.5MG/ML)
1.3000 mg/m2 | Freq: Once | INTRAMUSCULAR | Status: AC
  Administered 2023-01-26: 2.5 mg via SUBCUTANEOUS
  Filled 2023-01-26: qty 1

## 2023-01-26 MED ORDER — SODIUM CHLORIDE 0.9 % IV SOLN: Freq: Once | INTRAVENOUS | Status: AC

## 2023-01-26 MED ORDER — DIPHENHYDRAMINE HCL 25 MG PO CAPS
25.0000 mg | ORAL_CAPSULE | Freq: Once | ORAL | Status: AC
  Administered 2023-01-26: 25 mg via ORAL
  Filled 2023-01-26: qty 1

## 2023-01-26 NOTE — Progress Notes (Signed)
BLOOD TRANSFUSION ORDERED FOR WED.

## 2023-01-26 NOTE — Progress Notes (Signed)
[facility] [phone]   [phone]  OFFICE PROGRESS NOTE  Drew Bender, MD [address] [place] [address]  DIAGNOSIS: Recently diagnosed with multiple myeloma with lytic bone lesions in addition to renal insufficiency and bone marrow biopsy and aspirate consistent with plasma cell neoplasm with 53% plasma cells. This was diagnosed in August 2024.    PRIOR THERAPY:  Plasmapheresis status post ~4 treatments    CURRENT THERAPY: Systemic chemotherapy with subcutaneous Velcade 1.3 Mg/KG on days 1, 4, 8 and 11 every 3 weeks, cyclophosphamide 500 Mg/M2 IV on days 1 and 8 as well as Decadron 40 mg p.o. on weekly basis start with the first dose of Velcade.  He is status post 2 cycles.  Once his renal function improves, Dr. Arbutus Ped will likely recommend changing treatment to standard treatment with daratumumab, Velcade, Revlimid and Decadron for 4 cycles followed by evaluation for autologous stem cell transplant.    INTERVAL HISTORY: Drew Cooper 57 y.o. male returns to the clinic today for follow-up visit accompanied by his wife Drew Cooper.  Discussed the use of AI scribe software for clinical note transcription with the patient, who gave verbal consent to proceed.  History of Present Illness   The patient, a 57 year old with multiple myeloma, has been experiencing significant kidney dysfunction since the onset of his disease. He is currently on a regimen of Velcade, Cytoxan, and Decadron. Despite the severity of his condition, he reports feeling "pretty good" overall. However, he has been experiencing severe cramping in his legs and hands, particularly at night, which he describes as causing him to feel "handicapped." He denies any numbness or tingling sensations.  The patient's hemoglobin levels are notably low at 6.6, and he has been advised to receive two units of blood. His white blood count is reported as good at 2.9, and his platelets are  also in a good range. He denies any nausea or bleeding and reports maintaining a good appetite and stable weight.  The patient received a call from the county health department regarding a blood sample taken in August, suggesting a potential hepatitis B infection. He denies any history of hepatitis and is unsure if he has been tested for it previously. He also reports no current interventions for his kidney function, and there are no immediate plans for dialysis.  This change is expected to occur next week, with the possibility of his Thursday treatment being cancelled to start fresh the following Monday. The patient's new treatment regimen will consist of daratumumab, Velcade, Cytoxan, and dexamethasone.        MEDICAL HISTORY: Past Medical History:  Diagnosis Date   Cancer (HCC)     ALLERGIES:  has No Known Allergies.  MEDICATIONS:  Current Outpatient Medications  Medication Sig Dispense Refill   acetaminophen (TYLENOL) 500 MG tablet Take 500 mg by mouth every 6 (six) hours as needed for moderate pain.     acyclovir (ZOVIRAX) 200 MG capsule Take 1 capsule (200 mg total) by mouth daily. 30 capsule 0   dexamethasone (DECADRON) 4 MG tablet Take 10 tablets by mouth once a week, skip 9/6 and take it 9/9 instead with treatment (Patient taking differently: Take 10 tablets by mouth once a week, skip 9/6 and take it 9/9 instead with treatment. PT STATES HE TOOK ON 12/05/22) 40 tablet 4   ferric citrate (AURYXIA) 1 GM 210 MG(Fe) tablet Take 2 tablets (420 mg total) by mouth 3 (three) times daily with  meals. 180 tablet 0   lidocaine (LIDODERM) 5 % Place 1 patch onto the skin daily.     prochlorperazine (COMPAZINE) 10 MG tablet Take 1 tablet (10 mg total) by mouth every 6 (six) hours as needed. 30 tablet 2   sodium bicarbonate 650 MG tablet Take 1 tablet (650 mg total) by mouth 2 (two) times daily. 60 tablet 0   No current facility-administered medications for this visit.    SURGICAL HISTORY:   Past Surgical History:  Procedure Laterality Date   IR FLUORO GUIDE CV LINE RIGHT  11/13/2022   IR US GUIDE VASC ACCESS RIGHT  11/13/2022   KNEE SURGERY      REVIEW OF SYSTEMS:  Constitutional: positive for fatigue Eyes: negative Ears, nose, mouth, throat, and face: negative Respiratory: negative Cardiovascular: negative Gastrointestinal: negative Genitourinary:negative Integument/breast: negative Hematologic/lymphatic: negative Musculoskeletal:negative Neurological: negative Behavioral/Psych: negative Endocrine: negative Allergic/Immunologic: negative   PHYSICAL EXAMINATION: General appearance: alert, cooperative, fatigued, and no distress Head: Normocephalic, without obvious abnormality, atraumatic Neck: no adenopathy, no JVD, supple, symmetrical, trachea midline, and thyroid not enlarged, symmetric, no tenderness/mass/nodules Lymph nodes: Cervical, supraclavicular, and axillary nodes normal. Resp: clear to auscultation bilaterally Back: symmetric, no curvature. ROM normal. No CVA tenderness. Cardio: regular rate and rhythm, S1, S2 normal, no murmur, click, rub or gallop GI: soft, non-tender; bowel sounds normal; no masses,  no organomegaly Extremities: extremities normal, atraumatic, no cyanosis or edema Neurologic: Alert and oriented X 3, normal strength and tone. Normal symmetric reflexes. Normal coordination and gait  ECOG PERFORMANCE STATUS: 1 - Symptomatic but completely ambulatory  Blood pressure 134/85, pulse 84, temperature 97.6 F (36.4 C), temperature source Oral, resp. rate 17, height 5\' 10"  (1.778 m), weight 187 lb 12.8 oz (85.2 kg), SpO2 100%.  LABORATORY DATA: Lab Results  Component Value Date   WBC 2.9 (L) 01/26/2023   HGB 6.6 (LL) 01/26/2023   HCT 20.2 (L) 01/26/2023   MCV 92.2 01/26/2023   PLT 199 01/26/2023      Chemistry      Component Value Date/Time   NA 136 01/12/2023 1225   K 4.8 01/12/2023 1225   CL 106 01/12/2023 1225   CO2 20 (L)  01/12/2023 1225   BUN 95 (H) 01/12/2023 1225   CREATININE 8.66 (HH) 01/12/2023 1225      Component Value Date/Time   CALCIUM 7.5 (L) 01/12/2023 1225   ALKPHOS 115 01/12/2023 1225   AST 14 (L) 01/12/2023 1225   ALT 11 01/12/2023 1225   BILITOT 0.4 01/12/2023 1225       RADIOGRAPHIC STUDIES: No results found.  ASSESSMENT AND PLAN: This is a very pleasant 57 years old white male with multiple myeloma with lytic bone lesions in addition to renal insufficiency and bone marrow biopsy and aspirate consistent with plasma cell neoplasm with 53% plasma cells. This was diagnosed in August 2024. He is currently undergoing systemic chemotherapy with subcutaneous Velcade 1.3 Mg/KG on days 1, 4, 8 and 11 every 3 weeks, cyclophosphamide 500 Mg/M2 IV on days 1 and 8 as well as Decadron 40 mg p.o. on weekly basis start with the first dose of Velcade.  He is status post 3 cycles.  Unfortunately no improvement in his renal function so far He was referred to Dr. [name] at [facility] who recommended adding daratumumab to his current regimen with the hope for improvement of his renal function. Will start the new regimen next week.    Multiple Myeloma Currently on Velcade, Cytoxan, and Decadron. Discussed  adding Daratumumab to the regimen. -Add Daratumumab to the treatment regimen starting next week. -Cancel chemotherapy this Thursday and start the new regimen next Monday.  Anemia Hemoglobin is 6.6, likely secondary to treatment and kidney disease. -Administer 1 unit of blood today and another later in the week.  Muscle Cramps Reports cramping in legs and hands, possibly a side effect of treatment. -Continue to monitor.  Possible Hepatitis B Patient received a call from the health department regarding a possible Hepatitis B infection. -Check Hepatitis B status with the extra blood sample drawn today.  Chronic Kidney Disease Kidney function remains poor due to the myeloma. No current plans  for dialysis. -Continue to monitor kidney function.  General Health Maintenance -Continue to monitor weight and nutritional status. -Follow-up after blood transfusion and before starting the new treatment regimen next week.   He was advised to call immediately if he has any other concerning symptoms in the interval.  The patient voices understanding of current disease status and treatment options and is in agreement with the current care plan.  All questions were answered. The patient knows to call the clinic with any problems, questions or concerns. We can certainly see the patient much sooner if necessary.  The total time spent in the appointment was 35 minutes.  Disclaimer: This note was dictated with voice recognition software. Similar sounding words can inadvertently be transcribed and may not be corrected upon review.

## 2023-01-26 NOTE — Progress Notes (Signed)
Patient seen by Dr. Gypsy Balsam are within treatment parameters.  Labs reviewed: and are not all within treatment parameters. Hgb 6.6. Creatinine= 9.8. Per Dr. Arbutus Ped ,it is ok to treat pt today with  Velcade and cytoxan and hgb 6.6 and creatinine =9.8  Per physician team, Pt is ready for treatment today and 1 unit of blood today and 1 unit of blood tomorrow. Thursday appt cancelled.

## 2023-01-26 NOTE — Progress Notes (Signed)
Patient took steroids for infusion at home at 0820. Patient's creatinine is 9.8, ok to proceed with treatment per physician.

## 2023-01-26 NOTE — Patient Instructions (Addendum)
[place] [facility]  Discharge Instructions: Thank you for choosing [facility] to provide your oncology and hematology care.   If you have a lab appointment with the Cancer Center, please go directly to the Cancer Center and check in at the registration area.   Wear comfortable clothing and clothing appropriate for easy access to any Portacath or PICC line.   We strive to give you quality time with your provider. You may need to reschedule your appointment if you arrive late (15 or more minutes).  Arriving late affects you and other patients whose appointments are after yours.  Also, if you miss three or more appointments without notifying the office, you may be dismissed from the clinic at the provider's discretion.      For prescription refill requests, have your pharmacy contact our office and allow 72 hours for refills to be completed.    Today you received the following chemotherapy and/or immunotherapy agents: Velcade, Cytoxan      To help prevent nausea and vomiting after your treatment, we encourage you to take your nausea medication as directed.  BELOW ARE SYMPTOMS THAT SHOULD BE REPORTED IMMEDIATELY: *FEVER GREATER THAN 100.4 F (38 C) OR HIGHER *CHILLS OR SWEATING *NAUSEA AND VOMITING THAT IS NOT CONTROLLED WITH YOUR NAUSEA MEDICATION *UNUSUAL SHORTNESS OF BREATH *UNUSUAL BRUISING OR BLEEDING *URINARY PROBLEMS (pain or burning when urinating, or frequent urination) *BOWEL PROBLEMS (unusual diarrhea, constipation, pain near the anus) TENDERNESS IN MOUTH AND THROAT WITH OR WITHOUT PRESENCE OF ULCERS (sore throat, sores in mouth, or a toothache) UNUSUAL RASH, SWELLING OR PAIN  UNUSUAL VAGINAL DISCHARGE OR ITCHING   Items with * indicate a potential emergency and should be followed up as soon as possible or go to the Emergency Department if any problems should occur.  Please show the CHEMOTHERAPY ALERT CARD or IMMUNOTHERAPY ALERT CARD  at check-in to the Emergency Department and triage nurse.  Should you have questions after your visit or need to cancel or reschedule your appointment, please contact [facility] [facility]  Dept: [phone]  and follow the prompts.  Office hours are 8:00 a.m. to 4:30 p.m. Monday - Friday. Please note that voicemails left after 4:00 p.m. may not be returned until the following business day.  We are closed weekends and major holidays. You have access to a nurse at all times for urgent questions. Please call the main number to the clinic Dept: [phone] and follow the prompts.   For any non-urgent questions, you may also contact your provider using MyChart. We now offer e-Visits for anyone 48 and older to request care online for non-urgent symptoms. For details visit [email or web address].   Also download the MyChart app! Go to the app store, search "MyChart", open the app, select [place], and log in with your MyChart username and password.  Blood Transfusion, Adult, Care After After a blood transfusion, it is common to have: Bruising and soreness at the IV site. A headache. Follow these instructions at home: Your doctor may give you more instructions. If you have problems, contact your doctor. Insertion site care     Follow instructions from your doctor about how to take care of your insertion site. This is where an IV tube was put into your vein. Make sure you: Wash your hands with soap and water for at least 20 seconds before and after you change your bandage. If you cannot use soap and water, use hand  sanitizer. Change your bandage as told by your doctor. Check your insertion site every day for signs of infection. Check for: Redness, swelling, or pain. Bleeding from the site. Warmth. Pus or a bad smell. General instructions Take over-the-counter and prescription medicines only as told by your doctor. Rest as told by your doctor. Go back to your  normal activities as told by your doctor. Keep all follow-up visits. You may need to have tests at certain times to check your blood. Contact a doctor if: You have itching or red, swollen areas of skin (hives). You have a fever or chills. You have pain in the head, back, or chest. You feel worried or nervous (anxious). You feel weak after doing your normal activities. You have any of these problems at the insertion site: Redness, swelling, warmth, or pain. Bleeding that does not stop with pressure. Pus or a bad smell. If you received your blood transfusion in an outpatient setting, you will be told whom to contact to report any reactions. Get help right away if: You have signs of a serious reaction. This may be coming from an allergy or the body's defense system (immune system). Signs include: Trouble breathing or shortness of breath. Swelling of the face or feeling warm (flushed). A widespread rash. Dark pee (urine) or blood in the pee. Fast heartbeat. These symptoms may be an emergency. Get help right away. Call 911. Do not wait to see if the symptoms will go away. Do not drive yourself to the hospital. Summary Bruising and soreness at the IV site are common. Check your insertion site every day for signs of infection. Rest as told by your doctor. Go back to your normal activities as told by your doctor. Get help right away if you have signs of a serious reaction. This information is not intended to replace advice given to you by your health care provider. Make sure you discuss any questions you have with your health care provider. Document Revised: 06/14/2021 Document Reviewed: 06/14/2021 Elsevier Patient Education  2024 [employer].

## 2023-01-26 NOTE — Progress Notes (Signed)
DISCONTINUE ON PATHWAY REGIMEN - Multiple Myeloma and Other Plasma Cell Dyscrasias     A cycle is every 21 days:     Dexamethasone      Bortezomib      Cyclophosphamide   **Always confirm dose/schedule in your pharmacy ordering system**  REASON: Other Reason PRIOR TREATMENT: [id number]: CyBord (Cyclophosphamide 500 mg/m2 IV D1, 8 + Bortezomib 1.3 mg/m2 SUBQ D1, 4, 8, 11 + Dexamethasone 40 mg PO D1, 8, 15) q21 Days x 4 Cycles Concurrent with Referral to Transplant Service TREATMENT RESPONSE: Partial Response (PR)  START OFF PATHWAY REGIMEN - Multiple Myeloma and Other Plasma Cell Dyscrasias   [id number]:DaraCyBord (Daratumumab SUBQ + Cyclophosphamide IV + Bortezomib SUBQ + Dexamethasone PO/IV) q28 Days Followed by Daratumumab SUBQ q28 Days for up to 2 Years:   Cycles 1 and 2: A cycle is every 28 days:     Dexamethasone      Daratumumab and hyaluronidase-fihj      Cyclophosphamide      Bortezomib    Cycles 3 through 6: A cycle is every 28 days:     Dexamethasone      Dexamethasone      Daratumumab and hyaluronidase-fihj      Cyclophosphamide      Bortezomib    Cycles 7 and beyond (up to 2 years): A cycle is every 28 days:     Daratumumab and hyaluronidase-fihj   **Always confirm dose/schedule in your pharmacy ordering system**  Patient Characteristics: Multiple Myeloma, Newly Diagnosed, Transplant Eligible, Standard Risk Disease Classification: Multiple Myeloma Therapeutic Status: Newly Diagnosed R2-ISS Staging: III Is Patient Eligible for Transplant<= Transplant Eligible Risk Status: Standard Risk Intent of Therapy: Non-Curative / Palliative Intent, Discussed with Patient

## 2023-01-27 ENCOUNTER — Encounter: Payer: Self-pay | Admitting: Internal Medicine

## 2023-01-28 ENCOUNTER — Other Ambulatory Visit: Payer: Self-pay

## 2023-01-28 ENCOUNTER — Inpatient Hospital Stay: Payer: BC Managed Care – PPO

## 2023-01-28 ENCOUNTER — Other Ambulatory Visit: Payer: Self-pay | Admitting: Internal Medicine

## 2023-01-28 VITALS — BP 151/99 | HR 83 | Temp 98.0°F | Resp 18

## 2023-01-28 DIAGNOSIS — D649 Anemia, unspecified: Secondary | ICD-10-CM | POA: Diagnosis not present

## 2023-01-28 DIAGNOSIS — Z5111 Encounter for antineoplastic chemotherapy: Secondary | ICD-10-CM | POA: Diagnosis present

## 2023-01-28 DIAGNOSIS — R252 Cramp and spasm: Secondary | ICD-10-CM | POA: Diagnosis not present

## 2023-01-28 DIAGNOSIS — C9000 Multiple myeloma not having achieved remission: Secondary | ICD-10-CM | POA: Diagnosis present

## 2023-01-28 DIAGNOSIS — Z5112 Encounter for antineoplastic immunotherapy: Secondary | ICD-10-CM | POA: Diagnosis not present

## 2023-01-28 DIAGNOSIS — N189 Chronic kidney disease, unspecified: Secondary | ICD-10-CM | POA: Diagnosis not present

## 2023-01-28 LAB — PREPARE RBC (CROSSMATCH)

## 2023-01-28 MED ORDER — ACETAMINOPHEN 325 MG PO TABS
650.0000 mg | ORAL_TABLET | Freq: Once | ORAL | Status: AC
  Administered 2023-01-28: 650 mg via ORAL
  Filled 2023-01-28: qty 2

## 2023-01-28 MED ORDER — SODIUM CHLORIDE 0.9% IV SOLUTION
250.0000 mL | INTRAVENOUS | Status: DC
  Administered 2023-01-28: 100 mL via INTRAVENOUS

## 2023-01-28 MED ORDER — DIPHENHYDRAMINE HCL 25 MG PO CAPS
25.0000 mg | ORAL_CAPSULE | Freq: Once | ORAL | Status: AC
  Administered 2023-01-28: 25 mg via ORAL
  Filled 2023-01-28: qty 1

## 2023-01-28 NOTE — Patient Instructions (Signed)
Blood Transfusion, Adult A blood transfusion is a procedure in which you receive blood through an IV tube. You may need this procedure because of: A bleeding disorder. An illness. An injury. A surgery. The blood may come from someone else (a donor). You may also be able to donate blood for yourself before a surgery. The blood given in a transfusion may be made up of different types of cells. You may get: Red blood cells. These carry oxygen to the cells in the body. Platelets. These help your blood to clot. Plasma. This is the liquid part of your blood. It carries proteins and other substances through the body. White blood cells. These help you fight infections. If you have a clotting disorder, you may also get other types of blood products. Depending on the type of blood product, this procedure may take 1-4 hours to complete. Tell your doctor about: Any bleeding problems you have. Any reactions you have had during a blood transfusion in the past. Any allergies you have. All medicines you are taking, including vitamins, herbs, eye drops, creams, and over-the-counter medicines. Any surgeries you have had. Any medical conditions you have. Whether you are pregnant or may be pregnant. What are the risks? Talk with your health care provider about risks. The most common problems include: A mild allergic reaction. This includes red, swollen areas of skin (hives) and itching. Fever or chills. This may be the body's response to new blood cells received. This may happen during or up to 4 hours after the transfusion. More serious problems may include: A serious allergic reaction. This includes breathing trouble or swelling around the face and lips. Too much fluid in the lungs. This may cause breathing problems. Lung injury. This causes breathing trouble and low oxygen in the blood. This can happen within hours of the transfusion or days later. Too much iron. This can happen after getting many blood  transfusions over a period of time. An infection or virus passed through the blood. This is rare. Donated blood is carefully tested before it is given. Your body's defense system (immune system) trying to attack the new blood cells. This is rare. Symptoms may include fever, chills, nausea, low blood pressure, and low back or chest pain. Donated cells attacking healthy tissues. This is rare. What happens before the procedure? You will have a blood test to find out your blood type. The test also finds out what type of blood your body will accept and matches it to the donor type. If you are going to have a planned surgery, you may be able to donate your own blood. This may be done in case you need a transfusion. You will have your temperature, blood pressure, and pulse checked. You may receive medicine to help prevent an allergic reaction. This may be done if you have had a reaction to a transfusion before. This medicine may be given to you by mouth or through an IV tube. What happens during the procedure?  An IV tube will be put into one of your veins. The bag of blood will be attached to your IV tube. Then, the blood will enter through your vein. Your temperature, blood pressure, and pulse will be checked often. This is done to find early signs of a transfusion reaction. Tell your nurse right away if you have any of these symptoms: Shortness of breath or trouble breathing. Chest or back pain. Fever or chills. Red, swollen areas of skin or itching. If you have any signs   or symptoms of a reaction, your transfusion will be stopped. You may also be given medicine. When the transfusion is finished, your IV tube will be taken out. Pressure may be put on the IV site for a few minutes. A bandage (dressing) will be put on the IV site. The procedure may vary among doctors and hospitals. What happens after the procedure? You will be monitored until you leave the hospital or clinic. This includes  checking your temperature, blood pressure, pulse, breathing rate, and blood oxygen level. Your blood may be tested to see how you have responded to the transfusion. You may be warmed with fluids or blankets. This is done to keep the temperature of your body normal. If you have your procedure in an outpatient setting, you will be told whom to contact to report any reactions. Where to find more information Visit the [employer]: redcross.org Summary A blood transfusion is a procedure in which you receive blood through an IV tube. The blood you are given may be made up of different blood cells. You may receive red blood cells, platelets, plasma, or white blood cells. Your temperature, blood pressure, and pulse will be checked often. After the procedure, your blood may be tested to see how you have responded. This information is not intended to replace advice given to you by your health care provider. Make sure you discuss any questions you have with your health care provider. Document Revised: 06/14/2021 Document Reviewed: 06/14/2021 Elsevier Patient Education  2024 [employer] Inc.  

## 2023-01-29 ENCOUNTER — Ambulatory Visit: Payer: BC Managed Care – PPO

## 2023-01-29 ENCOUNTER — Other Ambulatory Visit: Payer: Self-pay | Admitting: Internal Medicine

## 2023-01-29 DIAGNOSIS — C9000 Multiple myeloma not having achieved remission: Secondary | ICD-10-CM

## 2023-01-29 LAB — TYPE AND SCREEN
ABO/RH(D): AB POS
Antibody Screen: NEGATIVE
Unit division: 0
Unit division: 0

## 2023-01-29 LAB — BPAM RBC
Blood Product Expiration Date: 202411202359
Blood Product Expiration Date: 202411202359
ISSUE DATE / TIME: 202410281102
ISSUE DATE / TIME: 202410300855
Unit Type and Rh: 6200
Unit Type and Rh: 6200

## 2023-02-02 ENCOUNTER — Encounter: Payer: Self-pay | Admitting: Internal Medicine

## 2023-02-02 ENCOUNTER — Inpatient Hospital Stay: Payer: BC Managed Care – PPO

## 2023-02-02 ENCOUNTER — Inpatient Hospital Stay: Payer: BC Managed Care – PPO | Attending: Internal Medicine

## 2023-02-02 ENCOUNTER — Other Ambulatory Visit: Payer: Self-pay | Admitting: Medical Oncology

## 2023-02-02 ENCOUNTER — Inpatient Hospital Stay (HOSPITAL_BASED_OUTPATIENT_CLINIC_OR_DEPARTMENT_OTHER): Payer: BC Managed Care – PPO | Admitting: Internal Medicine

## 2023-02-02 ENCOUNTER — Encounter: Payer: Self-pay | Admitting: Medical Oncology

## 2023-02-02 VITALS — BP 145/86 | HR 72 | Temp 97.7°F | Resp 18 | Ht 70.0 in | Wt 186.1 lb

## 2023-02-02 DIAGNOSIS — C9000 Multiple myeloma not having achieved remission: Secondary | ICD-10-CM

## 2023-02-02 DIAGNOSIS — G629 Polyneuropathy, unspecified: Secondary | ICD-10-CM | POA: Diagnosis not present

## 2023-02-02 DIAGNOSIS — Z5111 Encounter for antineoplastic chemotherapy: Secondary | ICD-10-CM | POA: Diagnosis present

## 2023-02-02 DIAGNOSIS — N189 Chronic kidney disease, unspecified: Secondary | ICD-10-CM | POA: Diagnosis not present

## 2023-02-02 DIAGNOSIS — D509 Iron deficiency anemia, unspecified: Secondary | ICD-10-CM | POA: Diagnosis not present

## 2023-02-02 DIAGNOSIS — Z5112 Encounter for antineoplastic immunotherapy: Secondary | ICD-10-CM | POA: Diagnosis present

## 2023-02-02 LAB — CBC WITH DIFFERENTIAL (CANCER CENTER ONLY)
Abs Immature Granulocytes: 0.02 10*3/uL (ref 0.00–0.07)
Basophils Absolute: 0.1 10*3/uL (ref 0.0–0.1)
Basophils Relative: 2 %
Eosinophils Absolute: 0.1 10*3/uL (ref 0.0–0.5)
Eosinophils Relative: 2 %
HCT: 24.6 % — ABNORMAL LOW (ref 39.0–52.0)
Hemoglobin: 8.2 g/dL — ABNORMAL LOW (ref 13.0–17.0)
Immature Granulocytes: 1 %
Lymphocytes Relative: 9 %
Lymphs Abs: 0.3 10*3/uL — ABNORMAL LOW (ref 0.7–4.0)
MCH: 30.5 pg (ref 26.0–34.0)
MCHC: 33.3 g/dL (ref 30.0–36.0)
MCV: 91.4 fL (ref 80.0–100.0)
Monocytes Absolute: 0.3 10*3/uL (ref 0.1–1.0)
Monocytes Relative: 9 %
Neutro Abs: 2.9 10*3/uL (ref 1.7–7.7)
Neutrophils Relative %: 77 %
Platelet Count: 145 10*3/uL — ABNORMAL LOW (ref 150–400)
RBC: 2.69 MIL/uL — ABNORMAL LOW (ref 4.22–5.81)
RDW: 14.5 % (ref 11.5–15.5)
WBC Count: 3.7 10*3/uL — ABNORMAL LOW (ref 4.0–10.5)
nRBC: 0 % (ref 0.0–0.2)

## 2023-02-02 LAB — CMP (CANCER CENTER ONLY)
ALT: 13 U/L (ref 0–44)
AST: 18 U/L (ref 15–41)
Albumin: 4 g/dL (ref 3.5–5.0)
Alkaline Phosphatase: 107 U/L (ref 38–126)
Anion gap: 11 (ref 5–15)
BUN: 107 mg/dL — ABNORMAL HIGH (ref 6–20)
CO2: 17 mmol/L — ABNORMAL LOW (ref 22–32)
Calcium: 7.6 mg/dL — ABNORMAL LOW (ref 8.9–10.3)
Chloride: 109 mmol/L (ref 98–111)
Creatinine: 9.08 mg/dL (ref 0.61–1.24)
GFR, Estimated: 6 mL/min — ABNORMAL LOW (ref >60)
Glucose, Bld: 86 mg/dL (ref 70–99)
Potassium: 4.7 mmol/L (ref 3.5–5.1)
Sodium: 137 mmol/L (ref 135–145)
Total Bilirubin: 0.3 mg/dL (ref <1.2)
Total Protein: 6.2 g/dL — ABNORMAL LOW (ref 6.5–8.1)

## 2023-02-02 LAB — TYPE AND SCREEN
ABO/RH(D): AB POS
Antibody Screen: NEGATIVE

## 2023-02-02 MED ORDER — PALONOSETRON HCL INJECTION 0.25 MG/5ML
0.2500 mg | Freq: Once | INTRAVENOUS | Status: AC
  Administered 2023-02-02: 0.25 mg via INTRAVENOUS
  Filled 2023-02-02: qty 5

## 2023-02-02 MED ORDER — DARATUMUMAB-HYALURONIDASE-FIHJ 1800-30000 MG-UT/15ML ~~LOC~~ SOLN
1800.0000 mg | Freq: Once | SUBCUTANEOUS | Status: AC
Start: 2023-02-02 — End: 2023-02-02
  Administered 2023-02-02: 1800 mg via SUBCUTANEOUS
  Filled 2023-02-02: qty 15

## 2023-02-02 MED ORDER — SODIUM CHLORIDE 0.9 % IV SOLN: Freq: Once | INTRAVENOUS | Status: AC

## 2023-02-02 MED ORDER — BORTEZOMIB CHEMO SQ INJECTION 3.5 MG (2.5MG/ML)
1.5000 mg/m2 | Freq: Once | INTRAMUSCULAR | Status: AC
  Administered 2023-02-02: 3 mg via SUBCUTANEOUS
  Filled 2023-02-02: qty 1.2

## 2023-02-02 MED ORDER — SODIUM CHLORIDE 0.9 % IV SOLN
500.0000 mg/m2 | Freq: Once | INTRAVENOUS | Status: AC
  Administered 2023-02-02: 1000 mg via INTRAVENOUS
  Filled 2023-02-02: qty 50

## 2023-02-02 MED ORDER — ACETAMINOPHEN 325 MG PO TABS
650.0000 mg | ORAL_TABLET | Freq: Once | ORAL | Status: AC
  Administered 2023-02-02: 650 mg via ORAL
  Filled 2023-02-02: qty 2

## 2023-02-02 MED ORDER — DEXAMETHASONE 4 MG PO TABS
40.0000 mg | ORAL_TABLET | Freq: Once | ORAL | Status: AC
Start: 2023-02-02 — End: 2023-02-02
  Administered 2023-02-02: 40 mg via ORAL
  Filled 2023-02-02: qty 10

## 2023-02-02 MED ORDER — DIPHENHYDRAMINE HCL 25 MG PO CAPS
50.0000 mg | ORAL_CAPSULE | Freq: Once | ORAL | Status: AC
  Administered 2023-02-02: 50 mg via ORAL
  Filled 2023-02-02: qty 2

## 2023-02-02 MED ORDER — MONTELUKAST SODIUM 10 MG PO TABS
10.0000 mg | ORAL_TABLET | Freq: Once | ORAL | Status: AC
Start: 2023-02-02 — End: 2023-02-02
  Administered 2023-02-02: 10 mg via ORAL
  Filled 2023-02-02: qty 1

## 2023-02-02 NOTE — Progress Notes (Signed)
Patient waited 2 hour post Darzalex fast pro injection with no issues

## 2023-02-02 NOTE — Progress Notes (Signed)
[facility] [phone]   Fax:([phone]  OFFICE PROGRESS NOTE  [name], MD [address] [place] [address]  DIAGNOSIS: Recently diagnosed with multiple myeloma with lytic bone lesions in addition to renal insufficiency and bone marrow biopsy and aspirate consistent with plasma cell neoplasm with 53% plasma cells. This was diagnosed in August 2024.    PRIOR THERAPY:  Plasmapheresis status post ~4 treatments    CURRENT THERAPY: Systemic chemotherapy with subcutaneous Velcade 1.3 Mg/KG on days 1, 4, 8 and 11 every 3 weeks, cyclophosphamide 500 Mg/M2 IV on days 1 and 8 as well as Decadron 40 mg p.o. on weekly basis start with the first dose of Velcade.  He is status post 2 cycles.  Once his renal function improves, Dr. Arbutus Ped will likely recommend changing treatment to standard treatment with daratumumab, Velcade, Revlimid and Decadron for 4 cycles followed by evaluation for autologous stem cell transplant.    INTERVAL HISTORY: Drew Cooper 57 y.o. male returns to the clinic today for follow-up visit accompanied by his wife [name]. Discussed the use of AI scribe software for clinical note transcription with the patient, who gave verbal consent to proceed.  History of Present Illness   The patient, Drew Cooper, a 57 year old individual with a history of multiple myeloma, presented for a follow-up visit. He had been on a regimen of cyclophosphamide, bortezomib, and dexamethasone (CyBorD) for several months.  Since his last blood transfusion a week ago, the patient reported feeling good and noticed an increase in energy levels. However, he also experienced drowsiness, which he attributed to the Benadryl he had taken.  The patient denied any new complaints such as chest pain, breathing issues, nausea, or vomiting. He also reported no new updates from his nephrologist, despite a persistently elevated serum creatinine level above  nine.  The patient continued to experience significant leg pain, which he managed with Tylenol. He reported needing to take the medication in the middle of the night due to the pain. He had previously taken gabapentin for peripheral neuropathy, a side effect of his treatment, but had discontinued it.      MEDICAL HISTORY: Past Medical History:  Diagnosis Date   Cancer (HCC)     ALLERGIES:  has No Known Allergies.  MEDICATIONS:  Current Outpatient Medications  Medication Sig Dispense Refill   acetaminophen (TYLENOL) 500 MG tablet Take 500 mg by mouth every 6 (six) hours as needed for moderate pain.     acyclovir (ZOVIRAX) 200 MG capsule Take 1 capsule (200 mg total) by mouth daily. 30 capsule 0   dexamethasone (DECADRON) 4 MG tablet Take 10 tablets by mouth once a week, skip 9/6 and take it 9/9 instead with treatment (Patient taking differently: Take 10 tablets by mouth once a week, skip 9/6 and take it 9/9 instead with treatment. PT STATES HE TOOK ON 12/05/22) 40 tablet 4   ferric citrate (AURYXIA) 1 GM 210 MG(Fe) tablet Take 2 tablets (420 mg total) by mouth 3 (three) times daily with meals. 180 tablet 0   lidocaine (LIDODERM) 5 % Place 1 patch onto the skin daily.     prochlorperazine (COMPAZINE) 10 MG tablet Take 1 tablet (10 mg total) by mouth every 6 (six) hours as needed. 30 tablet 2   sodium bicarbonate 650 MG tablet Take 1 tablet (650 mg total) by mouth 2 (two) times daily. 60 tablet 0   No current facility-administered medications for this visit.  SURGICAL HISTORY:  Past Surgical History:  Procedure Laterality Date   IR FLUORO GUIDE CV LINE RIGHT  11/13/2022   IR US GUIDE VASC ACCESS RIGHT  11/13/2022   KNEE SURGERY      REVIEW OF SYSTEMS:  A comprehensive review of systems was negative except for: Constitutional: positive for fatigue Neurological: positive for paresthesia   PHYSICAL EXAMINATION: General appearance: alert, cooperative, fatigued, and no distress Head:  Normocephalic, without obvious abnormality, atraumatic Neck: no adenopathy, no JVD, supple, symmetrical, trachea midline, and thyroid not enlarged, symmetric, no tenderness/mass/nodules Lymph nodes: Cervical, supraclavicular, and axillary nodes normal. Resp: clear to auscultation bilaterally Back: symmetric, no curvature. ROM normal. No CVA tenderness. Cardio: regular rate and rhythm, S1, S2 normal, no murmur, click, rub or gallop GI: soft, non-tender; bowel sounds normal; no masses,  no organomegaly Extremities: extremities normal, atraumatic, no cyanosis or edema  ECOG PERFORMANCE STATUS: 1 - Symptomatic but completely ambulatory  Blood pressure (!) 145/86, pulse 72, temperature 97.7 F (36.5 C), temperature source Oral, resp. rate 18, height 5\' 10"  (1.778 m), weight 186 lb 1.6 oz (84.4 kg), SpO2 100%.  LABORATORY DATA: Lab Results  Component Value Date   WBC 2.9 (L) 01/26/2023   HGB 6.6 (LL) 01/26/2023   HCT 20.2 (L) 01/26/2023   MCV 92.2 01/26/2023   PLT 199 01/26/2023      Chemistry      Component Value Date/Time   NA 138 01/26/2023 0743   K 4.6 01/26/2023 0743   CL 108 01/26/2023 0743   CO2 15 (L) 01/26/2023 0743   BUN 105 (H) 01/26/2023 0743   CREATININE 9.80 (HH) 01/26/2023 0743      Component Value Date/Time   CALCIUM 6.9 (L) 01/26/2023 0743   ALKPHOS 109 01/26/2023 0743   AST 22 01/26/2023 0743   ALT 13 01/26/2023 0743   BILITOT 0.3 01/26/2023 0743       RADIOGRAPHIC STUDIES: No results found.  ASSESSMENT AND PLAN: This is a very pleasant 57 years old white male with multiple myeloma with lytic bone lesions in addition to renal insufficiency and bone marrow biopsy and aspirate consistent with plasma cell neoplasm with 53% plasma cells. This was diagnosed in August 2024. He is currently undergoing systemic chemotherapy with subcutaneous Velcade 1.3 Mg/KG on days 1, 4, 8 and 11 every 3 weeks, cyclophosphamide 500 Mg/M2 IV on days 1 and 8 as well as Decadron 40  mg p.o. on weekly basis start with the first dose of Velcade.  He is status post 3 cycles.  Unfortunately no improvement in his renal function so far He was referred to Dr. [name] at [facility] who recommended adding daratumumab to his current regimen with the hope for improvement of his renal function.    Multiple Myeloma Patient has been on a regimen of Cytoxan, Velcade, and Dexamethasone. Daratumumab has been added to the regimen. Patient reports increased energy post blood transfusion. -Continue current regimen with addition of Daratumumab. -Monitor blood counts and kidney function.  Peripheral Neuropathy Patient reports significant leg pain, likely secondary to chemotherapy. Currently managing with Tylenol. -Offered addition of Gabapentin for neuropathic pain, patient declined at this time.  Chronic Kidney Disease Serum creatinine consistently above 9. No new updates from nephrologist. -Continue to monitor kidney function.  Follow-up in 2 weeks during ongoing treatment.   The patient was advised to call immediately if he has any concerning symptoms in the interval. The patient voices understanding of current disease status and treatment options and is  in agreement with the current care plan.  All questions were answered. The patient knows to call the clinic with any problems, questions or concerns. We can certainly see the patient much sooner if necessary.  The total time spent in the appointment was 20 minutes.  Disclaimer: This note was dictated with voice recognition software. Similar sounding words can inadvertently be transcribed and may not be corrected upon review.

## 2023-02-02 NOTE — Progress Notes (Unsigned)
Patient seen by Dr. Gypsy Balsam are with in treatment parameters.  Labs reviewed: CBC/diff reviewed and within treatment parameters.  It is ok to treat pt today with Cytoxan , Velcade and Daratumumab  without CMP results.  Per physician team, patient is ready for treatment and there are NO modifications to the treatment plan.

## 2023-02-02 NOTE — Patient Instructions (Signed)
[place] [facility]  Discharge Instructions: Thank you for choosing [facility] to provide your oncology and hematology care.   If you have a lab appointment with the Cancer Center, please go directly to the Cancer Center and check in at the registration area.   Wear comfortable clothing and clothing appropriate for easy access to any Portacath or PICC line.   We strive to give you quality time with your provider. You may need to reschedule your appointment if you arrive late (15 or more minutes).  Arriving late affects you and other patients whose appointments are after yours.  Also, if you miss three or more appointments without notifying the office, you may be dismissed from the clinic at the provider's discretion.      For prescription refill requests, have your pharmacy contact our office and allow 72 hours for refills to be completed.    Today you received the following chemotherapy and/or immunotherapy agents: Velcade, Cytoxan, darzalex fastpro       To help prevent nausea and vomiting after your treatment, we encourage you to take your nausea medication as directed.  BELOW ARE SYMPTOMS THAT SHOULD BE REPORTED IMMEDIATELY: *FEVER GREATER THAN 100.4 F (38 C) OR HIGHER *CHILLS OR SWEATING *NAUSEA AND VOMITING THAT IS NOT CONTROLLED WITH YOUR NAUSEA MEDICATION *UNUSUAL SHORTNESS OF BREATH *UNUSUAL BRUISING OR BLEEDING *URINARY PROBLEMS (pain or burning when urinating, or frequent urination) *BOWEL PROBLEMS (unusual diarrhea, constipation, pain near the anus) TENDERNESS IN MOUTH AND THROAT WITH OR WITHOUT PRESENCE OF ULCERS (sore throat, sores in mouth, or a toothache) UNUSUAL RASH, SWELLING OR PAIN  UNUSUAL VAGINAL DISCHARGE OR ITCHING   Items with * indicate a potential emergency and should be followed up as soon as possible or go to the Emergency Department if any problems should occur.  Please show the CHEMOTHERAPY ALERT CARD or  IMMUNOTHERAPY ALERT CARD at check-in to the Emergency Department and triage nurse.  Should you have questions after your visit or need to cancel or reschedule your appointment, please contact [place] [facility]  Dept: [phone]  and follow the prompts.  Office hours are 8:00 a.m. to 4:30 p.m. Monday - Friday. Please note that voicemails left after 4:00 p.m. may not be returned until the following business day.  We are closed weekends and major holidays. You have access to a nurse at all times for urgent questions. Please call the main number to the clinic Dept: [phone] and follow the prompts.   For any non-urgent questions, you may also contact your provider using MyChart. We now offer e-Visits for anyone 80 and older to request care online for non-urgent symptoms. For details visit [email or web address].   Also download the MyChart app! Go to the app store, search "MyChart", open the app, select [place], and log in with your MyChart username and password.  Blood Transfusion, Adult, Care After After a blood transfusion, it is common to have: Bruising and soreness at the IV site. A headache. Follow these instructions at home: Your doctor may give you more instructions. If you have problems, contact your doctor. Insertion site care     Follow instructions from your doctor about how to take care of your insertion site. This is where an IV tube was put into your vein. Make sure you: Wash your hands with soap and water for at least 20 seconds before and after you change your bandage. If you cannot use soap and  water, use hand sanitizer. Change your bandage as told by your doctor. Check your insertion site every day for signs of infection. Check for: Redness, swelling, or pain. Bleeding from the site. Warmth. Pus or a bad smell. General instructions Take over-the-counter and prescription medicines only as told by your doctor. Rest as told by your  doctor. Go back to your normal activities as told by your doctor. Keep all follow-up visits. You may need to have tests at certain times to check your blood. Contact a doctor if: You have itching or red, swollen areas of skin (hives). You have a fever or chills. You have pain in the head, back, or chest. You feel worried or nervous (anxious). You feel weak after doing your normal activities. You have any of these problems at the insertion site: Redness, swelling, warmth, or pain. Bleeding that does not stop with pressure. Pus or a bad smell. If you received your blood transfusion in an outpatient setting, you will be told whom to contact to report any reactions. Get help right away if: You have signs of a serious reaction. This may be coming from an allergy or the body's defense system (immune system). Signs include: Trouble breathing or shortness of breath. Swelling of the face or feeling warm (flushed). A widespread rash. Dark pee (urine) or blood in the pee. Fast heartbeat. These symptoms may be an emergency. Get help right away. Call 911. Do not wait to see if the symptoms will go away. Do not drive yourself to the hospital. Summary Bruising and soreness at the IV site are common. Check your insertion site every day for signs of infection. Rest as told by your doctor. Go back to your normal activities as told by your doctor. Get help right away if you have signs of a serious reaction. This information is not intended to replace advice given to you by your health care provider. Make sure you discuss any questions you have with your health care provider. Document Revised: 06/14/2021 Document Reviewed: 06/14/2021 Elsevier Patient Education  2024 [employer].

## 2023-02-03 ENCOUNTER — Telehealth: Payer: Self-pay

## 2023-02-03 NOTE — Telephone Encounter (Signed)
LM for patient that this nurse was calling to see how they were doing after their treatment. Please call back to Dr. Mohamed's nurse at [phone] if they have any questions or concerns regarding the treatment.  

## 2023-02-03 NOTE — Telephone Encounter (Signed)
-----   Message from Nurse Barbara Cower E sent at 02/02/2023  2:27 PM EST ----- Regarding: first time darzalex fast pro- Drew Cooper Patient received darzalex fast pro for the first time today with no issues. He is followed by Dr. Arbutus Ped. Waited his 2 hour post observation with no issues. :)

## 2023-02-05 ENCOUNTER — Inpatient Hospital Stay: Payer: BC Managed Care – PPO

## 2023-02-05 ENCOUNTER — Inpatient Hospital Stay: Payer: BC Managed Care – PPO | Admitting: Nutrition

## 2023-02-06 ENCOUNTER — Other Ambulatory Visit: Payer: Self-pay

## 2023-02-06 ENCOUNTER — Telehealth: Payer: Self-pay | Admitting: Medical Oncology

## 2023-02-06 ENCOUNTER — Encounter: Payer: Self-pay | Admitting: Internal Medicine

## 2023-02-06 NOTE — Telephone Encounter (Signed)
Pt notified that Dr Arbutus Ped spoke to Dr [name]: the tx you were receiving was not as effective as they wanted so Dr Arbutus Ped is starting a new tx next week. He voiced understanding.

## 2023-02-06 NOTE — Telephone Encounter (Signed)
err

## 2023-02-06 NOTE — Telephone Encounter (Signed)
Why was appt cancelled this week?Marland Kitchen

## 2023-02-09 ENCOUNTER — Encounter: Payer: Self-pay | Admitting: Internal Medicine

## 2023-02-09 ENCOUNTER — Other Ambulatory Visit: Payer: Self-pay | Admitting: Internal Medicine

## 2023-02-09 ENCOUNTER — Inpatient Hospital Stay: Payer: BC Managed Care – PPO

## 2023-02-09 ENCOUNTER — Other Ambulatory Visit: Payer: Self-pay

## 2023-02-09 VITALS — BP 133/86 | HR 85 | Temp 97.9°F | Resp 20 | Wt 184.2 lb

## 2023-02-09 DIAGNOSIS — C9000 Multiple myeloma not having achieved remission: Secondary | ICD-10-CM

## 2023-02-09 DIAGNOSIS — Z5112 Encounter for antineoplastic immunotherapy: Secondary | ICD-10-CM | POA: Diagnosis not present

## 2023-02-09 LAB — CMP (CANCER CENTER ONLY)
ALT: 12 U/L (ref 0–44)
AST: 18 U/L (ref 15–41)
Albumin: 3.9 g/dL (ref 3.5–5.0)
Alkaline Phosphatase: 110 U/L (ref 38–126)
Anion gap: 11 (ref 5–15)
BUN: 98 mg/dL — ABNORMAL HIGH (ref 6–20)
CO2: 16 mmol/L — ABNORMAL LOW (ref 22–32)
Calcium: 7.5 mg/dL — ABNORMAL LOW (ref 8.9–10.3)
Chloride: 109 mmol/L (ref 98–111)
Creatinine: 8.72 mg/dL (ref 0.61–1.24)
GFR, Estimated: 6 mL/min — ABNORMAL LOW (ref >60)
Glucose, Bld: 90 mg/dL (ref 70–99)
Potassium: 4.6 mmol/L (ref 3.5–5.1)
Sodium: 136 mmol/L (ref 135–145)
Total Bilirubin: 0.3 mg/dL (ref <1.2)
Total Protein: 5.9 g/dL — ABNORMAL LOW (ref 6.5–8.1)

## 2023-02-09 LAB — CBC WITH DIFFERENTIAL (CANCER CENTER ONLY)
Abs Immature Granulocytes: 0.02 10*3/uL (ref 0.00–0.07)
Basophils Absolute: 0.1 10*3/uL (ref 0.0–0.1)
Basophils Relative: 2 %
Eosinophils Absolute: 0.1 10*3/uL (ref 0.0–0.5)
Eosinophils Relative: 2 %
HCT: 20 % — ABNORMAL LOW (ref 39.0–52.0)
Hemoglobin: 7 g/dL — ABNORMAL LOW (ref 13.0–17.0)
Immature Granulocytes: 1 %
Lymphocytes Relative: 8 %
Lymphs Abs: 0.3 10*3/uL — ABNORMAL LOW (ref 0.7–4.0)
MCH: 31.1 pg (ref 26.0–34.0)
MCHC: 35 g/dL (ref 30.0–36.0)
MCV: 88.9 fL (ref 80.0–100.0)
Monocytes Absolute: 0.2 10*3/uL (ref 0.1–1.0)
Monocytes Relative: 6 %
Neutro Abs: 2.8 10*3/uL (ref 1.7–7.7)
Neutrophils Relative %: 81 %
Platelet Count: 121 10*3/uL — ABNORMAL LOW (ref 150–400)
RBC: 2.25 MIL/uL — ABNORMAL LOW (ref 4.22–5.81)
RDW: 14.2 % (ref 11.5–15.5)
WBC Count: 3.4 10*3/uL — ABNORMAL LOW (ref 4.0–10.5)
nRBC: 0 % (ref 0.0–0.2)

## 2023-02-09 LAB — SAMPLE TO BLOOD BANK

## 2023-02-09 MED ORDER — MONTELUKAST SODIUM 10 MG PO TABS
10.0000 mg | ORAL_TABLET | Freq: Once | ORAL | Status: AC
  Administered 2023-02-09: 10 mg via ORAL
  Filled 2023-02-09: qty 1

## 2023-02-09 MED ORDER — SODIUM CHLORIDE 0.9 % IV SOLN
500.0000 mg/m2 | Freq: Once | INTRAVENOUS | Status: AC
  Administered 2023-02-09: 1000 mg via INTRAVENOUS
  Filled 2023-02-09: qty 50

## 2023-02-09 MED ORDER — PALONOSETRON HCL INJECTION 0.25 MG/5ML
0.2500 mg | Freq: Once | INTRAVENOUS | Status: AC
  Administered 2023-02-09: 0.25 mg via INTRAVENOUS
  Filled 2023-02-09: qty 5

## 2023-02-09 MED ORDER — DIPHENHYDRAMINE HCL 25 MG PO CAPS
50.0000 mg | ORAL_CAPSULE | Freq: Once | ORAL | Status: AC
  Administered 2023-02-09: 50 mg via ORAL
  Filled 2023-02-09: qty 2

## 2023-02-09 MED ORDER — DEXAMETHASONE 4 MG PO TABS
40.0000 mg | ORAL_TABLET | Freq: Once | ORAL | Status: AC
Start: 2023-02-09 — End: 2023-02-09
  Administered 2023-02-09: 40 mg via ORAL
  Filled 2023-02-09: qty 10

## 2023-02-09 MED ORDER — BORTEZOMIB CHEMO SQ INJECTION 3.5 MG (2.5MG/ML)
1.5000 mg/m2 | Freq: Once | INTRAMUSCULAR | Status: AC
  Administered 2023-02-09: 3 mg via SUBCUTANEOUS
  Filled 2023-02-09: qty 1.2

## 2023-02-09 MED ORDER — DARATUMUMAB-HYALURONIDASE-FIHJ 1800-30000 MG-UT/15ML ~~LOC~~ SOLN
1800.0000 mg | Freq: Once | SUBCUTANEOUS | Status: AC
  Administered 2023-02-09: 1800 mg via SUBCUTANEOUS
  Filled 2023-02-09: qty 15

## 2023-02-09 MED ORDER — ACETAMINOPHEN 325 MG PO TABS
650.0000 mg | ORAL_TABLET | Freq: Once | ORAL | Status: AC
  Administered 2023-02-09: 650 mg via ORAL
  Filled 2023-02-09: qty 2

## 2023-02-09 MED ORDER — SODIUM CHLORIDE 0.9 % IV SOLN: INTRAVENOUS | Status: DC

## 2023-02-09 MED ORDER — GABAPENTIN 100 MG PO CAPS: 100.0000 mg | ORAL_CAPSULE | Freq: Three times a day (TID) | ORAL | 1 refills | Status: DC

## 2023-02-09 NOTE — Patient Instructions (Signed)
[place] [facility] - A DEPT OF [facility]  Discharge Instructions: Thank you for choosing [facility] to provide your oncology and hematology care.   If you have a lab appointment with the Cancer Center, please go directly to the Cancer Center and check in at the registration area.   Wear comfortable clothing and clothing appropriate for easy access to any Portacath or PICC line.   We strive to give you quality time with your provider. You may need to reschedule your appointment if you arrive late (15 or more minutes).  Arriving late affects you and other patients whose appointments are after yours.  Also, if you miss three or more appointments without notifying the office, you may be dismissed from the clinic at the provider's discretion.      For prescription refill requests, have your pharmacy contact our office and allow 72 hours for refills to be completed.    Today you received the following chemotherapy and/or immunotherapy agents: Velcade, Cytoxan, darzalex fastpro       To help prevent nausea and vomiting after your treatment, we encourage you to take your nausea medication as directed.  BELOW ARE SYMPTOMS THAT SHOULD BE REPORTED IMMEDIATELY: *FEVER GREATER THAN 100.4 F (38 C) OR HIGHER *CHILLS OR SWEATING *NAUSEA AND VOMITING THAT IS NOT CONTROLLED WITH YOUR NAUSEA MEDICATION *UNUSUAL SHORTNESS OF BREATH *UNUSUAL BRUISING OR BLEEDING *URINARY PROBLEMS (pain or burning when urinating, or frequent urination) *BOWEL PROBLEMS (unusual diarrhea, constipation, pain near the anus) TENDERNESS IN MOUTH AND THROAT WITH OR WITHOUT PRESENCE OF ULCERS (sore throat, sores in mouth, or a toothache) UNUSUAL RASH, SWELLING OR PAIN  UNUSUAL VAGINAL DISCHARGE OR ITCHING   Items with * indicate a potential emergency and should be followed up as soon as possible or go to the Emergency Department if any problems should occur.  Please show the CHEMOTHERAPY  ALERT CARD or IMMUNOTHERAPY ALERT CARD at check-in to the Emergency Department and triage nurse.  Should you have questions after your visit or need to cancel or reschedule your appointment, please contact [place] [facility] - A DEPT OF [facility]  Dept: [phone]  and follow the prompts.  Office hours are 8:00 a.m. to 4:30 p.m. Monday - Friday. Please note that voicemails left after 4:00 p.m. may not be returned until the following business day.  We are closed weekends and major holidays. You have access to a nurse at all times for urgent questions. Please call the main number to the clinic Dept: [phone] and follow the prompts.   For any non-urgent questions, you may also contact your provider using MyChart. We now offer e-Visits for anyone 88 and older to request care online for non-urgent symptoms. For details visit [email or web address].   Also download the MyChart app! Go to the app store, search "MyChart", open the app, select [place], and log in with your MyChart username and password.  Blood Transfusion, Adult, Care After After a blood transfusion, it is common to have: Bruising and soreness at the IV site. A headache. Follow these instructions at home: Your doctor may give you more instructions. If you have problems, contact your doctor. Insertion site care     Follow instructions from your doctor about how to take care of your insertion site. This is where an IV tube was put into your vein. Make sure you: Wash your hands with soap and water for at least 20 seconds before and after  you change your bandage. If you cannot use soap and water, use hand sanitizer. Change your bandage as told by your doctor. Check your insertion site every day for signs of infection. Check for: Redness, swelling, or pain. Bleeding from the site. Warmth. Pus or a bad smell. General instructions Take over-the-counter and prescription medicines only as told by your  doctor. Rest as told by your doctor. Go back to your normal activities as told by your doctor. Keep all follow-up visits. You may need to have tests at certain times to check your blood. Contact a doctor if: You have itching or red, swollen areas of skin (hives). You have a fever or chills. You have pain in the head, back, or chest. You feel worried or nervous (anxious). You feel weak after doing your normal activities. You have any of these problems at the insertion site: Redness, swelling, warmth, or pain. Bleeding that does not stop with pressure. Pus or a bad smell. If you received your blood transfusion in an outpatient setting, you will be told whom to contact to report any reactions. Get help right away if: You have signs of a serious reaction. This may be coming from an allergy or the body's defense system (immune system). Signs include: Trouble breathing or shortness of breath. Swelling of the face or feeling warm (flushed). A widespread rash. Dark pee (urine) or blood in the pee. Fast heartbeat. These symptoms may be an emergency. Get help right away. Call 911. Do not wait to see if the symptoms will go away. Do not drive yourself to the hospital. Summary Bruising and soreness at the IV site are common. Check your insertion site every day for signs of infection. Rest as told by your doctor. Go back to your normal activities as told by your doctor. Get help right away if you have signs of a serious reaction. This information is not intended to replace advice given to you by your health care provider. Make sure you discuss any questions you have with your health care provider. Document Revised: 06/14/2021 Document Reviewed: 06/14/2021 Elsevier Patient Education  2024 [employer].

## 2023-02-09 NOTE — Progress Notes (Signed)
Verbal order from Dr. Arbutus Ped - ok for patient to receive treatment today with Creatinine 8.72 and Hemoglobin 7.

## 2023-02-10 ENCOUNTER — Other Ambulatory Visit: Payer: Self-pay | Admitting: Medical Oncology

## 2023-02-10 ENCOUNTER — Other Ambulatory Visit: Payer: Self-pay | Admitting: Physician Assistant

## 2023-02-10 ENCOUNTER — Telehealth: Payer: Self-pay | Admitting: Medical Oncology

## 2023-02-10 DIAGNOSIS — C9000 Multiple myeloma not having achieved remission: Secondary | ICD-10-CM

## 2023-02-10 DIAGNOSIS — D649 Anemia, unspecified: Secondary | ICD-10-CM

## 2023-02-10 LAB — PREPARE RBC (CROSSMATCH)

## 2023-02-10 LAB — PRETREATMENT RBC PHENOTYPE

## 2023-02-10 NOTE — Telephone Encounter (Signed)
Pt confirmed appt for blood transfusion tomorrow.

## 2023-02-11 ENCOUNTER — Other Ambulatory Visit: Payer: Self-pay

## 2023-02-11 ENCOUNTER — Inpatient Hospital Stay: Payer: BC Managed Care – PPO

## 2023-02-11 DIAGNOSIS — D649 Anemia, unspecified: Secondary | ICD-10-CM

## 2023-02-11 DIAGNOSIS — Z5112 Encounter for antineoplastic immunotherapy: Secondary | ICD-10-CM | POA: Diagnosis not present

## 2023-02-11 MED ORDER — DIPHENHYDRAMINE HCL 25 MG PO CAPS
25.0000 mg | ORAL_CAPSULE | Freq: Once | ORAL | Status: AC
  Administered 2023-02-11: 25 mg via ORAL
  Filled 2023-02-11: qty 1

## 2023-02-11 MED ORDER — SODIUM CHLORIDE 0.9% IV SOLUTION
250.0000 mL | INTRAVENOUS | Status: DC
  Administered 2023-02-11: 100 mL via INTRAVENOUS

## 2023-02-11 MED ORDER — ACETAMINOPHEN 325 MG PO TABS
650.0000 mg | ORAL_TABLET | Freq: Once | ORAL | Status: AC
  Administered 2023-02-11: 650 mg via ORAL
  Filled 2023-02-11: qty 2

## 2023-02-11 NOTE — Patient Instructions (Signed)

## 2023-02-12 LAB — BPAM RBC
Blood Product Expiration Date: 202412092359
Blood Product Unit Number: 202412092359
ISSUE DATE / TIME: 202411131253
ISSUE DATE / TIME: 202411131253
Unit Type and Rh: 6200
Unit Type and Rh: 6200

## 2023-02-12 LAB — TYPE AND SCREEN
ABO/RH(D): AB POS
Antibody Screen: POSITIVE
Donor AG Type: NEGATIVE
Donor AG Type: NEGATIVE
Unit division: 0
Unit division: 0

## 2023-02-13 NOTE — Progress Notes (Unsigned)
Patient Care Team: [name], MD as PCP - General (Family Medicine)  Clinic Day:  02/16/2023  Referring physician: Si Gaul, MD  ASSESSMENT & PLAN:   Assessment & Plan: Multiple myeloma [facility]) Patient has been on a regimen of Cytoxan, Velcade, and Dexamethasone. Daratumumab has been added to the regimen.  Persistent iron deficiency anemia. Most recent blood transfusion done 02/11/2023. Reports improved energy after getting blood.  -today is Cycle 1 Day 15 of DaraCyBorD. He is generally tolerating well. Very small improvement in renal functions.  -will receive 1 unit pRBC tomorrow 02/17/2023 due to Hgb of 7.8 today. Only improved from 7.0 last week.  -Monitor blood counts and kidney function.   Plan: Labs reviewed  -CBC showing WBC 2.6; Hgb 7.8; Hct 23.8; Plt 117; Anc 2.0 -CMP - K 4.6; glucose 114; BUN 98 ; Creatinine 8.54; eGFR 7; Ca 7.7; LFTs normal -proceed with Cycle 1 Day 15 DaraCyBorD.  -transfuse 1 unit pRBCs tomorrow.  -repeat labs and Day 22 Cycle 1 on 02/23/2023 as scheduled. Treat anemia as indicated  -labs, follow up, and treatment as scheduled.  The patient understands the plans discussed today and is in agreement with them.  He knows to contact our office if he develops concerns prior to his next appointment.  I provided 30 minutes of face-to-face time during this encounter and > 50% was spent counseling as documented under my assessment and plan.    Carlean Jews, NP  [facility] [facility]  [facility] - A [facility] OF [facility] [address] [place] [address] Dept: [phone] Dept Fax: [phone]   Orders Placed This Encounter  Procedures   Informed Consent Details: Physician/Practitioner Attestation; Transcribe to consent form and obtain patient signature    Standing Status:   Future    Number of Occurrences:   1    Standing Expiration Date:   02/16/2024    Order Specific Question:    Physician/Practitioner attestation of informed consent for blood and or blood product transfusion    Answer:   I, the physician/practitioner, attest that I have discussed with the patient the benefits, risks, side effects, alternatives, likelihood of achieving goals and potential problems during recovery for the procedure that I have provided informed consent.    Order Specific Question:   Product(s)    Answer:   All Product(s)   Care order/instruction    Transfuse Parameters    Standing Status:   Future    Number of Occurrences:   1    Standing Expiration Date:   02/16/2024   Care order/instruction    Transfuse Parameters    Standing Status:   Future    Standing Expiration Date:   02/16/2024   Type and screen         Standing Status:   Future    Number of Occurrences:   1    Standing Expiration Date:   02/16/2024   Type and screen         Standing Status:   Future    Number of Occurrences:   1    Standing Expiration Date:   02/16/2024   Prepare RBC (crossmatch)    Standing Status:   Standing    Number of Occurrences:   1    Order Specific Question:   # of Units    Answer:   1 unit    Order Specific Question:   Transfusion Indications    Answer:   Hemoglobin 8  gm/dL or less and orthopedic or cardiac surgery or pre-existing cardiac condition    Order Specific Question:   Number of Units to Keep Ahead    Answer:   NO units ahead    Order Specific Question:   If emergent release call blood bank    Answer:   [facility] [phone]      CHIEF COMPLAINT:  CC: multiple myeloma   Current Treatment:  Systemic chemotherapy with subcutaneous Velcade 1.3 Mg/KG on days 1, 4, 8 and 11 every 3 weeks, cyclophosphamide 500 Mg/M2 IV on days 1 and 8 as well as Decadron 40 mg p.o. on weekly basis start with the first dose of Velcade. Daratumumab was added with hope to improve renal functions.   INTERVAL HISTORY:  Drew Cooper is here today for repeat clinical assessment.  Last saw Dr.  Arbutus Ped on 02/02/2023. Daratumumab was added to treatment to help improve kidney function.  Patient's creatinine has been persistently > 9. Today, is cycle 1 day 15. Continues to have peripheral neuropathy. Last needed blood transfusion on 02/11/2023. Has reported improvement after each transfusion. Today, he continues to be anemic with Hgb at 7.8. creatinine is 8.54. he feels fatigued with minimal exertion. He otherwise feels ok. He denies chest pain, chest pressure, or shortness of breath. He denies headaches or visual disturbances. He denies abdominal pain, nausea, vomiting, or changes in bowel or bladder habits.  He denies fevers or chills. His appetite is good. His weight has increased 9 pounds over last week .  I have reviewed the past medical history, past surgical history, social history and family history with the patient and they are unchanged from previous note.  ALLERGIES:  has No Known Allergies.  MEDICATIONS:  Current Outpatient Medications  Medication Sig Dispense Refill   acetaminophen (TYLENOL) 500 MG tablet Take 500 mg by mouth every 6 (six) hours as needed for moderate pain.     acyclovir (ZOVIRAX) 200 MG capsule Take 1 capsule (200 mg total) by mouth daily. 30 capsule 0   dexamethasone (DECADRON) 4 MG tablet Take 10 tablets by mouth once a week, skip 9/6 and take it 9/9 instead with treatment (Patient taking differently: Take 10 tablets by mouth once a week, skip 9/6 and take it 9/9 instead with treatment. PT STATES HE TOOK ON 12/05/22) 40 tablet 4   ferric citrate (AURYXIA) 1 GM 210 MG(Fe) tablet Take 2 tablets (420 mg total) by mouth 3 (three) times daily with meals. 180 tablet 0   gabapentin (NEURONTIN) 100 MG capsule Take 1 capsule (100 mg total) by mouth 3 (three) times daily. 90 capsule 1   lidocaine (LIDODERM) 5 % Place 1 patch onto the skin daily.     prochlorperazine (COMPAZINE) 10 MG tablet Take 1 tablet (10 mg total) by mouth every 6 (six) hours as needed. 30 tablet 2    sodium bicarbonate 650 MG tablet Take 1 tablet (650 mg total) by mouth 2 (two) times daily. 60 tablet 0   No current facility-administered medications for this visit.   Facility-Administered Medications Ordered in Other Visits  Medication Dose Route Frequency Provider Last Rate Last Admin   0.9 %  sodium chloride infusion   Intravenous Continuous Carlean Jews, NP   Stopped at 02/16/23 1404   dexamethasone (DECADRON) tablet 40 mg  40 mg Oral Once Si Gaul, MD        HISTORY OF PRESENT ILLNESS:   Oncology History  Multiple myeloma (HCC)  11/09/2022 Initial Diagnosis   Multiple  myeloma (HCC)   11/14/2022 - 01/26/2023 Chemotherapy   Patient is on Treatment Plan : MYELOMA NEWLY DIAGNOSED TRANSPLANT CANDIDATE Cyclophosphamide IV + Bortezomib SQ + Dexamethasone (CyBorD) q21d x 4 cycles     02/02/2023 -  Chemotherapy   Patient is on Treatment Plan : MYELOMA DaraCyBorD (Daratumumab SQ + Cyclophosphamide PO + Bortezomib SQ + Dexamethasone PO) q28d x 8 cycles / Daratumumab SQ q28d         REVIEW OF SYSTEMS:   Constitutional: Denies fevers, chills or abnormal weight loss. Has fatigue with minimal exertion.  Eyes: Denies blurriness of vision Ears, nose, mouth, throat, and face: Denies mucositis or sore throat Respiratory: Denies cough, dyspnea or wheezes Cardiovascular: Denies palpitation, chest discomfort or lower extremity swelling Gastrointestinal:  Denies nausea, heartburn or change in bowel habits Skin: Denies abnormal skin rashes Lymphatics: Denies new lymphadenopathy or easy bruising Neurological:Denies numbness, tingling or new weaknesses Behavioral/Psych: Mood is stable, no new changes  All other systems were reviewed with the patient and are negative.   VITALS:   Today's Vitals   02/16/23 1100 02/16/23 1109 02/16/23 1112  BP:  (!) 161/92 (!) 156/104  Pulse:  79   Resp:  16   Temp:  98.1 F (36.7 C)   TempSrc:  Temporal   SpO2:  100%   Weight:  193 lb 11.2 oz  (87.9 kg)   Height:  5\' 10"  (1.778 m)   PainSc: 0-No pain     Body mass index is 27.79 kg/m.   Wt Readings from Last 3 Encounters:  02/16/23 193 lb 11.2 oz (87.9 kg)  02/09/23 184 lb 4 oz (83.6 kg)  02/02/23 186 lb 1.6 oz (84.4 kg)    Body mass index is 27.79 kg/m.  Performance status (ECOG): 1 - Symptomatic but completely ambulatory  PHYSICAL EXAM:   GENERAL:alert, no distress and comfortable SKIN: skin color, texture, turgor are normal, no rashes or significant lesions EYES: normal, Conjunctiva are pink and non-injected, sclera clear OROPHARYNX:no exudate, no erythema and lips, buccal mucosa, and tongue normal  NECK: supple, thyroid normal size, non-tender, without nodularity LYMPH:  no palpable lymphadenopathy in the cervical, axillary or inguinal LUNGS: clear to auscultation and percussion with normal breathing effort HEART: regular rate & rhythm and no murmurs and no lower extremity edema ABDOMEN:abdomen soft, non-tender and normal bowel sounds Musculoskeletal:no cyanosis of digits and no clubbing  NEURO: alert & oriented x 3 with fluent speech, no focal motor/sensory deficits  LABORATORY DATA:  I have reviewed the data as listed    Component Value Date/Time   NA 137 02/16/2023 1024   K 4.6 02/16/2023 1024   CL 105 02/16/2023 1024   CO2 21 (L) 02/16/2023 1024   GLUCOSE 114 (H) 02/16/2023 1024   BUN 98 (H) 02/16/2023 1024   CREATININE 8.54 (HH) 02/16/2023 1024   CALCIUM 7.7 (L) 02/16/2023 1024   PROT 5.5 (L) 02/16/2023 1024   ALBUMIN 3.6 02/16/2023 1024   AST 18 02/16/2023 1024   ALT 12 02/16/2023 1024   ALKPHOS 97 02/16/2023 1024   BILITOT 0.4 02/16/2023 1024   GFRNONAA 7 (L) 02/16/2023 1024   GFRAA >90 05/03/2013 1750     Lab Results  Component Value Date   WBC 2.6 (L) 02/16/2023   NEUTROABS 2.0 02/16/2023   HGB 7.8 (L) 02/16/2023   HCT 23.8 (L) 02/16/2023   MCV 90.5 02/16/2023   PLT 117 (L) 02/16/2023

## 2023-02-15 NOTE — Assessment & Plan Note (Signed)
Patient has been on a regimen of Cytoxan, Velcade, and Dexamethasone. Daratumumab has been added to the regimen. Patient reports increased energy post blood transfusion. -Continue current regimen with addition of Daratumumab. -Monitor blood counts and kidney function.

## 2023-02-16 ENCOUNTER — Encounter: Payer: Self-pay | Admitting: Nurse Practitioner

## 2023-02-16 ENCOUNTER — Inpatient Hospital Stay: Payer: BC Managed Care – PPO

## 2023-02-16 ENCOUNTER — Other Ambulatory Visit: Payer: Self-pay

## 2023-02-16 ENCOUNTER — Encounter: Payer: Self-pay | Admitting: Medical Oncology

## 2023-02-16 ENCOUNTER — Inpatient Hospital Stay (HOSPITAL_BASED_OUTPATIENT_CLINIC_OR_DEPARTMENT_OTHER): Payer: BC Managed Care – PPO | Admitting: Nurse Practitioner

## 2023-02-16 VITALS — BP 156/104 | HR 79 | Temp 98.1°F | Resp 16 | Ht 70.0 in | Wt 193.7 lb

## 2023-02-16 DIAGNOSIS — C9000 Multiple myeloma not having achieved remission: Secondary | ICD-10-CM | POA: Diagnosis not present

## 2023-02-16 DIAGNOSIS — Z5112 Encounter for antineoplastic immunotherapy: Secondary | ICD-10-CM | POA: Diagnosis not present

## 2023-02-16 LAB — CBC WITH DIFFERENTIAL (CANCER CENTER ONLY)
Abs Immature Granulocytes: 0.02 10*3/uL (ref 0.00–0.07)
Basophils Absolute: 0.1 10*3/uL (ref 0.0–0.1)
Basophils Relative: 2 %
Eosinophils Absolute: 0.1 10*3/uL (ref 0.0–0.5)
Eosinophils Relative: 2 %
HCT: 23.8 % — ABNORMAL LOW (ref 39.0–52.0)
Hemoglobin: 7.8 g/dL — ABNORMAL LOW (ref 13.0–17.0)
Immature Granulocytes: 1 %
Lymphocytes Relative: 7 %
Lymphs Abs: 0.2 10*3/uL — ABNORMAL LOW (ref 0.7–4.0)
MCH: 29.7 pg (ref 26.0–34.0)
MCHC: 32.8 g/dL (ref 30.0–36.0)
MCV: 90.5 fL (ref 80.0–100.0)
Monocytes Absolute: 0.3 10*3/uL (ref 0.1–1.0)
Monocytes Relative: 10 %
Neutro Abs: 2 10*3/uL (ref 1.7–7.7)
Neutrophils Relative %: 78 %
Platelet Count: 117 10*3/uL — ABNORMAL LOW (ref 150–400)
RBC: 2.63 MIL/uL — ABNORMAL LOW (ref 4.22–5.81)
RDW: 13.9 % (ref 11.5–15.5)
WBC Count: 2.6 10*3/uL — ABNORMAL LOW (ref 4.0–10.5)
nRBC: 0 % (ref 0.0–0.2)

## 2023-02-16 LAB — CMP (CANCER CENTER ONLY)
ALT: 12 U/L (ref 0–44)
AST: 18 U/L (ref 15–41)
Albumin: 3.6 g/dL (ref 3.5–5.0)
Alkaline Phosphatase: 97 U/L (ref 38–126)
Anion gap: 11 (ref 5–15)
BUN: 98 mg/dL — ABNORMAL HIGH (ref 6–20)
CO2: 21 mmol/L — ABNORMAL LOW (ref 22–32)
Calcium: 7.7 mg/dL — ABNORMAL LOW (ref 8.9–10.3)
Chloride: 105 mmol/L (ref 98–111)
Creatinine: 8.54 mg/dL (ref 0.61–1.24)
GFR, Estimated: 7 mL/min — ABNORMAL LOW (ref >60)
Glucose, Bld: 114 mg/dL — ABNORMAL HIGH (ref 70–99)
Potassium: 4.6 mmol/L (ref 3.5–5.1)
Sodium: 137 mmol/L (ref 135–145)
Total Bilirubin: 0.4 mg/dL (ref <1.2)
Total Protein: 5.5 g/dL — ABNORMAL LOW (ref 6.5–8.1)

## 2023-02-16 LAB — PREPARE RBC (CROSSMATCH)

## 2023-02-16 MED ORDER — SODIUM CHLORIDE 0.9 % IV SOLN
500.0000 mg/m2 | Freq: Once | INTRAVENOUS | Status: AC
  Administered 2023-02-16: 1000 mg via INTRAVENOUS
  Filled 2023-02-16: qty 50

## 2023-02-16 MED ORDER — PALONOSETRON HCL INJECTION 0.25 MG/5ML
0.2500 mg | Freq: Once | INTRAVENOUS | Status: AC
  Administered 2023-02-16: 0.25 mg via INTRAVENOUS
  Filled 2023-02-16: qty 5

## 2023-02-16 MED ORDER — DEXAMETHASONE 4 MG PO TABS: 40.0000 mg | ORAL_TABLET | Freq: Once | ORAL | Status: DC

## 2023-02-16 MED ORDER — DIPHENHYDRAMINE HCL 25 MG PO CAPS
50.0000 mg | ORAL_CAPSULE | Freq: Once | ORAL | Status: AC
  Administered 2023-02-16: 50 mg via ORAL
  Filled 2023-02-16: qty 2

## 2023-02-16 MED ORDER — MONTELUKAST SODIUM 10 MG PO TABS
10.0000 mg | ORAL_TABLET | Freq: Once | ORAL | Status: AC
  Administered 2023-02-16: 10 mg via ORAL
  Filled 2023-02-16: qty 1

## 2023-02-16 MED ORDER — SODIUM CHLORIDE 0.9 % IV SOLN: INTRAVENOUS | Status: DC

## 2023-02-16 MED ORDER — BORTEZOMIB CHEMO SQ INJECTION 3.5 MG (2.5MG/ML)
1.5000 mg/m2 | Freq: Once | INTRAMUSCULAR | Status: AC
  Administered 2023-02-16: 3 mg via SUBCUTANEOUS
  Filled 2023-02-16: qty 1.2

## 2023-02-16 MED ORDER — ACETAMINOPHEN 325 MG PO TABS
650.0000 mg | ORAL_TABLET | Freq: Once | ORAL | Status: AC
  Administered 2023-02-16: 650 mg via ORAL
  Filled 2023-02-16: qty 2

## 2023-02-16 MED ORDER — DARATUMUMAB-HYALURONIDASE-FIHJ 1800-30000 MG-UT/15ML ~~LOC~~ SOLN
1800.0000 mg | Freq: Once | SUBCUTANEOUS | Status: AC
  Administered 2023-02-16: 1800 mg via SUBCUTANEOUS
  Filled 2023-02-16: qty 15

## 2023-02-16 NOTE — Progress Notes (Signed)
Per [name] NP, okay to treat with Hgb 7.8

## 2023-02-16 NOTE — Patient Instructions (Signed)
 [facility] - A DEPT OF [facility]  Discharge Instructions: Thank you for choosing [facility] to provide your oncology and hematology care.   If you have a lab appointment with the Cancer Center, please go directly to the Cancer Center and check in at the registration area.   Wear comfortable clothing and clothing appropriate for easy access to any Portacath or PICC line.   We strive to give you quality time with your provider. You may need to reschedule your appointment if you arrive late (15 or more minutes).  Arriving late affects you and other patients whose appointments are after yours.  Also, if you miss three or more appointments without notifying the office, you may be dismissed from the clinic at the provider's discretion.      For prescription refill requests, have your pharmacy contact our office and allow 72 hours for refills to be completed.    Today you received the following chemotherapy and/or immunotherapy agents: Velcade, Cytoxan, darzalex fastpro       To help prevent nausea and vomiting after your treatment, we encourage you to take your nausea medication as directed.  BELOW ARE SYMPTOMS THAT SHOULD BE REPORTED IMMEDIATELY: *FEVER GREATER THAN 100.4 F (38 C) OR HIGHER *CHILLS OR SWEATING *NAUSEA AND VOMITING THAT IS NOT CONTROLLED WITH YOUR NAUSEA MEDICATION *UNUSUAL SHORTNESS OF BREATH *UNUSUAL BRUISING OR BLEEDING *URINARY PROBLEMS (pain or burning when urinating, or frequent urination) *BOWEL PROBLEMS (unusual diarrhea, constipation, pain near the anus) TENDERNESS IN MOUTH AND THROAT WITH OR WITHOUT PRESENCE OF ULCERS (sore throat, sores in mouth, or a toothache) UNUSUAL RASH, SWELLING OR PAIN  UNUSUAL VAGINAL DISCHARGE OR ITCHING   Items with * indicate a potential emergency and should be followed up as soon as possible or go to the Emergency Department if any problems should occur.  Please show the CHEMOTHERAPY  ALERT CARD or IMMUNOTHERAPY ALERT CARD at check-in to the Emergency Department and triage nurse.  Should you have questions after your visit or need to cancel or reschedule your appointment, please contact [facility] - A DEPT OF [facility]  Dept: [phone]  and follow the prompts.  Office hours are 8:00 a.m. to 4:30 p.m. Monday - Friday. Please note that voicemails left after 4:00 p.m. may not be returned until the following business day.  We are closed weekends and major holidays. You have access to a nurse at all times for urgent questions. Please call the main number to the clinic Dept: [phone] and follow the prompts.   For any non-urgent questions, you may also contact your provider using MyChart. We now offer e-Visits for anyone 88 and older to request care online for non-urgent symptoms. For details visit [email or web address].   Also download the MyChart app! Go to the app store, search "MyChart", open the app, select [facility], and log in with your MyChart username and password.  Blood Transfusion, Adult, Care After After a blood transfusion, it is common to have: Bruising and soreness at the IV site. A headache. Follow these instructions at home: Your doctor may give you more instructions. If you have problems, contact your doctor. Insertion site care     Follow instructions from your doctor about how to take care of your insertion site. This is where an IV tube was put into your vein. Make sure you: Wash your hands with soap and water for at least 20 seconds before and after  you change your bandage. If you cannot use soap and water, use hand sanitizer. Change your bandage as told by your doctor. Check your insertion site every day for signs of infection. Check for: Redness, swelling, or pain. Bleeding from the site. Warmth. Pus or a bad smell. General instructions Take over-the-counter and prescription medicines only as told by your  doctor. Rest as told by your doctor. Go back to your normal activities as told by your doctor. Keep all follow-up visits. You may need to have tests at certain times to check your blood. Contact a doctor if: You have itching or red, swollen areas of skin (hives). You have a fever or chills. You have pain in the head, back, or chest. You feel worried or nervous (anxious). You feel weak after doing your normal activities. You have any of these problems at the insertion site: Redness, swelling, warmth, or pain. Bleeding that does not stop with pressure. Pus or a bad smell. If you received your blood transfusion in an outpatient setting, you will be told whom to contact to report any reactions. Get help right away if: You have signs of a serious reaction. This may be coming from an allergy or the body's defense system (immune system). Signs include: Trouble breathing or shortness of breath. Swelling of the face or feeling warm (flushed). A widespread rash. Dark pee (urine) or blood in the pee. Fast heartbeat. These symptoms may be an emergency. Get help right away. Call 911. Do not wait to see if the symptoms will go away. Do not drive yourself to the hospital. Summary Bruising and soreness at the IV site are common. Check your insertion site every day for signs of infection. Rest as told by your doctor. Go back to your normal activities as told by your doctor. Get help right away if you have signs of a serious reaction. This information is not intended to replace advice given to you by your health care provider. Make sure you discuss any questions you have with your health care provider. Document Revised: 06/14/2021 Document Reviewed: 06/14/2021 Elsevier Patient Education  2024 [employer].

## 2023-02-16 NOTE — Progress Notes (Signed)
CRITICAL VALUE STICKER  CRITICAL VALUE:Creatinine =8.54  RECEIVER (on-site recipient of call):Travez Stancil, RN  DATE & TIME NOTIFIED: 02/16/23 @ 1115  MESSENGER (representative from lab):Elizabeth  MD NOTIFIED: Arbutus Ped  TIME OF NOTIFICATION:1117  RESPONSE:  Per Dr. Arbutus Ped , it is okay to treat patient today with Cytoxan, velcade and Darzalex and creatinine =8.54.

## 2023-02-17 ENCOUNTER — Inpatient Hospital Stay: Payer: BC Managed Care – PPO

## 2023-02-17 DIAGNOSIS — Z5112 Encounter for antineoplastic immunotherapy: Secondary | ICD-10-CM | POA: Diagnosis not present

## 2023-02-17 DIAGNOSIS — C9000 Multiple myeloma not having achieved remission: Secondary | ICD-10-CM

## 2023-02-17 MED ORDER — SODIUM CHLORIDE 0.9% IV SOLUTION
250.0000 mL | INTRAVENOUS | Status: DC
  Administered 2023-02-17: 100 mL via INTRAVENOUS

## 2023-02-17 MED ORDER — DIPHENHYDRAMINE HCL 25 MG PO CAPS
25.0000 mg | ORAL_CAPSULE | Freq: Once | ORAL | Status: AC
Start: 2023-02-17 — End: 2023-02-17
  Administered 2023-02-17: 25 mg via ORAL

## 2023-02-17 MED ORDER — ACETAMINOPHEN 325 MG PO TABS
650.0000 mg | ORAL_TABLET | Freq: Once | ORAL | Status: AC
  Administered 2023-02-17: 650 mg via ORAL

## 2023-02-17 NOTE — Patient Instructions (Signed)

## 2023-02-18 LAB — BPAM RBC
Blood Product Expiration Date: 202412132359
ISSUE DATE / TIME: 202411191308
Unit Type and Rh: 6200

## 2023-02-18 LAB — TYPE AND SCREEN
ABO/RH(D): AB POS
Antibody Screen: POSITIVE
Donor AG Type: NEGATIVE
Unit division: 0

## 2023-02-20 ENCOUNTER — Encounter: Payer: Self-pay | Admitting: Internal Medicine

## 2023-02-20 ENCOUNTER — Other Ambulatory Visit: Payer: Self-pay | Admitting: Physician Assistant

## 2023-02-20 DIAGNOSIS — C9000 Multiple myeloma not having achieved remission: Secondary | ICD-10-CM

## 2023-02-23 ENCOUNTER — Encounter: Payer: Self-pay | Admitting: Medical Oncology

## 2023-02-23 ENCOUNTER — Encounter: Payer: Self-pay | Admitting: Internal Medicine

## 2023-02-23 ENCOUNTER — Inpatient Hospital Stay: Payer: BC Managed Care – PPO

## 2023-02-23 ENCOUNTER — Other Ambulatory Visit: Payer: Self-pay | Admitting: Physician Assistant

## 2023-02-23 VITALS — BP 142/98 | HR 80 | Temp 98.1°F | Resp 16

## 2023-02-23 DIAGNOSIS — C9000 Multiple myeloma not having achieved remission: Secondary | ICD-10-CM

## 2023-02-23 DIAGNOSIS — Z5112 Encounter for antineoplastic immunotherapy: Secondary | ICD-10-CM | POA: Diagnosis not present

## 2023-02-23 LAB — CMP (CANCER CENTER ONLY)
ALT: 13 U/L (ref 0–44)
AST: 19 U/L (ref 15–41)
Albumin: 3.6 g/dL (ref 3.5–5.0)
Alkaline Phosphatase: 90 U/L (ref 38–126)
Anion gap: 12 (ref 5–15)
BUN: 93 mg/dL — ABNORMAL HIGH (ref 6–20)
CO2: 22 mmol/L (ref 22–32)
Calcium: 7.4 mg/dL — ABNORMAL LOW (ref 8.9–10.3)
Chloride: 103 mmol/L (ref 98–111)
Creatinine: 8.49 mg/dL (ref 0.61–1.24)
GFR, Estimated: 7 mL/min — ABNORMAL LOW (ref >60)
Glucose, Bld: 98 mg/dL (ref 70–99)
Potassium: 4.2 mmol/L (ref 3.5–5.1)
Sodium: 137 mmol/L (ref 135–145)
Total Bilirubin: 0.3 mg/dL (ref <1.2)
Total Protein: 5.9 g/dL — ABNORMAL LOW (ref 6.5–8.1)

## 2023-02-23 LAB — CBC WITH DIFFERENTIAL (CANCER CENTER ONLY)
Abs Immature Granulocytes: 0.02 10*3/uL (ref 0.00–0.07)
Basophils Absolute: 0.1 10*3/uL (ref 0.0–0.1)
Basophils Relative: 4 %
Eosinophils Absolute: 0.1 10*3/uL (ref 0.0–0.5)
Eosinophils Relative: 3 %
HCT: 23.8 % — ABNORMAL LOW (ref 39.0–52.0)
Hemoglobin: 8 g/dL — ABNORMAL LOW (ref 13.0–17.0)
Immature Granulocytes: 1 %
Lymphocytes Relative: 9 %
Lymphs Abs: 0.2 10*3/uL — ABNORMAL LOW (ref 0.7–4.0)
MCH: 29.9 pg (ref 26.0–34.0)
MCHC: 33.6 g/dL (ref 30.0–36.0)
MCV: 88.8 fL (ref 80.0–100.0)
Monocytes Absolute: 0.2 10*3/uL (ref 0.1–1.0)
Monocytes Relative: 10 %
Neutro Abs: 1.8 10*3/uL (ref 1.7–7.7)
Neutrophils Relative %: 73 %
Platelet Count: 117 10*3/uL — ABNORMAL LOW (ref 150–400)
RBC: 2.68 MIL/uL — ABNORMAL LOW (ref 4.22–5.81)
RDW: 13.6 % (ref 11.5–15.5)
WBC Count: 2.4 10*3/uL — ABNORMAL LOW (ref 4.0–10.5)
nRBC: 0 % (ref 0.0–0.2)

## 2023-02-23 LAB — SAMPLE TO BLOOD BANK

## 2023-02-23 MED ORDER — DEXAMETHASONE 4 MG PO TABS
40.0000 mg | ORAL_TABLET | Freq: Once | ORAL | Status: AC
  Administered 2023-02-23: 40 mg via ORAL
  Filled 2023-02-23: qty 10

## 2023-02-23 MED ORDER — ACETAMINOPHEN 325 MG PO TABS
650.0000 mg | ORAL_TABLET | Freq: Once | ORAL | Status: AC
Start: 2023-02-23 — End: 2023-02-23
  Administered 2023-02-23: 650 mg via ORAL
  Filled 2023-02-23: qty 2

## 2023-02-23 MED ORDER — DIPHENHYDRAMINE HCL 25 MG PO CAPS
50.0000 mg | ORAL_CAPSULE | Freq: Once | ORAL | Status: AC
  Administered 2023-02-23: 50 mg via ORAL
  Filled 2023-02-23: qty 2

## 2023-02-23 MED ORDER — DARATUMUMAB-HYALURONIDASE-FIHJ 1800-30000 MG-UT/15ML ~~LOC~~ SOLN
1800.0000 mg | Freq: Once | SUBCUTANEOUS | Status: AC
  Administered 2023-02-23: 1800 mg via SUBCUTANEOUS
  Filled 2023-02-23: qty 15

## 2023-02-23 NOTE — Progress Notes (Signed)
CRITICAL VALUE STICKER  CRITICAL VALUE:Creatinine 8.49  RECEIVER (on-site recipient of call):Vick Filter  DATE & TIME NOTIFIED: 11/24 @ 1438  MESSENGER (representative from lab):[name]  MD NOTIFIED: Arbutus Ped  TIME OF NOTIFICATION:1455  RESPONSE:  CKD Proceed with treatment today

## 2023-02-23 NOTE — Progress Notes (Signed)
[facility] OFFICE PROGRESS NOTE  Drew Bender, MD [address] [place] [address]  DIAGNOSIS: Multiple myeloma with lytic bone lesions in addition to renal insufficiency and bone marrow biopsy and aspirate consistent with plasma cell neoplasm with 53% plasma cells. This was diagnosed in August 2024.   PRIOR THERAPY: Plasmapheresis status post ~4 treatments   CURRENT THERAPY: First dose of treatment given while inpatient with  subcutaneous Velcade 1.3 Mg/KG on days 1, 4, 8 and 11 every 3 weeks, cyclophosphamide 500 Mg/M2 IV on days 1 and 8 as well as Decadron 40 mg p.o. on weekly basis start with the first dose of Velcade. Dr. Arbutus Ped added daratumumab. He is here for day 22 cycle #1 with Daratumumab, cytoxan, velcade, and dexamethasone.   INTERVAL HISTORY: Drew Cooper 57 y.o. male returns to the clinic today for a follow-up visit accompanied by his wife.  The patient was last seen by one of my colleagues on 02/16/2023.  Dr. Arbutus Ped recently changed his treatment to include daratumumab.  The patient has not noticed any appreciable new adverse side effects.  He follows with Dr. Allena Katz from nephrology.  The patient states that they are going to do dialysis on him.  He is scheduled to see a vascular provider next week on 03/09/2023 and he states that he is expected to get a fistula placement that day.   Otherwise, the denies any major changes in his health since he was last seen.  His fatigue is stable.  He does state that he does feel a little bit depleted when he is anemic.  He has been struggling with anemia periodically requiring blood transfusions.  He reports easy bruising on his extremities but denies any abnormal bleeding such as nosebleeding, gum bleeding, hematemesis, melena, or hematochezia.  He denies any signs of infection including fever, chills, night sweats, sore throat, cough, shortness of breath, skin infections, dysuria, diarrhea, or abdominal pain.  He  denies any peripheral neuropathy. He denies any nausea, vomiting, diarrhea, or constipation.  He denies any unusual bone pain.  He is here today for evaluation and repeat blood work before going day 1 cycle #2.    MEDICAL HISTORY: Past Medical History:  Diagnosis Date   Cancer (HCC)     ALLERGIES:  has No Known Allergies.  MEDICATIONS:  Current Outpatient Medications  Medication Sig Dispense Refill   acetaminophen (TYLENOL) 500 MG tablet Take 500 mg by mouth every 6 (six) hours as needed for moderate pain.     acyclovir (ZOVIRAX) 200 MG capsule Take 1 capsule (200 mg total) by mouth daily. 30 capsule 0   dexamethasone (DECADRON) 4 MG tablet Take 10 tablets by mouth once a week, skip 9/6 and take it 9/9 instead with treatment (Patient taking differently: Take 10 tablets by mouth once a week, skip 9/6 and take it 9/9 instead with treatment. PT STATES HE TOOK ON 12/05/22) 40 tablet 4   ferric citrate (AURYXIA) 1 GM 210 MG(Fe) tablet Take 2 tablets (420 mg total) by mouth 3 (three) times daily with meals. 180 tablet 0   gabapentin (NEURONTIN) 100 MG capsule Take 1 capsule (100 mg total) by mouth 3 (three) times daily. 90 capsule 1   lidocaine (LIDODERM) 5 % Place 1 patch onto the skin daily.     prochlorperazine (COMPAZINE) 10 MG tablet Take 1 tablet (10 mg total) by mouth every 6 (six) hours as needed. 30 tablet 2   sodium bicarbonate 650 MG tablet Take 1  tablet (650 mg total) by mouth 2 (two) times daily. 60 tablet 0   No current facility-administered medications for this visit.   Facility-Administered Medications Ordered in Other Visits  Medication Dose Route Frequency Provider Last Rate Last Admin   acetaminophen (TYLENOL) tablet 650 mg  650 mg Oral Once Si Gaul, MD       bortezomib SQ (VELCADE) chemo injection (2.5mg /mL concentration) 3 mg  1.5 mg/m2 (Treatment Plan Recorded) Subcutaneous Once Si Gaul, MD       daratumumab-hyaluronidase-fihj [facility] FASPRO) 1800-30000  MG-UT/15ML chemo SQ injection 1,800 mg  1,800 mg Subcutaneous Once Si Gaul, MD       dexamethasone (DECADRON) tablet 40 mg  40 mg Oral Once Si Gaul, MD       diphenhydrAMINE (BENADRYL) capsule 50 mg  50 mg Oral Once Si Gaul, MD       palonosetron (ALOXI) injection 0.25 mg  0.25 mg Intravenous Once Si Gaul, MD        SURGICAL HISTORY:  Past Surgical History:  Procedure Laterality Date   IR FLUORO GUIDE CV LINE RIGHT  11/13/2022   IR US GUIDE VASC ACCESS RIGHT  11/13/2022   KNEE SURGERY      REVIEW OF SYSTEMS:   Review of Systems  Constitutional: Positive for fatigue. Negative for appetite change, chills, fever and unexpected weight change.  HENT: Negative for mouth sores, nosebleeds, sore throat and trouble swallowing.   Eyes: Negative for eye problems and icterus.  Respiratory: Negative for cough, hemoptysis, shortness of breath and wheezing.   Cardiovascular: Negative for chest pain and leg swelling.  Gastrointestinal: Negative for abdominal pain, constipation, diarrhea, nausea and vomiting.  Genitourinary: Negative for bladder incontinence, difficulty urinating, dysuria, frequency and hematuria.   Musculoskeletal: Positive for stable back pain. Negative for gait problem, neck pain and neck stiffness. Positive for hands "locking up"  Skin: Negative for itching and rash.  Neurological: Negative for dizziness, extremity weakness, gait problem, headaches, light-headedness and seizures.  Hematological: Negative for adenopathy. Does not bruise/bleed easily.  Psychiatric/Behavioral: Negative for confusion, depression and sleep disturbance. The patient is not nervous/anxious.     PHYSICAL EXAMINATION:  Blood pressure (!) 145/86, pulse 77, temperature 98.3 F (36.8 C), temperature source Temporal, resp. rate 16, height 5\' 10"  (1.778 m), weight 189 lb 9.6 oz (86 kg), SpO2 100%.  ECOG PERFORMANCE STATUS: 1  Physical Exam  Constitutional: Oriented to  person, place, and time and well-developed, well-nourished, and in no distress.  HENT:  Head: Normocephalic and atraumatic.  Mouth/Throat: Oropharynx is clear and moist. No oropharyngeal exudate.  Eyes: Conjunctivae are normal. Right eye exhibits no discharge. Left eye exhibits no discharge. No scleral icterus.  Neck: Normal range of motion. Neck supple.  Cardiovascular: Normal rate, regular rhythm, normal heart sounds and intact distal pulses.   Pulmonary/Chest: Effort normal and breath sounds normal. No respiratory distress. No wheezes. No rales.  Abdominal: Soft. Bowel sounds are normal. Exhibits no distension and no mass. There is no tenderness.  Musculoskeletal: Normal range of motion. Exhibits no edema.  Lymphadenopathy:    No cervical adenopathy.  Neurological: Alert and oriented to person, place, and time. Exhibits normal muscle tone. Gait normal. Coordination normal.  Skin: Skin is warm and dry. No rash noted. Not diaphoretic. No erythema. No pallor.  Psychiatric: Mood, memory and judgment normal.  Vitals reviewed.  LABORATORY DATA: Lab Results  Component Value Date   WBC 3.6 (L) 03/02/2023   HGB 7.8 (L) 03/02/2023   HCT 23.2 (L)  03/02/2023   MCV 90.6 03/02/2023   PLT 165 03/02/2023      Chemistry      Component Value Date/Time   NA 137 03/02/2023 0938   K 4.6 03/02/2023 0938   CL 105 03/02/2023 0938   CO2 22 03/02/2023 0938   BUN 89 (H) 03/02/2023 0938   CREATININE 8.16 (HH) 03/02/2023 0938      Component Value Date/Time   CALCIUM 8.1 (L) 03/02/2023 0938   ALKPHOS 89 03/02/2023 0938   AST 15 03/02/2023 0938   ALT 10 03/02/2023 0938   BILITOT 0.3 03/02/2023 0938       RADIOGRAPHIC STUDIES:  VAS [place] UPPER EXT VEIN MAPPING (PRE-OP AVF)  Result Date: 02/25/2023 UPPER EXTREMITY VEIN MAPPING Patient Name:  BARRET PALMISANO Rosevear  Date of Exam:   02/25/2023 Medical Rec #: [id number]              Accession #:    [id number] Date of Birth: 02/25/1966                Patient Gender: M Patient Age:   16 years Exam Location:  [facility] Procedure:      VAS [place] UPPER EXT VEIN MAPPING (PRE-OP AVF) Referring Phys: Lemar Livings --------------------------------------------------------------------------------  Indications: Pre-access. Performing Technologist: Thereasa Parkin RVT  Examination Guidelines: A complete evaluation includes B-mode imaging, spectral Doppler, color Doppler, and power Doppler as needed of all accessible portions of each vessel. Bilateral testing is considered an integral part of a complete examination. Limited examinations for reoccurring indications may be performed as noted. +-----------------+-------------+----------+--------+ Right Cephalic   Diameter (cm)Depth (cm)Findings +-----------------+-------------+----------+--------+ Shoulder             0.63                        +-----------------+-------------+----------+--------+ Prox upper arm       0.71                        +-----------------+-------------+----------+--------+ Mid upper arm        0.62                        +-----------------+-------------+----------+--------+ Dist upper arm       0.64                        +-----------------+-------------+----------+--------+ Antecubital fossa    0.83                        +-----------------+-------------+----------+--------+ Prox forearm         0.37                        +-----------------+-------------+----------+--------+ Mid forearm          0.32                        +-----------------+-------------+----------+--------+ Dist forearm         0.31                        +-----------------+-------------+----------+--------+ +-----------------+-------------+----------+--------+ Right Basilic    Diameter (cm)Depth (cm)Findings +-----------------+-------------+----------+--------+ Mid upper arm        0.49                         +-----------------+-------------+----------+--------+  Dist upper arm       0.59                        +-----------------+-------------+----------+--------+ Antecubital fossa    0.33                        +-----------------+-------------+----------+--------+ +-----------------+-------------+----------+--------+ Left Cephalic    Diameter (cm)Depth (cm)Findings +-----------------+-------------+----------+--------+ Shoulder             0.40                        +-----------------+-------------+----------+--------+ Prox upper arm       0.35                        +-----------------+-------------+----------+--------+ Mid upper arm        0.28                        +-----------------+-------------+----------+--------+ Dist upper arm       0.34                        +-----------------+-------------+----------+--------+ Antecubital fossa    0.43                        +-----------------+-------------+----------+--------+ Prox forearm         0.26                        +-----------------+-------------+----------+--------+ Mid forearm          0.22                        +-----------------+-------------+----------+--------+ Dist forearm         0.20                        +-----------------+-------------+----------+--------+ +-----------------+-------------+----------+--------+ Left Basilic     Diameter (cm)Depth (cm)Findings +-----------------+-------------+----------+--------+ Mid upper arm        0.43                        +-----------------+-------------+----------+--------+ Dist upper arm       0.35                        +-----------------+-------------+----------+--------+ Antecubital fossa    0.31                        +-----------------+-------------+----------+--------+ Summary: Right: Patent cephalic and baslic veins. Left: Patent cephalic and baslic veins. *See table(s) above for measurements and observations.  Diagnosing physician:  Lemar Livings MD Electronically signed by Lemar Livings MD on 02/25/2023 at 5:29:35 PM.    Final    VAS [place] UPPER EXTREMITY ARTERIAL DUPLEX  Result Date: 02/25/2023  UPPER EXTREMITY DUPLEX STUDY Patient Name:  KAP MILTIMORE Gee  Date of Exam:   02/25/2023 Medical Rec #: [id number]              Accession #:    [id number] Date of Birth: April 11, 1965               Patient Gender: M Patient Age:   21 years Exam Location:  [facility] Procedure:      VAS [place] UPPER  EXTREMITY ARTERIAL DUPLEX Referring Phys: Lemar Livings --------------------------------------------------------------------------------  Indications: Pre op HD access.  Performing Technologist: Thereasa Parkin RVT  Examination Guidelines: A complete evaluation includes B-mode imaging, spectral Doppler, color Doppler, and power Doppler as needed of all accessible portions of each vessel. Bilateral testing is considered an integral part of a complete examination. Limited examinations for reoccurring indications may be performed as noted.  Right Pre-Dialysis Findings: +-----------------------+----------+--------------------+---------+--------+ Location               PSV (cm/s)Intralum. Diam. (cm)Waveform Comments +-----------------------+----------+--------------------+---------+--------+ Brachial Antecub. fossa57        0.57                triphasic         +-----------------------+----------+--------------------+---------+--------+ Radial Art at Wrist    75        0.21                triphasic         +-----------------------+----------+--------------------+---------+--------+ Ulnar Art at Wrist     59        0.30                triphasic         +-----------------------+----------+--------------------+---------+--------+  Left Pre-Dialysis Findings: +-----------------------+----------+--------------------+---------+--------+ Location               PSV (cm/s)Intralum. Diam. (cm)Waveform Comments  +-----------------------+----------+--------------------+---------+--------+ Brachial Antecub. fossa55        0.48                triphasic         +-----------------------+----------+--------------------+---------+--------+ Radial Art at Wrist    46        0.20                triphasic         +-----------------------+----------+--------------------+---------+--------+ Ulnar Art at Wrist     54        0.26                triphasic         +-----------------------+----------+--------------------+---------+--------+  Summary:  Right: Patent brachial, radial, and ulnar arteries. Left: Patent brachial, radial, and ulnar arteries. *See table(s) above for measurements and observations. Electronically signed by Lemar Livings MD on 02/25/2023 at 5:29:20 PM.    Final      ASSESSMENT/PLAN:  This is a very pleasant 58 year old Caucasian male diagnosed with multiple myeloma with 53% plasma cells on bone marrow biopsy and aspirate.   The patient is here to establish care on an outpatient basis.  He was recently hospitalized in August 2020 for which showed his diagnosis of multiple myeloma.  While admitted to the hospital he underwent day 1 cycle 1.   He rstarted treatment with subcutaneous Velcade 1.3 Mg/KG on days 1, 4, 8 and 11 every 3 weeks, cyclophosphamide 500 Mg/M2 IV on days 1 and 8 as well as Decadron 40 mg p.o. on weekly basis start with the first dose of Velcade on 11/14/22.  In November 2024, daratumumab was added.   The patient is scheduled for fistula next week on Monday.  Therefore I will cancel day 8 of cycle #2.  I consulted with one of the other physicians in the practice who recommended that we could consider holding Cytoxan from day 1 cycle 2 today due to his upcoming fistula placement.  Therefore I removed Cytoxan from his care plan this week.  He will only proceed with Velcade and Darzalex.  His hemoglobin is 7.8  today.  We will try and arrange for 1 unit of blood to be  administered sometime this week.   He continues to have renal insuffiencey from hie myeloma. Ok to treat today with creatinine 8.16. Recommend that he proceed with cycle #1 day 22 today as scheduled.  Patient is expected to start dialysis at some point in the future.  Therefore if his dialysis conflicts with his appointments, we can change his treatment day if needed.  He will keep [place] posted.  It sounds like they are planning to do dialysis on Mondays, Wednesdays, and Fridays.   The patient was advised to call immediately if he has any concerning symptoms in the interval. The patient voices understanding of current disease status and treatment options and is in agreement with the current care plan. All questions were answered. The patient knows to call the clinic with any problems, questions or concerns. We can certainly see the patient much sooner if necessary       Orders Placed This Encounter  Procedures   Informed Consent Details: Physician/Practitioner Attestation; Transcribe to consent form and obtain patient signature    Standing Status:   Future    Standing Expiration Date:   03/01/2024    Order Specific Question:   Physician/Practitioner attestation of informed consent for blood and or blood product transfusion    Answer:   I, the physician/practitioner, attest that I have discussed with the patient the benefits, risks, side effects, alternatives, likelihood of achieving goals and potential problems during recovery for the procedure that I have provided informed consent.    Order Specific Question:   Product(s)    Answer:   All Product(s)   Care order/instruction    Transfuse Parameters    Standing Status:   Future    Standing Expiration Date:   03/01/2024   Type and screen         Standing Status:   Future    Number of Occurrences:   1    Standing Expiration Date:   03/01/2024   Prepare RBC (crossmatch)    Standing Status:   Standing    Number of Occurrences:   1    Order  Specific Question:   # of Units    Answer:   1 unit    Order Specific Question:   Transfusion Indications    Answer:   Hemoglobin 8 gm/dL or less and orthopedic or cardiac surgery or pre-existing cardiac condition    Order Specific Question:   Number of Units to Keep Ahead    Answer:   NO units ahead    Order Specific Question:   If emergent release call blood bank    Answer:   Not emergent release  The total time spent in the appointment was 20-29 minutes   Dionta Larke L Joellen Tullos, PA-C 03/02/23

## 2023-02-23 NOTE — Patient Instructions (Signed)
[facility] - A DEPT OF [facility]  Discharge Instructions: Thank you for choosing [facility] to provide your oncology and hematology care.   If you have a lab appointment with the Cancer Center, please go directly to the Cancer Center and check in at the registration area.   Wear comfortable clothing and clothing appropriate for easy access to any Portacath or PICC line.   We strive to give you quality time with your provider. You may need to reschedule your appointment if you arrive late (15 or more minutes).  Arriving late affects you and other patients whose appointments are after yours.  Also, if you miss three or more appointments without notifying the office, you may be dismissed from the clinic at the provider's discretion.      For prescription refill requests, have your pharmacy contact our office and allow 72 hours for refills to be completed.    Today you received the following chemotherapy and/or immunotherapy agents: Darzalex faspro      To help prevent nausea and vomiting after your treatment, we encourage you to take your nausea medication as directed.  BELOW ARE SYMPTOMS THAT SHOULD BE REPORTED IMMEDIATELY: *FEVER GREATER THAN 100.4 F (38 C) OR HIGHER *CHILLS OR SWEATING *NAUSEA AND VOMITING THAT IS NOT CONTROLLED WITH YOUR NAUSEA MEDICATION *UNUSUAL SHORTNESS OF BREATH *UNUSUAL BRUISING OR BLEEDING *URINARY PROBLEMS (pain or burning when urinating, or frequent urination) *BOWEL PROBLEMS (unusual diarrhea, constipation, pain near the anus) TENDERNESS IN MOUTH AND THROAT WITH OR WITHOUT PRESENCE OF ULCERS (sore throat, sores in mouth, or a toothache) UNUSUAL RASH, SWELLING OR PAIN  UNUSUAL VAGINAL DISCHARGE OR ITCHING   Items with * indicate a potential emergency and should be followed up as soon as possible or go to the Emergency Department if any problems should occur.  Please show the CHEMOTHERAPY ALERT CARD or  IMMUNOTHERAPY ALERT CARD at check-in to the Emergency Department and triage nurse.  Should you have questions after your visit or need to cancel or reschedule your appointment, please contact [facility] - A DEPT OF [facility]  Dept: [phone]  and follow the prompts.  Office hours are 8:00 a.m. to 4:30 p.m. Monday - Friday. Please note that voicemails left after 4:00 p.m. may not be returned until the following business day.  We are closed weekends and major holidays. You have access to a nurse at all times for urgent questions. Please call the main number to the clinic Dept: [phone] and follow the prompts.   For any non-urgent questions, you may also contact your provider using MyChart. We now offer e-Visits for anyone 62 and older to request care online for non-urgent symptoms. For details visit [email or web address].   Also download the MyChart app! Go to the app store, search "MyChart", open the app, select [facility], and log in with your MyChart username and password.

## 2023-02-23 NOTE — Progress Notes (Signed)
Per Dr Arbutus Ped, okay to treat with creatinie 8.49

## 2023-02-25 ENCOUNTER — Other Ambulatory Visit (HOSPITAL_COMMUNITY): Payer: Self-pay | Admitting: Vascular Surgery

## 2023-02-25 ENCOUNTER — Encounter: Payer: Self-pay | Admitting: Internal Medicine

## 2023-02-25 ENCOUNTER — Ambulatory Visit (INDEPENDENT_AMBULATORY_CARE_PROVIDER_SITE_OTHER)
Admission: RE | Admit: 2023-02-25 | Discharge: 2023-02-25 | Disposition: A | Discharge status: Home / Self Care | Payer: BC Managed Care – PPO | Source: Ambulatory Visit | Attending: Surgery | Admitting: Surgery

## 2023-02-25 ENCOUNTER — Ambulatory Visit (HOSPITAL_COMMUNITY)
Admission: RE | Admit: 2023-02-25 | Discharge: 2023-02-25 | Disposition: A | Discharge status: Home / Self Care | Payer: BC Managed Care – PPO | Source: Ambulatory Visit | Attending: Vascular Surgery | Admitting: Vascular Surgery

## 2023-02-25 DIAGNOSIS — N19 Unspecified kidney failure: Secondary | ICD-10-CM

## 2023-03-02 ENCOUNTER — Inpatient Hospital Stay (HOSPITAL_BASED_OUTPATIENT_CLINIC_OR_DEPARTMENT_OTHER): Payer: BC Managed Care – PPO | Admitting: Physician Assistant

## 2023-03-02 ENCOUNTER — Inpatient Hospital Stay: Payer: BC Managed Care – PPO

## 2023-03-02 ENCOUNTER — Inpatient Hospital Stay: Payer: BC Managed Care – PPO | Attending: Internal Medicine

## 2023-03-02 ENCOUNTER — Encounter: Payer: Self-pay | Admitting: Internal Medicine

## 2023-03-02 ENCOUNTER — Telehealth: Payer: Self-pay | Admitting: Medical Oncology

## 2023-03-02 VITALS — BP 145/86 | HR 77 | Temp 98.3°F | Resp 16 | Ht 70.0 in | Wt 189.6 lb

## 2023-03-02 DIAGNOSIS — C9000 Multiple myeloma not having achieved remission: Secondary | ICD-10-CM

## 2023-03-02 DIAGNOSIS — D631 Anemia in chronic kidney disease: Secondary | ICD-10-CM

## 2023-03-02 DIAGNOSIS — D649 Anemia, unspecified: Secondary | ICD-10-CM | POA: Insufficient documentation

## 2023-03-02 DIAGNOSIS — Z5112 Encounter for antineoplastic immunotherapy: Secondary | ICD-10-CM | POA: Diagnosis present

## 2023-03-02 DIAGNOSIS — N186 End stage renal disease: Secondary | ICD-10-CM | POA: Insufficient documentation

## 2023-03-02 DIAGNOSIS — Z992 Dependence on renal dialysis: Secondary | ICD-10-CM | POA: Diagnosis not present

## 2023-03-02 DIAGNOSIS — Z5111 Encounter for antineoplastic chemotherapy: Secondary | ICD-10-CM | POA: Insufficient documentation

## 2023-03-02 DIAGNOSIS — N184 Chronic kidney disease, stage 4 (severe): Secondary | ICD-10-CM | POA: Diagnosis not present

## 2023-03-02 LAB — CBC WITH DIFFERENTIAL (CANCER CENTER ONLY)
Abs Immature Granulocytes: 0.02 10*3/uL (ref 0.00–0.07)
Basophils Absolute: 0.1 10*3/uL (ref 0.0–0.1)
Basophils Relative: 2 %
Eosinophils Absolute: 0.1 10*3/uL (ref 0.0–0.5)
Eosinophils Relative: 3 %
HCT: 23.2 % — ABNORMAL LOW (ref 39.0–52.0)
Hemoglobin: 7.8 g/dL — ABNORMAL LOW (ref 13.0–17.0)
Immature Granulocytes: 1 %
Lymphocytes Relative: 6 %
Lymphs Abs: 0.2 10*3/uL — ABNORMAL LOW (ref 0.7–4.0)
MCH: 30.5 pg (ref 26.0–34.0)
MCHC: 33.6 g/dL (ref 30.0–36.0)
MCV: 90.6 fL (ref 80.0–100.0)
Monocytes Absolute: 0.6 10*3/uL (ref 0.1–1.0)
Monocytes Relative: 16 %
Neutro Abs: 2.6 10*3/uL (ref 1.7–7.7)
Neutrophils Relative %: 72 %
Platelet Count: 165 10*3/uL (ref 150–400)
RBC: 2.56 MIL/uL — ABNORMAL LOW (ref 4.22–5.81)
RDW: 13.9 % (ref 11.5–15.5)
WBC Count: 3.6 10*3/uL — ABNORMAL LOW (ref 4.0–10.5)
nRBC: 0 % (ref 0.0–0.2)

## 2023-03-02 LAB — CMP (CANCER CENTER ONLY)
ALT: 10 U/L (ref 0–44)
AST: 15 U/L (ref 15–41)
Albumin: 3.7 g/dL (ref 3.5–5.0)
Alkaline Phosphatase: 89 U/L (ref 38–126)
Anion gap: 10 (ref 5–15)
BUN: 89 mg/dL — ABNORMAL HIGH (ref 6–20)
CO2: 22 mmol/L (ref 22–32)
Calcium: 8.1 mg/dL — ABNORMAL LOW (ref 8.9–10.3)
Chloride: 105 mmol/L (ref 98–111)
Creatinine: 8.16 mg/dL (ref 0.61–1.24)
GFR, Estimated: 7 mL/min — ABNORMAL LOW (ref >60)
Glucose, Bld: 81 mg/dL (ref 70–99)
Potassium: 4.6 mmol/L (ref 3.5–5.1)
Sodium: 137 mmol/L (ref 135–145)
Total Bilirubin: 0.3 mg/dL (ref <1.2)
Total Protein: 6 g/dL — ABNORMAL LOW (ref 6.5–8.1)

## 2023-03-02 LAB — SAMPLE TO BLOOD BANK

## 2023-03-02 LAB — PREPARE RBC (CROSSMATCH)

## 2023-03-02 MED ORDER — DEXAMETHASONE 4 MG PO TABS
40.0000 mg | ORAL_TABLET | Freq: Once | ORAL | Status: AC
  Administered 2023-03-02: 40 mg via ORAL
  Filled 2023-03-02: qty 10

## 2023-03-02 MED ORDER — DARATUMUMAB-HYALURONIDASE-FIHJ 1800-30000 MG-UT/15ML ~~LOC~~ SOLN
1800.0000 mg | Freq: Once | SUBCUTANEOUS | Status: AC
  Administered 2023-03-02: 1800 mg via SUBCUTANEOUS
  Filled 2023-03-02: qty 15

## 2023-03-02 MED ORDER — DIPHENHYDRAMINE HCL 25 MG PO CAPS
50.0000 mg | ORAL_CAPSULE | Freq: Once | ORAL | Status: AC
  Administered 2023-03-02: 50 mg via ORAL
  Filled 2023-03-02: qty 2

## 2023-03-02 MED ORDER — PALONOSETRON HCL INJECTION 0.25 MG/5ML
0.2500 mg | Freq: Once | INTRAVENOUS | Status: DC
Start: 2023-03-02 — End: 2023-03-02

## 2023-03-02 MED ORDER — ACETAMINOPHEN 325 MG PO TABS
650.0000 mg | ORAL_TABLET | Freq: Once | ORAL | Status: AC
  Administered 2023-03-02: 650 mg via ORAL
  Filled 2023-03-02: qty 2

## 2023-03-02 MED ORDER — BORTEZOMIB CHEMO SQ INJECTION 3.5 MG (2.5MG/ML)
1.5000 mg/m2 | Freq: Once | INTRAMUSCULAR | Status: AC
  Administered 2023-03-02: 3 mg via SUBCUTANEOUS
  Filled 2023-03-02: qty 1.2

## 2023-03-02 NOTE — Progress Notes (Signed)
Will d/c aloxi today.  Pt will not receive cyclophosphamide.

## 2023-03-02 NOTE — Patient Instructions (Signed)
[facility] - A DEPT OF [facility]   Discharge Instructions: Thank you for choosing [facility] to provide your oncology and hematology care.   If you have a lab appointment with the Cancer Center, please go directly to the Cancer Center and check in at the registration area.   Wear comfortable clothing and clothing appropriate for easy access to any Portacath or PICC line.   We strive to give you quality time with your provider. You may need to reschedule your appointment if you arrive late (15 or more minutes).  Arriving late affects you and other patients whose appointments are after yours.  Also, if you miss three or more appointments without notifying the office, you may be dismissed from the clinic at the provider's discretion.      For prescription refill requests, have your pharmacy contact our office and allow 72 hours for refills to be completed.    Today you received the following chemotherapy and/or immunotherapy agents: Bortezomib (Velcade) and Daratumumab hyaluronidase (Darzalex faspro)      To help prevent nausea and vomiting after your treatment, we encourage you to take your nausea medication as directed.  BELOW ARE SYMPTOMS THAT SHOULD BE REPORTED IMMEDIATELY: *FEVER GREATER THAN 100.4 F (38 C) OR HIGHER *CHILLS OR SWEATING *NAUSEA AND VOMITING THAT IS NOT CONTROLLED WITH YOUR NAUSEA MEDICATION *UNUSUAL SHORTNESS OF BREATH *UNUSUAL BRUISING OR BLEEDING *URINARY PROBLEMS (pain or burning when urinating, or frequent urination) *BOWEL PROBLEMS (unusual diarrhea, constipation, pain near the anus) TENDERNESS IN MOUTH AND THROAT WITH OR WITHOUT PRESENCE OF ULCERS (sore throat, sores in mouth, or a toothache) UNUSUAL RASH, SWELLING OR PAIN  UNUSUAL VAGINAL DISCHARGE OR ITCHING   Items with * indicate a potential emergency and should be followed up as soon as possible or go to the Emergency Department if any problems should  occur.  Please show the CHEMOTHERAPY ALERT CARD or IMMUNOTHERAPY ALERT CARD at check-in to the Emergency Department and triage nurse.  Should you have questions after your visit or need to cancel or reschedule your appointment, please contact [facility] - [facility]  Dept: [phone]  and follow the prompts.  Office hours are 8:00 a.m. to 4:30 p.m. Monday - Friday. Please note that voicemails left after 4:00 p.m. may not be returned until the following business day.  We are closed weekends and major holidays. You have access to a nurse at all times for urgent questions. Please call the main number to the clinic Dept: [phone] and follow the prompts.   For any non-urgent questions, you may also contact your provider using MyChart. We now offer e-Visits for anyone 55 and older to request care online for non-urgent symptoms. For details visit [email or web address].   Also download the MyChart app! Go to the app store, search "MyChart", open the app, select [facility], and log in with your MyChart username and password.

## 2023-03-02 NOTE — Progress Notes (Signed)
Per [name], PA-C - okay to proceed with treatment with BUN of 89, creatinine of 8.16, and hemoglobin of 7.8.   Holding cytoxan today. Patient to be scheduled for blood transfusion later this week. Patient receiving dialysis M,W,F.

## 2023-03-02 NOTE — Telephone Encounter (Signed)
CRITICAL VALUE STICKER  CRITICAL VALUE:creatinine=8.16  RECEIVER (on-site recipient of call):Eudell Julian, RN  DATE & TIME NOTIFIED: 03/02/23 21041  MESSENGER (representative from lab):Melissa  MD NOTIFIED: Heilingoetter, Cassie   TIME OF NOTIFICATION:03/02/23@1055   RESPONSE:  Better . Pt has CKD and this result is expected.

## 2023-03-04 ENCOUNTER — Other Ambulatory Visit: Payer: Self-pay

## 2023-03-04 ENCOUNTER — Inpatient Hospital Stay: Payer: Self-pay

## 2023-03-04 DIAGNOSIS — Z5112 Encounter for antineoplastic immunotherapy: Secondary | ICD-10-CM | POA: Diagnosis not present

## 2023-03-04 DIAGNOSIS — C9000 Multiple myeloma not having achieved remission: Secondary | ICD-10-CM

## 2023-03-04 DIAGNOSIS — D631 Anemia in chronic kidney disease: Secondary | ICD-10-CM

## 2023-03-04 MED ORDER — SODIUM CHLORIDE 0.9% IV SOLUTION
250.0000 mL | INTRAVENOUS | Status: DC
  Administered 2023-03-04: 100 mL via INTRAVENOUS

## 2023-03-04 MED ORDER — DIPHENHYDRAMINE HCL 25 MG PO CAPS
25.0000 mg | ORAL_CAPSULE | Freq: Once | ORAL | Status: AC
  Administered 2023-03-04: 25 mg via ORAL
  Filled 2023-03-04: qty 1

## 2023-03-04 MED ORDER — FUROSEMIDE 10 MG/ML IJ SOLN
20.0000 mg | Freq: Once | INTRAMUSCULAR | Status: AC
  Administered 2023-03-04: 20 mg via INTRAVENOUS
  Filled 2023-03-04: qty 2

## 2023-03-04 MED ORDER — ACETAMINOPHEN 325 MG PO TABS
650.0000 mg | ORAL_TABLET | Freq: Once | ORAL | Status: AC
  Administered 2023-03-04: 650 mg via ORAL
  Filled 2023-03-04: qty 2

## 2023-03-04 NOTE — Patient Instructions (Signed)

## 2023-03-05 LAB — TYPE AND SCREEN
ABO/RH(D): AB POS
Antibody Screen: POSITIVE
Donor AG Type: NEGATIVE
Unit division: 0

## 2023-03-05 LAB — BPAM RBC
Blood Product Expiration Date: 202412132359
ISSUE DATE / TIME: 202412041008
Unit Type and Rh: 202412132359
Unit Type and Rh: 6200

## 2023-03-06 ENCOUNTER — Other Ambulatory Visit: Payer: Self-pay

## 2023-03-09 ENCOUNTER — Ambulatory Visit: Payer: BC Managed Care – PPO

## 2023-03-09 ENCOUNTER — Encounter: Payer: Self-pay | Admitting: Surgery

## 2023-03-09 ENCOUNTER — Other Ambulatory Visit: Payer: BC Managed Care – PPO

## 2023-03-09 ENCOUNTER — Other Ambulatory Visit: Payer: Self-pay

## 2023-03-09 ENCOUNTER — Ambulatory Visit (INDEPENDENT_AMBULATORY_CARE_PROVIDER_SITE_OTHER): Payer: Self-pay | Admitting: Surgery

## 2023-03-09 VITALS — BP 151/96 | HR 89 | Temp 97.8°F | Resp 20 | Ht 70.0 in | Wt 186.0 lb

## 2023-03-09 DIAGNOSIS — N186 End stage renal disease: Secondary | ICD-10-CM | POA: Diagnosis not present

## 2023-03-09 DIAGNOSIS — Z992 Dependence on renal dialysis: Secondary | ICD-10-CM

## 2023-03-09 NOTE — H&P (View-Only) (Signed)
 [facility]  Patient name: Drew Cooper MRN: [id number] DOB: 10-Feb-1966 Sex: male   REQUESTING PROVIDER:    Dr. Allena Katz   REASON FOR CONSULT:    Dialysis excess  HISTORY OF PRESENT ILLNESS:   Drew Cooper is a 57 y.o. male, who is referred for dialysis access.  He is right-handed.  He does not have a pacemaker or defibrillator on the left side.  He developed progressively worsening renal dysfunction when undergoing treatment for multiple myeloma.  He has a right sided port in place.  He has undergone chemotherapy.  PAST MEDICAL HISTORY    Past Medical History:  Diagnosis Date   Cancer (HCC)      FAMILY HISTORY   Family History  Problem Relation Age of Onset   Hypertension Mother    Heart failure Mother    Diabetes Mother     SOCIAL HISTORY:   Social History   Socioeconomic History   Marital status: Married    Spouse name: Not on file   Number of children: Not on file   Years of education: Not on file   Highest education level: Not on file  Occupational History   Not on file  Tobacco Use   Smoking status: Former    Types: Cigarettes    Start date: 1986    Passive exposure: Past   Smokeless tobacco: Never  Vaping Use   Vaping status: Never Used  Substance and Sexual Activity   Alcohol use: Yes    Comment: 2 beers per week   Drug use: No   Sexual activity: Not on file  Other Topics Concern   Not on file  Social History Narrative   Not on file   Social Determinants of Health   Financial Resource Strain: High Risk (11/17/2022)   Overall Financial Resource Strain (CARDIA)    Difficulty of Paying Living Expenses: Very hard  Food Insecurity: No Food Insecurity (11/12/2022)   Hunger Vital Sign    Worried About Running Out of Food in the Last Year: Never true    Ran Out of Food in the Last Year: Never true  Transportation Needs: No Transportation Needs (11/12/2022)   PRAPARE  - Administrator, Civil Service (Medical): No    Lack of Transportation (Non-Medical): No  Physical Activity: Not on file  Stress: Not on file  Social Connections: Not on file  Intimate Partner Violence: Not At Risk (11/12/2022)   Humiliation, Afraid, Rape, and Kick questionnaire    Fear of Current or Ex-Partner: No    Emotionally Abused: No    Physically Abused: No    Sexually Abused: No    ALLERGIES:    No Known Allergies  CURRENT MEDICATIONS:    Current Outpatient Medications  Medication Sig Dispense Refill   acetaminophen (TYLENOL) 500 MG tablet Take 500 mg by mouth every 6 (six) hours as needed for moderate pain.     acyclovir (ZOVIRAX) 200 MG capsule Take 1 capsule (200 mg total) by mouth daily. 30 capsule 0   dexamethasone (DECADRON) 4 MG tablet Take 10 tablets by mouth once a week, skip 9/6 and take it 9/9 instead with treatment (Patient taking differently: Take 10 tablets by mouth once a week, skip 9/6 and take it 9/9 instead with treatment. PT STATES HE TOOK ON 12/05/22) 40 tablet 4   ferric citrate (AURYXIA) 1 GM 210 MG(Fe) tablet Take 2 tablets (420 mg total) by mouth 3 (three) times daily with  meals. 180 tablet 0   gabapentin (NEURONTIN) 100 MG capsule Take 1 capsule (100 mg total) by mouth 3 (three) times daily. 90 capsule 1   lidocaine (LIDODERM) 5 % Place 1 patch onto the skin daily.     prochlorperazine (COMPAZINE) 10 MG tablet Take 1 tablet (10 mg total) by mouth every 6 (six) hours as needed. 30 tablet 2   sodium bicarbonate 650 MG tablet Take 1 tablet (650 mg total) by mouth 2 (two) times daily. 60 tablet 0   No current facility-administered medications for this visit.    REVIEW OF SYSTEMS:   [X]  denotes positive finding, [ ]  denotes negative finding Cardiac  Comments:  Chest pain or chest pressure:    Shortness of breath upon exertion:    Short of breath when lying flat:    Irregular heart rhythm:        Vascular    Pain in calf, thigh, or  hip brought on by ambulation:    Pain in feet at night that wakes you up from your sleep:     Blood clot in your veins:    Leg swelling:         Pulmonary    Oxygen at home:    Productive cough:     Wheezing:         Neurologic    Sudden weakness in arms or legs:     Sudden numbness in arms or legs:     Sudden onset of difficulty speaking or slurred speech:    Temporary loss of vision in one eye:     Problems with dizziness:         Gastrointestinal    Blood in stool:      Vomited blood:         Genitourinary    Burning when urinating:     Blood in urine:        Psychiatric    Major depression:         Hematologic    Bleeding problems:    Problems with blood clotting too easily:        Skin    Rashes or ulcers:        Constitutional    Fever or chills:     PHYSICAL EXAM:   There were no vitals filed for this visit.  GENERAL: The patient is a well-nourished male, in no acute distress. The vital signs are documented above. CARDIAC: There is a regular rate and rhythm.  VASCULAR: Palpable left radial pulse PULMONARY: Nonlabored respirations MUSCULOSKELETAL: There are no major deformities or cyanosis. NEUROLOGIC: No focal weakness or paresthesias are detected. SKIN: There are no ulcers or rashes noted. PSYCHIATRIC: The patient has a normal affect.  STUDIES:   I have reviewed the following: +-----------------+-------------+----------+--------+  Right Cephalic   Diameter (cm)Depth (cm)Findings  +-----------------+-------------+----------+--------+  Shoulder            0.63                         +-----------------+-------------+----------+--------+  Prox upper arm       0.71                         +-----------------+-------------+----------+--------+  Mid upper arm        0.62                         +-----------------+-------------+----------+--------+  Dist upper arm       0.64                          +-----------------+-------------+----------+--------+  Antecubital fossa    0.83                         +-----------------+-------------+----------+--------+  Prox forearm         0.37                         +-----------------+-------------+----------+--------+  Mid forearm          0.32                         +-----------------+-------------+----------+--------+  Dist forearm         0.31                         +-----------------+-------------+----------+--------+   +-----------------+-------------+----------+--------+  Right Basilic    Diameter (cm)Depth (cm)Findings  +-----------------+-------------+----------+--------+  Mid upper arm        0.49                         +-----------------+-------------+----------+--------+  Dist upper arm       0.59                         +-----------------+-------------+----------+--------+  Antecubital fossa    0.33                         +-----------------+-------------+----------+--------+   +-----------------+-------------+----------+--------+  Left Cephalic    Diameter (cm)Depth (cm)Findings  +-----------------+-------------+----------+--------+  Shoulder            0.40                         +-----------------+-------------+----------+--------+  Prox upper arm       0.35                         +-----------------+-------------+----------+--------+  Mid upper arm        0.28                         +-----------------+-------------+----------+--------+  Dist upper arm       0.34                         +-----------------+-------------+----------+--------+  Antecubital fossa    0.43                         +-----------------+-------------+----------+--------+  Prox forearm         0.26                         +-----------------+-------------+----------+--------+  Mid forearm          0.22                          +-----------------+-------------+----------+--------+  Dist forearm         0.20                         +-----------------+-------------+----------+--------+   +-----------------+-------------+----------+--------+  Left Basilic     Diameter (cm)Depth (cm)Findings  +-----------------+-------------+----------+--------+  Mid upper arm        0.43                         +-----------------+-------------+----------+--------+  Dist upper arm       0.35                         +-----------------+-------------+----------+--------+  Antecubital fossa    0.31                         +-----------------+-------------+----------+--------  Normal arterial study ASSESSMENT and PLAN   CKD 5: We discussed proceeding with left arm fistula.  This will likely be a brachiocephalic fistula.  I talked about the risk of not maturity, the need for a second operation, and the risk of steal syndrome.  All of his questions were answered.  Will try to get this done in the near future.  His creatinine is 8.1, I catheter has not been requested   Charlena Cross, MD, FACS [facility] [phone] Pager [phone]

## 2023-03-09 NOTE — Progress Notes (Signed)
[facility]  Patient name: Drew Cooper MRN: [id number] DOB: 10-Feb-1966 Sex: male   REQUESTING PROVIDER:    Dr. Allena Katz   REASON FOR CONSULT:    Dialysis excess  HISTORY OF PRESENT ILLNESS:   Drew Cooper is a 57 y.o. male, who is referred for dialysis access.  He is right-handed.  He does not have a pacemaker or defibrillator on the left side.  He developed progressively worsening renal dysfunction when undergoing treatment for multiple myeloma.  He has a right sided port in place.  He has undergone chemotherapy.  PAST MEDICAL HISTORY    Past Medical History:  Diagnosis Date   Cancer (HCC)      FAMILY HISTORY   Family History  Problem Relation Age of Onset   Hypertension Mother    Heart failure Mother    Diabetes Mother     SOCIAL HISTORY:   Social History   Socioeconomic History   Marital status: Married    Spouse name: Not on file   Number of children: Not on file   Years of education: Not on file   Highest education level: Not on file  Occupational History   Not on file  Tobacco Use   Smoking status: Former    Types: Cigarettes    Start date: 1986    Passive exposure: Past   Smokeless tobacco: Never  Vaping Use   Vaping status: Never Used  Substance and Sexual Activity   Alcohol use: Yes    Comment: 2 beers per week   Drug use: No   Sexual activity: Not on file  Other Topics Concern   Not on file  Social History Narrative   Not on file   Social Determinants of Health   Financial Resource Strain: High Risk (11/17/2022)   Overall Financial Resource Strain (CARDIA)    Difficulty of Paying Living Expenses: Very hard  Food Insecurity: No Food Insecurity (11/12/2022)   Hunger Vital Sign    Worried About Running Out of Food in the Last Year: Never true    Ran Out of Food in the Last Year: Never true  Transportation Needs: No Transportation Needs (11/12/2022)   PRAPARE  - Administrator, Civil Service (Medical): No    Lack of Transportation (Non-Medical): No  Physical Activity: Not on file  Stress: Not on file  Social Connections: Not on file  Intimate Partner Violence: Not At Risk (11/12/2022)   Humiliation, Afraid, Rape, and Kick questionnaire    Fear of Current or Ex-Partner: No    Emotionally Abused: No    Physically Abused: No    Sexually Abused: No    ALLERGIES:    No Known Allergies  CURRENT MEDICATIONS:    Current Outpatient Medications  Medication Sig Dispense Refill   acetaminophen (TYLENOL) 500 MG tablet Take 500 mg by mouth every 6 (six) hours as needed for moderate pain.     acyclovir (ZOVIRAX) 200 MG capsule Take 1 capsule (200 mg total) by mouth daily. 30 capsule 0   dexamethasone (DECADRON) 4 MG tablet Take 10 tablets by mouth once a week, skip 9/6 and take it 9/9 instead with treatment (Patient taking differently: Take 10 tablets by mouth once a week, skip 9/6 and take it 9/9 instead with treatment. PT STATES HE TOOK ON 12/05/22) 40 tablet 4   ferric citrate (AURYXIA) 1 GM 210 MG(Fe) tablet Take 2 tablets (420 mg total) by mouth 3 (three) times daily with  meals. 180 tablet 0   gabapentin (NEURONTIN) 100 MG capsule Take 1 capsule (100 mg total) by mouth 3 (three) times daily. 90 capsule 1   lidocaine (LIDODERM) 5 % Place 1 patch onto the skin daily.     prochlorperazine (COMPAZINE) 10 MG tablet Take 1 tablet (10 mg total) by mouth every 6 (six) hours as needed. 30 tablet 2   sodium bicarbonate 650 MG tablet Take 1 tablet (650 mg total) by mouth 2 (two) times daily. 60 tablet 0   No current facility-administered medications for this visit.    REVIEW OF SYSTEMS:   [X]  denotes positive finding, [ ]  denotes negative finding Cardiac  Comments:  Chest pain or chest pressure:    Shortness of breath upon exertion:    Short of breath when lying flat:    Irregular heart rhythm:        Vascular    Pain in calf, thigh, or  hip brought on by ambulation:    Pain in feet at night that wakes you up from your sleep:     Blood clot in your veins:    Leg swelling:         Pulmonary    Oxygen at home:    Productive cough:     Wheezing:         Neurologic    Sudden weakness in arms or legs:     Sudden numbness in arms or legs:     Sudden onset of difficulty speaking or slurred speech:    Temporary loss of vision in one eye:     Problems with dizziness:         Gastrointestinal    Blood in stool:      Vomited blood:         Genitourinary    Burning when urinating:     Blood in urine:        Psychiatric    Major depression:         Hematologic    Bleeding problems:    Problems with blood clotting too easily:        Skin    Rashes or ulcers:        Constitutional    Fever or chills:     PHYSICAL EXAM:   There were no vitals filed for this visit.  GENERAL: The patient is a well-nourished male, in no acute distress. The vital signs are documented above. CARDIAC: There is a regular rate and rhythm.  VASCULAR: Palpable left radial pulse PULMONARY: Nonlabored respirations MUSCULOSKELETAL: There are no major deformities or cyanosis. NEUROLOGIC: No focal weakness or paresthesias are detected. SKIN: There are no ulcers or rashes noted. PSYCHIATRIC: The patient has a normal affect.  STUDIES:   I have reviewed the following: +-----------------+-------------+----------+--------+  Right Cephalic   Diameter (cm)Depth (cm)Findings  +-----------------+-------------+----------+--------+  Shoulder            0.63                         +-----------------+-------------+----------+--------+  Prox upper arm       0.71                         +-----------------+-------------+----------+--------+  Mid upper arm        0.62                         +-----------------+-------------+----------+--------+  Dist upper arm       0.64                          +-----------------+-------------+----------+--------+  Antecubital fossa    0.83                         +-----------------+-------------+----------+--------+  Prox forearm         0.37                         +-----------------+-------------+----------+--------+  Mid forearm          0.32                         +-----------------+-------------+----------+--------+  Dist forearm         0.31                         +-----------------+-------------+----------+--------+   +-----------------+-------------+----------+--------+  Right Basilic    Diameter (cm)Depth (cm)Findings  +-----------------+-------------+----------+--------+  Mid upper arm        0.49                         +-----------------+-------------+----------+--------+  Dist upper arm       0.59                         +-----------------+-------------+----------+--------+  Antecubital fossa    0.33                         +-----------------+-------------+----------+--------+   +-----------------+-------------+----------+--------+  Left Cephalic    Diameter (cm)Depth (cm)Findings  +-----------------+-------------+----------+--------+  Shoulder            0.40                         +-----------------+-------------+----------+--------+  Prox upper arm       0.35                         +-----------------+-------------+----------+--------+  Mid upper arm        0.28                         +-----------------+-------------+----------+--------+  Dist upper arm       0.34                         +-----------------+-------------+----------+--------+  Antecubital fossa    0.43                         +-----------------+-------------+----------+--------+  Prox forearm         0.26                         +-----------------+-------------+----------+--------+  Mid forearm          0.22                          +-----------------+-------------+----------+--------+  Dist forearm         0.20                         +-----------------+-------------+----------+--------+   +-----------------+-------------+----------+--------+  Left Basilic     Diameter (cm)Depth (cm)Findings  +-----------------+-------------+----------+--------+  Mid upper arm        0.43                         +-----------------+-------------+----------+--------+  Dist upper arm       0.35                         +-----------------+-------------+----------+--------+  Antecubital fossa    0.31                         +-----------------+-------------+----------+--------  Normal arterial study ASSESSMENT and PLAN   CKD 5: We discussed proceeding with left arm fistula.  This will likely be a brachiocephalic fistula.  I talked about the risk of not maturity, the need for a second operation, and the risk of steal syndrome.  All of his questions were answered.  Will try to get this done in the near future.  His creatinine is 8.1, I catheter has not been requested   Charlena Cross, MD, FACS [facility] [phone] Pager [phone]

## 2023-03-11 ENCOUNTER — Other Ambulatory Visit: Payer: Self-pay

## 2023-03-11 ENCOUNTER — Telehealth: Payer: Self-pay

## 2023-03-11 DIAGNOSIS — N186 End stage renal disease: Secondary | ICD-10-CM

## 2023-03-11 NOTE — Telephone Encounter (Signed)
Attempted to reach patient to schedule left arm fistula creation. Left VM for patient to return call.

## 2023-03-12 NOTE — Progress Notes (Signed)
[facility] OFFICE PROGRESS NOTE  [name], MD [address] [place] [address]  DIAGNOSIS: Multiple myeloma with lytic bone lesions in addition to renal insufficiency and bone marrow biopsy and aspirate consistent with plasma cell neoplasm with 53% plasma cells. This was diagnosed in August 2024.   PRIOR THERAPY:  Plasmapheresis status post ~4 treatments   CURRENT THERAPY: First dose of treatment given while inpatient with  subcutaneous Velcade 1.3 Mg/KG on days 1, 4, 8 and 11 every 3 weeks, cyclophosphamide 500 Mg/M2 IV on days 1 and 8 as well as Decadron 40 mg p.o. on weekly basis start with the first dose of Velcade. Dr. Arbutus Ped added daratumumab. He is here for day 15 cycle #2 with Daratumumab, cytoxan, velcade, and dexamethasone.   INTERVAL HISTORY: USAMA BOSHERS 57 y.o. male returns to the clinic today for follow-up visit. The patient was last seen by myself on 03/02/2023.  At that point in time, we held his Cytoxan in preparation for surgery for fistula and his treatment was canceled the week of 03/09/2023.  However, the patient saw vascular surgery for consultation the procedure for the actual fistula is not scheduled until 03/26/2023.  The patient tells me that he is expected to start dialysis tomorrow.  He is going to have dialysis on Tuesdays, Thursdays, and Saturdays.  Overall he is feeling fair today.  His fatigue is "okay". Otherwise, the denies any major changes in his health since he was last seen.  He has been struggling with anemia periodically requiring blood transfusions.  He reports easy bruising on his extremities but denies any abnormal bleeding such as nosebleeding, gum bleeding, hematemesis, melena, or hematochezia.  He denies any signs of infection including fever, chills, night sweats, sore throat, cough, shortness of breath, skin infections, dysuria, diarrhea, or abdominal pain.  He denies any peripheral neuropathy but continues to have some  stiffness in his hands. He denies any nausea, vomiting, diarrhea, or constipation.  He denies any unusual bone pain.  He is here today for evaluation and repeat blood work before going day 15 cycle #2.    Past Medical History:  Diagnosis Date   Cancer (HCC)    Chronic kidney disease     ALLERGIES:  has no known allergies.  MEDICATIONS:  Current Outpatient Medications  Medication Sig Dispense Refill   acetaminophen (TYLENOL) 500 MG tablet Take 500 mg by mouth every 6 (six) hours as needed for moderate pain.     acyclovir (ZOVIRAX) 200 MG capsule Take 1 capsule (200 mg total) by mouth daily. 30 capsule 0   dexamethasone (DECADRON) 4 MG tablet Take 10 tablets by mouth once a week, skip 9/6 and take it 9/9 instead with treatment (Patient taking differently: Take 10 tablets by mouth once a week, skip 9/6 and take it 9/9 instead with treatment. PT STATES HE TOOK ON 12/05/22) 40 tablet 4   ferric citrate (AURYXIA) 1 GM 210 MG(Fe) tablet Take 2 tablets (420 mg total) by mouth 3 (three) times daily with meals. 180 tablet 0   gabapentin (NEURONTIN) 100 MG capsule Take 1 capsule (100 mg total) by mouth 3 (three) times daily. 90 capsule 1   lidocaine (LIDODERM) 5 % Place 1 patch onto the skin daily.     prochlorperazine (COMPAZINE) 10 MG tablet Take 1 tablet (10 mg total) by mouth every 6 (six) hours as needed. 30 tablet 2   sodium bicarbonate 650 MG tablet Take 1 tablet (650 mg total) by mouth  2 (two) times daily. 60 tablet 0   No current facility-administered medications for this visit.    SURGICAL HISTORY:  Past Surgical History:  Procedure Laterality Date   IR FLUORO GUIDE CV LINE RIGHT  11/13/2022   IR US GUIDE VASC ACCESS RIGHT  11/13/2022   KNEE SURGERY      REVIEW OF SYSTEMS:   Constitutional: Positive for stable fatigue. Negative for appetite change, chills, fever and unexpected weight change.  HENT: Negative for mouth sores, nosebleeds, sore throat and trouble swallowing.   Eyes:  Negative for eye problems and icterus.  Respiratory: Negative for cough, hemoptysis, shortness of breath and wheezing.   Cardiovascular: Negative for chest pain and leg swelling.  Gastrointestinal: Negative for abdominal pain, constipation, diarrhea, nausea and vomiting.  Genitourinary: Negative for bladder incontinence, difficulty urinating, dysuria, frequency and hematuria.   Musculoskeletal: Positive for stable back pain. Negative for gait problem, neck pain and neck stiffness. Positive for hands "locking up"  Skin: Negative for itching and rash.  Neurological: Negative for dizziness, extremity weakness, gait problem, headaches, light-headedness and seizures.  Hematological: Negative for adenopathy. Does not bruise/bleed easily.  Psychiatric/Behavioral: Negative for confusion, depression and sleep disturbance. The patient is not nervous/anxious.     PHYSICAL EXAMINATION:  Blood pressure (!) 148/90, pulse 89, temperature 98.2 F (36.8 C), temperature source Temporal, resp. rate 17, weight 188 lb (85.3 kg), SpO2 100%.  ECOG PERFORMANCE STATUS: 1  Physical Exam  Constitutional: Oriented to person, place, and time and well-developed, well-nourished, and in no distress.  HENT:  Head: Normocephalic and atraumatic.  Mouth/Throat: Oropharynx is clear and moist. No oropharyngeal exudate.  Eyes: Conjunctivae are normal. Right eye exhibits no discharge. Left eye exhibits no discharge. No scleral icterus.  Neck: Normal range of motion. Neck supple.  Cardiovascular: Normal rate, regular rhythm, normal heart sounds and intact distal pulses.   Pulmonary/Chest: Effort normal and breath sounds normal. No respiratory distress. No wheezes. No rales.  Abdominal: Soft. Bowel sounds are normal. Exhibits no distension and no mass. There is no tenderness.  Musculoskeletal: Normal range of motion. Exhibits no edema.  Lymphadenopathy:    No cervical adenopathy.  Neurological: Alert and oriented to person,  place, and time. Exhibits normal muscle tone. Gait normal. Coordination normal.  Skin: Skin is warm and dry. No rash noted. Not diaphoretic. No erythema. No pallor.  Psychiatric: Mood, memory and judgment normal.  Vitals reviewed.  LABORATORY DATA: Lab Results  Component Value Date   WBC 6.8 03/16/2023   HGB 8.9 (L) 03/16/2023   HCT 26.6 (L) 03/16/2023   MCV 91.1 03/16/2023   PLT 188 03/16/2023      Chemistry      Component Value Date/Time   NA 135 03/16/2023 0924   K 4.4 03/16/2023 0924   CL 104 03/16/2023 0924   CO2 19 (L) 03/16/2023 0924   BUN 80 (H) 03/16/2023 0924   CREATININE 7.69 (HH) 03/16/2023 0924      Component Value Date/Time   CALCIUM 8.5 (L) 03/16/2023 0924   ALKPHOS 94 03/16/2023 0924   AST 15 03/16/2023 0924   ALT 9 03/16/2023 0924   BILITOT 0.3 03/16/2023 0924       RADIOGRAPHIC STUDIES:  VAS [place] UPPER EXT VEIN MAPPING (PRE-OP AVF) Result Date: 02/25/2023 UPPER EXTREMITY VEIN MAPPING Patient Name:  RIVAAN MCNELIS Visscher  Date of Exam:   02/25/2023 Medical Rec #: [id number]              Accession #:  [id number] Date of Birth: [birth year]               Patient Gender: M Patient Age:   21 years Exam Location:  [facility] Procedure:      VAS [place] UPPER EXT VEIN MAPPING (PRE-OP AVF) Referring Phys: Lemar Livings --------------------------------------------------------------------------------  Indications: Pre-access. Performing Technologist: Thereasa Parkin RVT  Examination Guidelines: A complete evaluation includes B-mode imaging, spectral Doppler, color Doppler, and power Doppler as needed of all accessible portions of each vessel. Bilateral testing is considered an integral part of a complete examination. Limited examinations for reoccurring indications may be performed as noted. +-----------------+-------------+----------+--------+ Right Cephalic   Diameter (cm)Depth (cm)Findings +-----------------+-------------+----------+--------+ Shoulder              0.63                        +-----------------+-------------+----------+--------+ Prox upper arm       0.71                        +-----------------+-------------+----------+--------+ Mid upper arm        0.62                        +-----------------+-------------+----------+--------+ Dist upper arm       0.64                        +-----------------+-------------+----------+--------+ Antecubital fossa    0.83                        +-----------------+-------------+----------+--------+ Prox forearm         0.37                        +-----------------+-------------+----------+--------+ Mid forearm          0.32                        +-----------------+-------------+----------+--------+ Dist forearm         0.31                        +-----------------+-------------+----------+--------+ +-----------------+-------------+----------+--------+ Right Basilic    Diameter (cm)Depth (cm)Findings +-----------------+-------------+----------+--------+ Mid upper arm        0.49                        +-----------------+-------------+----------+--------+ Dist upper arm       0.59                        +-----------------+-------------+----------+--------+ Antecubital fossa    0.33                        +-----------------+-------------+----------+--------+ +-----------------+-------------+----------+--------+ Left Cephalic    Diameter (cm)Depth (cm)Findings +-----------------+-------------+----------+--------+ Shoulder             0.40                        +-----------------+-------------+----------+--------+ Prox upper arm       0.35                        +-----------------+-------------+----------+--------+ Mid upper arm        0.28                        +-----------------+-------------+----------+--------+  Dist upper arm       0.34                        +-----------------+-------------+----------+--------+ Antecubital  fossa    0.43                        +-----------------+-------------+----------+--------+ Prox forearm         0.26                        +-----------------+-------------+----------+--------+ Mid forearm          0.22                        +-----------------+-------------+----------+--------+ Dist forearm         0.20                        +-----------------+-------------+----------+--------+ +-----------------+-------------+----------+--------+ Left Basilic     Diameter (cm)Depth (cm)Findings +-----------------+-------------+----------+--------+ Mid upper arm        0.43                        +-----------------+-------------+----------+--------+ Dist upper arm       0.35                        +-----------------+-------------+----------+--------+ Antecubital fossa    0.31                        +-----------------+-------------+----------+--------+ Summary: Right: Patent cephalic and baslic veins. Left: Patent cephalic and baslic veins. *See table(s) above for measurements and observations.  Diagnosing physician: Lemar Livings MD Electronically signed by Lemar Livings MD on 02/25/2023 at 5:29:35 PM.    Final    VAS [place] UPPER EXTREMITY ARTERIAL DUPLEX Result Date: 02/25/2023  UPPER EXTREMITY DUPLEX STUDY Patient Name:  SULEIMAN VACANTI Steffensmeier  Date of Exam:   02/25/2023 Medical Rec #: [id number]              Accession #:    [id number] Date of Birth: Nov 04, 1965               Patient Gender: M Patient Age:   30 years Exam Location:  [facility] Procedure:      VAS [place] UPPER EXTREMITY ARTERIAL DUPLEX Referring Phys: Lemar Livings --------------------------------------------------------------------------------  Indications: Pre op HD access.  Performing Technologist: Thereasa Parkin RVT  Examination Guidelines: A complete evaluation includes B-mode imaging, spectral Doppler, color Doppler, and power Doppler as needed of all accessible portions of each vessel.  Bilateral testing is considered an integral part of a complete examination. Limited examinations for reoccurring indications may be performed as noted.  Right Pre-Dialysis Findings: +-----------------------+----------+--------------------+---------+--------+ Location               PSV (cm/s)Intralum. Diam. (cm)Waveform Comments +-----------------------+----------+--------------------+---------+--------+ Brachial Antecub. fossa57        0.57                triphasic         +-----------------------+----------+--------------------+---------+--------+ Radial Art at Wrist    75        0.21                triphasic         +-----------------------+----------+--------------------+---------+--------+ Ulnar Art at Wrist     59  0.30                triphasic         +-----------------------+----------+--------------------+---------+--------+  Left Pre-Dialysis Findings: +-----------------------+----------+--------------------+---------+--------+ Location               PSV (cm/s)Intralum. Diam. (cm)Waveform Comments +-----------------------+----------+--------------------+---------+--------+ Brachial Antecub. fossa55        0.48                triphasic         +-----------------------+----------+--------------------+---------+--------+ Radial Art at Wrist    46        0.20                triphasic         +-----------------------+----------+--------------------+---------+--------+ Ulnar Art at Wrist     54        0.26                triphasic         +-----------------------+----------+--------------------+---------+--------+  Summary:  Right: Patent brachial, radial, and ulnar arteries. Left: Patent brachial, radial, and ulnar arteries. *See table(s) above for measurements and observations. Electronically signed by Lemar Livings MD on 02/25/2023 at 5:29:20 PM.    Final      ASSESSMENT/PLAN:  This is a very pleasant 57 year old Caucasian male diagnosed with multiple  myeloma with 53% plasma cells on bone marrow biopsy and aspirate.   The patient is here to establish care on an outpatient basis.  He was recently hospitalized in August 2020 for which showed his diagnosis of multiple myeloma.  While admitted to the hospital he underwent day 1 cycle 1.   He rstarted treatment with subcutaneous Velcade 1.3 Mg/KG on days 1, 4, 8 and 11 every 3 weeks, cyclophosphamide 500 Mg/M2 IV on days 1 and 8 as well as Decadron 40 mg p.o. on weekly basis start with the first dose of Velcade on 11/14/22.  In November 2024, daratumumab was added.    The patient is scheduled for a fistula on 03/26/2023.   He will proceed with treatment today as scheduled.  We will cancel his treatment the week of Christmas.  We will see him back a week after Christmas to discuss resuming treatment and make sure he can proceed with treatment after his fistula procedure to make sure this has healed to get chemotherapy.   His hemoglobin is 8.9 today.  His creatinine is 7.69.  He is okay to proceed with day 15 cycle 2 today scheduled.  He is expected to start dialysis at some point in the future.  If his dialysis conflicts with any of his appointment, we can change his treatment days. He is starting dialysis tomorrow and is going to be getting this on Tuesdays, Thursdays, and Saturdays.   The patient was advised to call immediately if he has any concerning symptoms in the interval. The patient voices understanding of current disease status and treatment options and is in agreement with the current care plan. All questions were answered. The patient knows to call the clinic with any problems, questions or concerns. We can certainly see the patient much sooner if necessary        No orders of the defined types were placed in this encounter.    The total time spent in the appointment was 20-29 minutes  Dyke Weible L Mychaela Lennartz, PA-C 03/16/23

## 2023-03-16 ENCOUNTER — Encounter: Payer: Self-pay | Admitting: Internal Medicine

## 2023-03-16 ENCOUNTER — Encounter: Payer: Self-pay | Admitting: Medical Oncology

## 2023-03-16 ENCOUNTER — Inpatient Hospital Stay (HOSPITAL_BASED_OUTPATIENT_CLINIC_OR_DEPARTMENT_OTHER): Payer: BC Managed Care – PPO | Admitting: Physician Assistant

## 2023-03-16 ENCOUNTER — Inpatient Hospital Stay: Payer: BC Managed Care – PPO

## 2023-03-16 VITALS — BP 148/90 | HR 89 | Temp 98.2°F | Resp 17 | Wt 188.0 lb

## 2023-03-16 DIAGNOSIS — C9000 Multiple myeloma not having achieved remission: Secondary | ICD-10-CM

## 2023-03-16 DIAGNOSIS — Z5112 Encounter for antineoplastic immunotherapy: Secondary | ICD-10-CM | POA: Diagnosis not present

## 2023-03-16 LAB — CBC WITH DIFFERENTIAL (CANCER CENTER ONLY)
Abs Immature Granulocytes: 0.03 10*3/uL (ref 0.00–0.07)
Basophils Absolute: 0.1 10*3/uL (ref 0.0–0.1)
Basophils Relative: 1 %
Eosinophils Absolute: 0.2 10*3/uL (ref 0.0–0.5)
Eosinophils Relative: 3 %
HCT: 26.6 % — ABNORMAL LOW (ref 39.0–52.0)
Hemoglobin: 8.9 g/dL — ABNORMAL LOW (ref 13.0–17.0)
Immature Granulocytes: 0 %
Lymphocytes Relative: 5 %
Lymphs Abs: 0.4 10*3/uL — ABNORMAL LOW (ref 0.7–4.0)
MCH: 30.5 pg (ref 26.0–34.0)
MCHC: 33.5 g/dL (ref 30.0–36.0)
MCV: 91.1 fL (ref 80.0–100.0)
Monocytes Absolute: 0.5 10*3/uL (ref 0.1–1.0)
Monocytes Relative: 7 %
Neutro Abs: 5.6 10*3/uL (ref 1.7–7.7)
Neutrophils Relative %: 84 %
Platelet Count: 188 10*3/uL (ref 150–400)
RBC: 2.92 MIL/uL — ABNORMAL LOW (ref 4.22–5.81)
RDW: 15.4 % (ref 11.5–15.5)
WBC Count: 6.8 10*3/uL (ref 4.0–10.5)
nRBC: 0 % (ref 0.0–0.2)

## 2023-03-16 LAB — CMP (CANCER CENTER ONLY)
ALT: 9 U/L (ref 0–44)
AST: 15 U/L (ref 15–41)
Albumin: 3.9 g/dL (ref 3.5–5.0)
Alkaline Phosphatase: 94 U/L (ref 38–126)
Anion gap: 12 (ref 5–15)
BUN: 80 mg/dL — ABNORMAL HIGH (ref 6–20)
CO2: 19 mmol/L — ABNORMAL LOW (ref 22–32)
Calcium: 8.5 mg/dL — ABNORMAL LOW (ref 8.9–10.3)
Chloride: 104 mmol/L (ref 98–111)
Creatinine: 7.69 mg/dL (ref 0.61–1.24)
GFR, Estimated: 8 mL/min — ABNORMAL LOW (ref >60)
Glucose, Bld: 122 mg/dL — ABNORMAL HIGH (ref 70–99)
Potassium: 4.4 mmol/L (ref 3.5–5.1)
Sodium: 135 mmol/L (ref 135–145)
Total Bilirubin: 0.3 mg/dL (ref <1.2)
Total Protein: 6.4 g/dL — ABNORMAL LOW (ref 6.5–8.1)

## 2023-03-16 LAB — SAMPLE TO BLOOD BANK

## 2023-03-16 MED ORDER — SODIUM CHLORIDE 0.9 % IV SOLN: INTRAVENOUS | Status: DC

## 2023-03-16 MED ORDER — DARATUMUMAB-HYALURONIDASE-FIHJ 1800-30000 MG-UT/15ML ~~LOC~~ SOLN
1800.0000 mg | Freq: Once | SUBCUTANEOUS | Status: AC
Start: 2023-03-16 — End: 2023-03-16
  Administered 2023-03-16: 1800 mg via SUBCUTANEOUS
  Filled 2023-03-16: qty 15

## 2023-03-16 MED ORDER — SODIUM CHLORIDE 0.9 % IV SOLN
500.0000 mg/m2 | Freq: Once | INTRAVENOUS | Status: AC
  Administered 2023-03-16: 1000 mg via INTRAVENOUS
  Filled 2023-03-16: qty 50

## 2023-03-16 MED ORDER — ACETAMINOPHEN 325 MG PO TABS
650.0000 mg | ORAL_TABLET | Freq: Once | ORAL | Status: AC
  Administered 2023-03-16: 650 mg via ORAL
  Filled 2023-03-16: qty 2

## 2023-03-16 MED ORDER — BORTEZOMIB CHEMO SQ INJECTION 3.5 MG (2.5MG/ML)
1.5000 mg/m2 | Freq: Once | INTRAMUSCULAR | Status: AC
Start: 2023-03-16 — End: 2023-03-16
  Administered 2023-03-16: 3 mg via SUBCUTANEOUS
  Filled 2023-03-16: qty 1.2

## 2023-03-16 MED ORDER — PALONOSETRON HCL INJECTION 0.25 MG/5ML
0.2500 mg | Freq: Once | INTRAVENOUS | Status: AC
Start: 2023-03-16 — End: 2023-03-16
  Administered 2023-03-16: 0.25 mg via INTRAVENOUS
  Filled 2023-03-16: qty 5

## 2023-03-16 MED ORDER — DEXAMETHASONE 4 MG PO TABS
40.0000 mg | ORAL_TABLET | Freq: Once | ORAL | Status: AC
Start: 2023-03-16 — End: 2023-03-16
  Administered 2023-03-16: 40 mg via ORAL
  Filled 2023-03-16: qty 10

## 2023-03-16 MED ORDER — DIPHENHYDRAMINE HCL 25 MG PO CAPS
50.0000 mg | ORAL_CAPSULE | Freq: Once | ORAL | Status: AC
  Administered 2023-03-16: 50 mg via ORAL
  Filled 2023-03-16: qty 2

## 2023-03-16 NOTE — Patient Instructions (Addendum)
[facility] - A DEPT OF [facility]  Discharge Instructions: Thank you for choosing [facility] to provide your oncology and hematology care.   If you have a lab appointment with the Cancer Center, please go directly to the Cancer Center and check in at the registration area.   Wear comfortable clothing and clothing appropriate for easy access to any Portacath or PICC line.   We strive to give you quality time with your provider. You may need to reschedule your appointment if you arrive late (15 or more minutes).  Arriving late affects you and other patients whose appointments are after yours.  Also, if you miss three or more appointments without notifying the office, you may be dismissed from the clinic at the provider's discretion.      For prescription refill requests, have your pharmacy contact our office and allow 72 hours for refills to be completed.    Today you received the following chemotherapy and/or immunotherapy agents: Velcade, Cytoxan, Darzalex Faspro      To help prevent nausea and vomiting after your treatment, we encourage you to take your nausea medication as directed.  BELOW ARE SYMPTOMS THAT SHOULD BE REPORTED IMMEDIATELY: *FEVER GREATER THAN 100.4 F (38 C) OR HIGHER *CHILLS OR SWEATING *NAUSEA AND VOMITING THAT IS NOT CONTROLLED WITH YOUR NAUSEA MEDICATION *UNUSUAL SHORTNESS OF BREATH *UNUSUAL BRUISING OR BLEEDING *URINARY PROBLEMS (pain or burning when urinating, or frequent urination) *BOWEL PROBLEMS (unusual diarrhea, constipation, pain near the anus) TENDERNESS IN MOUTH AND THROAT WITH OR WITHOUT PRESENCE OF ULCERS (sore throat, sores in mouth, or a toothache) UNUSUAL RASH, SWELLING OR PAIN  UNUSUAL VAGINAL DISCHARGE OR ITCHING   Items with * indicate a potential emergency and should be followed up as soon as possible or go to the Emergency Department if any problems should occur.  Please show the CHEMOTHERAPY  ALERT CARD or IMMUNOTHERAPY ALERT CARD at check-in to the Emergency Department and triage nurse.  Should you have questions after your visit or need to cancel or reschedule your appointment, please contact [facility] - A DEPT OF [facility]  Dept: [phone]  and follow the prompts.  Office hours are 8:00 a.m. to 4:30 p.m. Monday - Friday. Please note that voicemails left after 4:00 p.m. may not be returned until the following business day.  We are closed weekends and major holidays. You have access to a nurse at all times for urgent questions. Please call the main number to the clinic Dept: [phone] and follow the prompts.   For any non-urgent questions, you may also contact your provider using MyChart. We now offer e-Visits for anyone 60 and older to request care online for non-urgent symptoms. For details visit [email or web address].   Also download the MyChart app! Go to the app store, search "MyChart", open the app, select [facility], and log in with your MyChart username and password.

## 2023-03-16 NOTE — Progress Notes (Signed)
OK to treat today with creatinine 7.69 per [name], PA-C.

## 2023-03-16 NOTE — Progress Notes (Signed)
Critical result called by [name]  for creatinine= 7.69 Drew Cooper received it and [name] notified CKD- Creatinine today is improved.  Marland Kitchen

## 2023-03-18 ENCOUNTER — Encounter: Payer: Self-pay | Admitting: Internal Medicine

## 2023-03-19 ENCOUNTER — Encounter: Payer: Self-pay | Admitting: Internal Medicine

## 2023-03-21 ENCOUNTER — Other Ambulatory Visit: Payer: Self-pay | Admitting: Internal Medicine

## 2023-03-23 ENCOUNTER — Ambulatory Visit: Payer: BC Managed Care – PPO

## 2023-03-23 ENCOUNTER — Other Ambulatory Visit: Payer: BC Managed Care – PPO

## 2023-03-23 ENCOUNTER — Encounter (HOSPITAL_COMMUNITY): Payer: Self-pay | Admitting: Surgery

## 2023-03-23 NOTE — Progress Notes (Signed)
SDW call  Patient was given pre-op instructions over the phone. Patient verbalized understanding of instructions provided.     PCP - Dr. [name] Cardiologist -  Pulmonary:    PPM/ICD - denies Device Orders - na Rep Notified - na   Chest x-ray - 11/08/2022 EKG -  11/08/2022 Stress Test - ECHO -  Cardiac Cath -   Sleep Study/sleep apnea/CPAP: denies  Non-diabetic   Blood Thinner Instructions: denies Aspirin Instructions:denies   ERAS Protcol - NPO   COVID TEST- na    Anesthesia review: Yes. ESRD with dialysis, cancer   Patient denies shortness of breath, fever, cough and chest pain over the phone call  Your procedure is scheduled on Thursday March 26, 2023  Report to [facility] Main Entrance "A" at  0530  A.M., then check in with the Admitting office.  Call this number if you have problems the morning of surgery:  [phone]   If you have any questions prior to your surgery date call [phone]: Open Monday-Friday 8am-4pm If you experience any cold or flu symptoms such as cough, fever, chills, shortness of breath, etc. between now and your scheduled surgery, please notify us at the above number    Remember:  Do not eat or drink after midnight the night before your surgery  Take these medicines the morning of surgery with A SIP OF WATER:  Gabapentin  As needed: Tylenol, compazine  As of today, STOP taking any Aspirin (unless otherwise instructed by your surgeon) Aleve, Naproxen, Ibuprofen, Motrin, Advil, Goody's, BC's, all herbal medications, fish oil, and all vitamins.

## 2023-03-23 NOTE — Progress Notes (Signed)
Anesthesia Chart Review: Drew Cooper  Case: [id number] Date/Time: 03/26/23 0715   Procedure: LEFT ARM FISTULA CREATION (Left)   Anesthesia type: Choice   Pre-op diagnosis: ESRD   [facility]: [facility] ROOM 11 / [facility]   Surgeons: Nada Libman, MD       DISCUSSION: Patient is a 57 year old male scheduled for the above procedure. Right internal jugular tunneled catheter placed 11/13/22 for plasmapheresis during admission for profound anemia and acute on chronic renal failure and radiographic evidence of lytic bone lesions. Bone marrow aspirate consistent with plasma cell neoplasm. He received ~ 4 treatments of plasmapheresis, 3 units PRBC and was started on targeted therapy for multiple myeloma. He continues to follow-up with oncology. Last therapy on 03/16/23. He has also required additional PRBC, last 03/09/23.  Oncology notes indicated that he was scheduled to start hemodialysis TTS on 03/17/23.   History includes former smoker, multiple myeloma (diagnosed 10/2022 after presenting with anemia and acute on chronic renal failure, s/p 3 units PRBC, plasmapheresis, targeted therapy with  Velcade/Decadron; current therapy Daratumumab SQ + Cyclophosphamide PO + Bortezomib SQ + Dexamethasone PO Q 28 days x 8 cycles), ESRD ([facility], HD initiated 11/15/22).  Last oncology follow-up with treatment on 03/16/23 with Heilingoetter, Cassandra, PA-C. She noted plans for AVF 03/26/23, so planning to cancel week of Christmas treatment with plan for follow-up the week after to make sure wounds healing and determine if okay to resume chemotherapy.   Anesthesia team to evaluate on the day of surgery.    VS:  Wt Readings from Last 3 Encounters:  03/16/23 85.3 kg  03/09/23 84.4 kg  03/02/23 86 kg   BP Readings from Last 3 Encounters:  03/16/23 (!) 148/90  03/09/23 (!) 151/96  03/04/23 (!) 147/98   Pulse Readings from Last 3 Encounters:  03/16/23 89  03/09/23 89  03/04/23 78      PROVIDERS: [name], MD is PCP  Zetta Bills, MD is nephrologist Si Gaul, MD is HEM-ONC [facility]). He also had evaluation by Marzella Schlein, MD with [facility] on 01/13/23.    LABS: For day of surgery. Last results in [facility] include: Lab Results  Component Value Date   WBC 6.8 03/16/2023   HGB 8.9 (L) 03/16/2023   HCT 26.6 (L) 03/16/2023   PLT 188 03/16/2023   GLUCOSE 122 (H) 03/16/2023   ALT 9 03/16/2023   AST 15 03/16/2023   NA 135 03/16/2023   K 4.4 03/16/2023   CL 104 03/16/2023   CREATININE 7.69 (HH) 03/16/2023   BUN 80 (H) 03/16/2023   CO2 19 (L) 03/16/2023   HGBA1C 5.9 (H) 11/09/2022     IMAGES: PET Scan 12/04/22: IMPRESSION: 1. No hypermetabolic osseous or soft tissue lesions are identified to suggest active multiple myeloma. 2. The bones are diffusely demineralized with scattered small lucent lesions which could relate to the patient's multiple myeloma. No pathologic fractures are seen. 3. No suspicious soft tissue findings. 4. Nonobstructing right renal calculi. 5.  Aortic Atherosclerosis (ICD10-I70.0).    EKG: 11/08/22: NSR   CV: Tilt table test 04/19/03: 1. History of syncope symptoms consistent with vasovagal syncope. 2. Negative tilt table test for syncope. PLAN:  Will treat prophylactically for vasovagal syncope.     Past Medical History:  Diagnosis Date   Cancer (HCC) 10/2022   multiple myeloma   Chronic kidney disease     Past Surgical History:  Procedure Laterality Date   IR FLUORO GUIDE CV LINE RIGHT  11/13/2022  IR US GUIDE VASC ACCESS RIGHT  11/13/2022   KNEE SURGERY      MEDICATIONS: No current facility-administered medications for this encounter.    acetaminophen (TYLENOL) 500 MG tablet   Calcium Carbonate (CALCIUM 600 PO)   dexamethasone (DECADRON) 4 MG tablet   ferrous sulfate 325 (65 FE) MG tablet   furosemide (LASIX) 40 MG tablet   prochlorperazine (COMPAZINE) 10 MG tablet   sodium bicarbonate 650 MG tablet    acyclovir (ZOVIRAX) 200 MG capsule   DARZALEX FASPRO 1800-30000 MG-UT/15ML SOLN   ferric citrate (AURYXIA) 1 GM 210 MG(Fe) tablet   gabapentin (NEURONTIN) 100 MG capsule   lidocaine (LIDODERM) 5 %    Shonna Chock, PA-C Surgical Short Stay/Anesthesiology [facility] Phone [phone] [facility] Phone [phone] 03/23/2023 10:00 AM

## 2023-03-23 NOTE — Anesthesia Preprocedure Evaluation (Signed)
Anesthesia Evaluation  Patient identified by MRN, date of birth, ID band Patient awake    Reviewed: Allergy & Precautions, H&P , NPO status , Patient's Chart, lab work & pertinent test results  Airway Mallampati: II  TM Distance: >3 FB Neck ROM: Full    Dental no notable dental hx.    Pulmonary former smoker   Pulmonary exam normal breath sounds clear to auscultation       Cardiovascular negative cardio ROS Normal cardiovascular exam Rhythm:Regular Rate:Normal     Neuro/Psych negative neurological ROS  negative psych ROS   GI/Hepatic negative GI ROS, Neg liver ROS,,,  Endo/Other  negative endocrine ROS    Renal/GU ESRFRenal disease  negative genitourinary   Musculoskeletal  (+) Arthritis ,    Abdominal   Peds negative pediatric ROS (+)  Hematology  (+) Blood dyscrasia, anemia Multiple myeloma   Anesthesia Other Findings   Reproductive/Obstetrics negative OB ROS                             Anesthesia Physical Anesthesia Plan  ASA: 4  Anesthesia Plan: Regional and MAC   Post-op Pain Management: Regional block*   Induction: Intravenous  PONV Risk Score and Plan: 1 and Ondansetron, Dexamethasone and Propofol infusion  Airway Management Planned: Natural Airway  Additional Equipment:   Intra-op Plan:   Post-operative Plan:   Informed Consent: I have reviewed the patients History and Physical, chart, labs and discussed the procedure including the risks, benefits and alternatives for the proposed anesthesia with the patient or authorized representative who has indicated his/her understanding and acceptance.     Dental advisory given  Plan Discussed with: CRNA  Anesthesia Plan Comments: (PAT note written 03/23/2023 by Shonna Chock, PA-C.  )       Anesthesia Quick Evaluation

## 2023-03-26 ENCOUNTER — Ambulatory Visit (HOSPITAL_COMMUNITY)
Admission: RE | Admit: 2023-03-26 | Discharge: 2023-03-26 | Disposition: A | Discharge status: Home / Self Care | Payer: Self-pay | Attending: Surgery | Admitting: Surgery

## 2023-03-26 ENCOUNTER — Other Ambulatory Visit: Payer: Self-pay

## 2023-03-26 ENCOUNTER — Ambulatory Visit (HOSPITAL_BASED_OUTPATIENT_CLINIC_OR_DEPARTMENT_OTHER): Payer: Self-pay | Admitting: Vascular Surgery

## 2023-03-26 ENCOUNTER — Ambulatory Visit (HOSPITAL_COMMUNITY): Payer: Self-pay | Admitting: Vascular Surgery

## 2023-03-26 ENCOUNTER — Encounter (HOSPITAL_COMMUNITY)
Admission: RE | Disposition: A | Discharge status: Home / Self Care | Payer: Self-pay | Source: Home / Self Care | Attending: Surgery

## 2023-03-26 ENCOUNTER — Encounter: Payer: Self-pay | Admitting: Internal Medicine

## 2023-03-26 ENCOUNTER — Other Ambulatory Visit (HOSPITAL_COMMUNITY): Payer: Self-pay

## 2023-03-26 ENCOUNTER — Encounter (HOSPITAL_COMMUNITY): Payer: Self-pay | Admitting: Surgery

## 2023-03-26 DIAGNOSIS — Z5986 Financial insecurity: Secondary | ICD-10-CM | POA: Diagnosis not present

## 2023-03-26 DIAGNOSIS — C9000 Multiple myeloma not having achieved remission: Secondary | ICD-10-CM | POA: Diagnosis not present

## 2023-03-26 DIAGNOSIS — Z87891 Personal history of nicotine dependence: Secondary | ICD-10-CM | POA: Diagnosis not present

## 2023-03-26 DIAGNOSIS — N185 Chronic kidney disease, stage 5: Secondary | ICD-10-CM

## 2023-03-26 DIAGNOSIS — N186 End stage renal disease: Secondary | ICD-10-CM | POA: Diagnosis present

## 2023-03-26 HISTORY — PX: AV FISTULA PLACEMENT: SHX1204

## 2023-03-26 LAB — POCT I-STAT, CHEM 8
BUN: 76 mg/dL — ABNORMAL HIGH (ref 6–20)
Calcium, Ion: 1.11 mmol/L — ABNORMAL LOW (ref 1.15–1.40)
Chloride: 107 mmol/L (ref 98–111)
Creatinine, Ser: 8.1 mg/dL — ABNORMAL HIGH (ref 0.61–1.24)
Glucose, Bld: 83 mg/dL (ref 70–99)
HCT: 20 % — ABNORMAL LOW (ref 39.0–52.0)
Hemoglobin: 6.8 g/dL — CL (ref 13.0–17.0)
Potassium: 4.5 mmol/L (ref 3.5–5.1)
Sodium: 136 mmol/L (ref 135–145)
TCO2: 19 mmol/L — ABNORMAL LOW (ref 22–32)

## 2023-03-26 LAB — TYPE AND SCREEN
ABO/RH(D): AB POS
Antibody Screen: POSITIVE

## 2023-03-26 LAB — PREPARE RBC (CROSSMATCH)

## 2023-03-26 SURGERY — ARTERIOVENOUS (AV) FISTULA CREATION
Anesthesia: Monitor Anesthesia Care | Site: Arm Lower | Laterality: Left

## 2023-03-26 MED ORDER — LIDOCAINE-EPINEPHRINE (PF) 1.5 %-1:200000 IJ SOLN
INTRAMUSCULAR | Status: DC | PRN
  Administered 2023-03-26: 25 mL via PERINEURAL

## 2023-03-26 MED ORDER — CHLORHEXIDINE GLUCONATE 0.12 % MT SOLN
15.0000 mL | Freq: Once | OROMUCOSAL | Status: AC
  Administered 2023-03-26: 15 mL via OROMUCOSAL
  Filled 2023-03-26: qty 15

## 2023-03-26 MED ORDER — CEFAZOLIN SODIUM-DEXTROSE 2-4 GM/100ML-% IV SOLN
2.0000 g | INTRAVENOUS | Status: AC
  Administered 2023-03-26: 2 g via INTRAVENOUS
  Filled 2023-03-26: qty 100

## 2023-03-26 MED ORDER — HEPARIN 6000 UNIT IRRIGATION SOLUTION
Status: AC
  Filled 2023-03-26: qty 500

## 2023-03-26 MED ORDER — HEPARIN 6000 UNIT IRRIGATION SOLUTION
Status: DC | PRN
  Administered 2023-03-26: 1

## 2023-03-26 MED ORDER — MIDAZOLAM HCL 2 MG/2ML IJ SOLN
INTRAMUSCULAR | Status: DC | PRN
  Administered 2023-03-26 (×2): 1 mg via INTRAVENOUS

## 2023-03-26 MED ORDER — VASOPRESSIN 20 UNIT/ML IV SOLN
INTRAVENOUS | Status: AC
  Filled 2023-03-26: qty 1

## 2023-03-26 MED ORDER — PROPOFOL 10 MG/ML IV BOLUS
INTRAVENOUS | Status: AC
  Filled 2023-03-26: qty 20

## 2023-03-26 MED ORDER — LIDOCAINE-EPINEPHRINE (PF) 1 %-1:200000 IJ SOLN
INTRAMUSCULAR | Status: AC
  Filled 2023-03-26: qty 30

## 2023-03-26 MED ORDER — SODIUM CHLORIDE 0.9 % IV SOLN: 10.0000 mL/h | Freq: Once | INTRAVENOUS | Status: DC

## 2023-03-26 MED ORDER — PROPOFOL 500 MG/50ML IV EMUL
INTRAVENOUS | Status: DC | PRN
  Administered 2023-03-26: 60 ug/kg/min via INTRAVENOUS

## 2023-03-26 MED ORDER — CHLORHEXIDINE GLUCONATE 4 % EX SOLN: 60.0000 mL | Freq: Once | CUTANEOUS | Status: DC

## 2023-03-26 MED ORDER — OXYCODONE HCL 5 MG/5ML PO SOLN: 5.0000 mg | Freq: Once | ORAL | Status: DC | PRN

## 2023-03-26 MED ORDER — LIDOCAINE 2% (20 MG/ML) 5 ML SYRINGE
INTRAMUSCULAR | Status: DC | PRN
  Administered 2023-03-26: 20 mg via INTRAVENOUS

## 2023-03-26 MED ORDER — OXYCODONE HCL 5 MG PO TABS
5.0000 mg | ORAL_TABLET | Freq: Four times a day (QID) | ORAL | 0 refills | Status: AC | PRN
  Filled 2023-03-26: qty 12, 3d supply, fill #0

## 2023-03-26 MED ORDER — FENTANYL CITRATE (PF) 100 MCG/2ML IJ SOLN: 25.0000 ug | INTRAMUSCULAR | Status: DC | PRN

## 2023-03-26 MED ORDER — ORAL CARE MOUTH RINSE: 15.0000 mL | Freq: Once | OROMUCOSAL | Status: AC

## 2023-03-26 MED ORDER — FENTANYL CITRATE (PF) 250 MCG/5ML IJ SOLN
INTRAMUSCULAR | Status: AC
  Filled 2023-03-26: qty 5

## 2023-03-26 MED ORDER — DROPERIDOL 2.5 MG/ML IJ SOLN: 0.6250 mg | Freq: Once | INTRAMUSCULAR | Status: DC | PRN

## 2023-03-26 MED ORDER — 0.9 % SODIUM CHLORIDE (POUR BTL) OPTIME
TOPICAL | Status: DC | PRN
  Administered 2023-03-26: 1000 mL

## 2023-03-26 MED ORDER — OXYCODONE HCL 5 MG PO TABS: 5.0000 mg | ORAL_TABLET | Freq: Once | ORAL | Status: DC | PRN

## 2023-03-26 MED ORDER — MIDAZOLAM HCL 2 MG/2ML IJ SOLN
INTRAMUSCULAR | Status: AC
  Filled 2023-03-26: qty 2

## 2023-03-26 MED ORDER — FENTANYL CITRATE (PF) 250 MCG/5ML IJ SOLN
INTRAMUSCULAR | Status: DC | PRN
  Administered 2023-03-26: 50 ug via INTRAVENOUS

## 2023-03-26 MED ORDER — ACETAMINOPHEN 10 MG/ML IV SOLN: 1000.0000 mg | Freq: Once | INTRAVENOUS | Status: DC | PRN

## 2023-03-26 MED ORDER — CEFAZOLIN SODIUM-DEXTROSE 2-4 GM/100ML-% IV SOLN
INTRAVENOUS | Status: AC
  Filled 2023-03-26: qty 100

## 2023-03-26 MED ORDER — SODIUM CHLORIDE 0.9 % IV SOLN: INTRAVENOUS | Status: DC

## 2023-03-26 MED ORDER — SODIUM CHLORIDE 0.9 % IV SOLN: INTRAVENOUS | Status: DC | PRN

## 2023-03-26 SURGICAL SUPPLY — 32 items
ARMBAND PINK RESTRICT EXTREMIT (MISCELLANEOUS) ×2 IMPLANT
BAG COUNTER SPONGE SURGICOUNT (BAG) ×1 IMPLANT
CANISTER SUCT 3000ML PPV (MISCELLANEOUS) ×1 IMPLANT
CLIP TI MEDIUM 6 (CLIP) ×1 IMPLANT
CLIP TI WIDE RED SMALL 6 (CLIP) ×1 IMPLANT
COVER PROBE W GEL 5X96 (DRAPES) ×1 IMPLANT
DERMABOND ADVANCED .7 DNX12 (GAUZE/BANDAGES/DRESSINGS) ×1 IMPLANT
DERMABOND ADVANCED .7 DNX6 (GAUZE/BANDAGES/DRESSINGS) IMPLANT
ELECT REM PT RETURN 9FT ADLT (ELECTROSURGICAL) ×1 IMPLANT
ELECTRODE REM PT RTRN 9FT ADLT (ELECTROSURGICAL) ×1 IMPLANT
GLOVE BIOGEL PI IND STRL 6.5 (GLOVE) IMPLANT
GLOVE BIOGEL PI IND STRL 7.0 (GLOVE) IMPLANT
GLOVE BIOGEL PI MICRO STRL 7.5 (GLOVE) IMPLANT
GLOVE SURG SS PI 7.5 STRL IVOR (GLOVE) ×3 IMPLANT
GOWN STRL REUS W/ TWL LRG LVL3 (GOWN DISPOSABLE) ×2 IMPLANT
GOWN STRL REUS W/ TWL XL LVL3 (GOWN DISPOSABLE) ×1 IMPLANT
HEMOSTAT SNOW SURGICEL 2X4 (HEMOSTASIS) IMPLANT
KIT BASIN OR (CUSTOM PROCEDURE TRAY) ×1 IMPLANT
KIT TURNOVER KIT B (KITS) ×1 IMPLANT
NS IRRIG 1000ML POUR BTL (IV SOLUTION) ×1 IMPLANT
PACK CV ACCESS (CUSTOM PROCEDURE TRAY) ×1 IMPLANT
PAD ARMBOARD 7.5X6 YLW CONV (MISCELLANEOUS) ×2 IMPLANT
SLING ARM FOAM STRAP LRG (SOFTGOODS) IMPLANT
SLING ARM FOAM STRAP MED (SOFTGOODS) IMPLANT
SUT MNCRL AB 4-0 PS2 18 (SUTURE) IMPLANT
SUT PROLENE 6 0 BV (SUTURE) IMPLANT
SUT PROLENE 6 0 CC (SUTURE) ×1 IMPLANT
SUT VIC AB 3-0 SH 27X BRD (SUTURE) ×1 IMPLANT
SUT VICRYL 4-0 PS2 18IN ABS (SUTURE) ×1 IMPLANT
TOWEL GREEN STERILE (TOWEL DISPOSABLE) ×1 IMPLANT
UNDERPAD 30X36 HEAVY ABSORB (UNDERPADS AND DIAPERS) ×1 IMPLANT
WATER STERILE IRR 1000ML POUR (IV SOLUTION) ×1 IMPLANT

## 2023-03-26 NOTE — Discharge Instructions (Signed)

## 2023-03-26 NOTE — Transfer of Care (Signed)
Immediate Anesthesia Transfer of Care Note  Patient: Drew Cooper  Procedure(s) Performed: LEFT ARM FISTULA CREATION (Left: Arm Lower)  Patient Location: PACU  Anesthesia Type:MAC combined with regional for post-op pain  Level of Consciousness: awake, alert , and oriented  Airway & Oxygen Therapy: Patient Spontanous Breathing  Post-op Assessment: Report given to RN and Post -op Vital signs reviewed and stable  Post vital signs: Reviewed and stable  Last Vitals:  Vitals Value Taken Time  BP 160/91 03/26/23 0908  Temp    Pulse 80 03/26/23 0912  Resp 17 03/26/23 0912  SpO2 100 % 03/26/23 0912  Vitals shown include unfiled device data.  Last Pain:  Vitals:   03/26/23 0607  PainSc: 0-No pain         Complications: No notable events documented.

## 2023-03-26 NOTE — Anesthesia Procedure Notes (Signed)
Procedure Name: MAC Date/Time: 03/26/2023 7:57 AM  Performed by: Marena Chancy, CRNAPre-anesthesia Checklist: Patient identified, Emergency Drugs available, Suction available, Patient being monitored and Timeout performed Patient Re-evaluated:Patient Re-evaluated prior to induction Oxygen Delivery Method: Simple face mask

## 2023-03-26 NOTE — Interval H&P Note (Signed)
History and Physical Interval Note:  03/26/2023 7:38 AM  Drew Cooper  has presented today for surgery, with the diagnosis of ESRD.  The various methods of treatment have been discussed with the patient and family. After consideration of risks, benefits and other options for treatment, the patient has consented to  Procedure(s): LEFT ARM FISTULA CREATION (Left) as a surgical intervention.  The patient's history has been reviewed, patient examined, no change in status, stable for surgery.  I have reviewed the patient's chart and labs.  Questions were answered to the patient's satisfaction.     [name]

## 2023-03-26 NOTE — Op Note (Signed)
    Patient name: Drew Cooper MRN: [id number] DOB: 12/04/1965 Sex: male  03/26/2023 Pre-operative Diagnosis: End-stage renal disease Post-operative diagnosis:  Same Surgeon:  [name] Assistants:  Clinton Gallant, PA Procedure:   Left brachiocephalic fistula Anesthesia:  General Blood Loss:  minimal Specimens:  none  Findings: 4 mm vein, 4 mm artery  Indications: This is a 57 year old gentleman who comes in today for dialysis access.  Procedure:  The patient was identified in the holding area and taken to [facility] OR ROOM 11  The patient was then placed supine on the table. regional anesthesia was administered.  The patient was prepped and draped in the usual sterile fashion.  A time out was called and antibiotics were administered.  Ultrasound was used to evaluate the cephalic vein in the left arm which was of excellent caliber.  A transverse incision was made just proximal to the antecubital crease.  Cautery was used to divide the subcutaneous tissue.  I dissected out the brachial artery which was a 4 mm disease-free artery.  I then identified the median cubital vein.  This was a 4 mm vein.  It was fully mobilized and then marked for orientation.  Side branches were ligated between silk ties.  The vein was then ligated distally with a 2-0 silk tie.  The vein distended nicely with heparin saline.  Next, the brachial artery was occluded with vascular clamps and a 11 blade was used to make an arteriotomy which was extended longitudinally with Potts scissors.  The vein was then cut to the appropriate length and spatulated to fit this out the arteriotomy.  A running anastomosis was created with 6-0 Prolene.  Prior to completion, the appropriate flushing maneuvers were performed and the anastomosis was completed.  I then dissected out the vein to make sure there were no kinks.  I ligated the communication to the distal cephalic vein.  Patient had a excellent radial Doppler signal that did not change  significantly with fistula compression.  The wound was then irrigated.  Hemostasis was achieved.  The deep tissue was reapproximated with 3-0 Vicryl and skin was closed with 4-0 Vicryl followed by Dermabond.  There were no immediate complications.   Disposition: To PACU stable.   Juleen China, M.D., [facility] [place] Office: [phone] Pager:  [phone]

## 2023-03-26 NOTE — Anesthesia Procedure Notes (Signed)
Anesthesia Regional Block: Supraclavicular block   Pre-Anesthetic Checklist: , timeout performed,  Correct Patient, Correct Site, Correct Laterality,  Correct Procedure, Correct Position, site marked,  Risks and benefits discussed,  Surgical consent,  Pre-op evaluation,  At surgeon's request and post-op pain management  Laterality: Left  Prep: chloraprep       Needles:  Injection technique: Single-shot  Needle Type: Echogenic Stimulator Needle     Needle Length: 9cm  Needle Gauge: 21     Additional Needles:   Procedures:,,,, ultrasound used (permanent image in chart),,    Narrative:  Start time: 03/26/2023 7:05 AM End time: 03/26/2023 7:15 AM Injection made incrementally with aspirations every 5 mL.  Performed by: Personally  Anesthesiologist:  Nation, MD  Additional Notes: Discussed risks and benefits of the nerve block in detail, including but not limited vascular injury, permanent nerve damage and infection.   Patient tolerated the procedure well. Local anesthetic introduced in an incremental fashion under minimal resistance after negative aspirations. No paresthesias were elicited. After completion of the procedure, no acute issues were identified and patient continued to be monitored by RN.

## 2023-03-26 NOTE — Anesthesia Postprocedure Evaluation (Signed)
Anesthesia Post Note  Patient: Drew Cooper  Procedure(s) Performed: LEFT ARM FISTULA CREATION (Left: Arm Lower)     Patient location during evaluation: PACU Anesthesia Type: Regional and MAC Level of consciousness: awake and alert Pain management: pain level controlled Vital Signs Assessment: post-procedure vital signs reviewed and stable Respiratory status: spontaneous breathing, nonlabored ventilation, respiratory function stable and patient connected to nasal cannula oxygen Cardiovascular status: stable and blood pressure returned to baseline Postop Assessment: no apparent nausea or vomiting Anesthetic complications: no   No notable events documented.  Last Vitals:  Vitals:   03/26/23 0930 03/26/23 0945  BP: (!) 161/93 (!) 164/97  Pulse: 76 82  Resp: 17 18  Temp:  36.6 C  SpO2: 100% 99%    Last Pain:  Vitals:   03/26/23 0945  PainSc: 0-No pain                 [place]

## 2023-03-27 ENCOUNTER — Encounter (HOSPITAL_COMMUNITY): Payer: Self-pay | Admitting: Surgery

## 2023-03-30 ENCOUNTER — Inpatient Hospital Stay: Payer: BC Managed Care – PPO

## 2023-03-30 ENCOUNTER — Encounter: Payer: Self-pay | Admitting: Medical Oncology

## 2023-03-30 ENCOUNTER — Other Ambulatory Visit: Payer: Self-pay | Admitting: Medical Oncology

## 2023-03-30 ENCOUNTER — Inpatient Hospital Stay (HOSPITAL_BASED_OUTPATIENT_CLINIC_OR_DEPARTMENT_OTHER): Payer: BC Managed Care – PPO | Admitting: Internal Medicine

## 2023-03-30 VITALS — BP 134/82 | HR 89 | Temp 97.9°F | Resp 16 | Ht 70.0 in | Wt 183.9 lb

## 2023-03-30 DIAGNOSIS — Z5112 Encounter for antineoplastic immunotherapy: Secondary | ICD-10-CM | POA: Diagnosis not present

## 2023-03-30 DIAGNOSIS — C9000 Multiple myeloma not having achieved remission: Secondary | ICD-10-CM

## 2023-03-30 DIAGNOSIS — D649 Anemia, unspecified: Secondary | ICD-10-CM

## 2023-03-30 LAB — CBC WITH DIFFERENTIAL (CANCER CENTER ONLY)
Abs Immature Granulocytes: 0.02 10*3/uL (ref 0.00–0.07)
Basophils Absolute: 0.1 10*3/uL (ref 0.0–0.1)
Basophils Relative: 2 %
Eosinophils Absolute: 0.1 10*3/uL (ref 0.0–0.5)
Eosinophils Relative: 2 %
HCT: 23 % — ABNORMAL LOW (ref 39.0–52.0)
Hemoglobin: 7.8 g/dL — ABNORMAL LOW (ref 13.0–17.0)
Immature Granulocytes: 1 %
Lymphocytes Relative: 8 %
Lymphs Abs: 0.3 10*3/uL — ABNORMAL LOW (ref 0.7–4.0)
MCH: 30.8 pg (ref 26.0–34.0)
MCHC: 33.9 g/dL (ref 30.0–36.0)
MCV: 90.9 fL (ref 80.0–100.0)
Monocytes Absolute: 0.3 10*3/uL (ref 0.1–1.0)
Monocytes Relative: 8 %
Neutro Abs: 3.2 10*3/uL (ref 1.7–7.7)
Neutrophils Relative %: 79 %
Platelet Count: 196 10*3/uL (ref 150–400)
RBC: 2.53 MIL/uL — ABNORMAL LOW (ref 4.22–5.81)
RDW: 15.8 % — ABNORMAL HIGH (ref 11.5–15.5)
WBC Count: 4 10*3/uL (ref 4.0–10.5)
nRBC: 0 % (ref 0.0–0.2)

## 2023-03-30 LAB — CMP (CANCER CENTER ONLY)
ALT: <5 U/L (ref 0–44)
AST: 15 U/L (ref 15–41)
Albumin: 3.9 g/dL (ref 3.5–5.0)
Alkaline Phosphatase: 82 U/L (ref 38–126)
Anion gap: 11 (ref 5–15)
BUN: 53 mg/dL — ABNORMAL HIGH (ref 6–20)
CO2: 26 mmol/L (ref 22–32)
Calcium: 8.8 mg/dL — ABNORMAL LOW (ref 8.9–10.3)
Chloride: 97 mmol/L — ABNORMAL LOW (ref 98–111)
Creatinine: 6.59 mg/dL — ABNORMAL HIGH (ref 0.61–1.24)
GFR, Estimated: 9 mL/min — ABNORMAL LOW (ref >60)
Glucose, Bld: 101 mg/dL — ABNORMAL HIGH (ref 70–99)
Potassium: 4.3 mmol/L (ref 3.5–5.1)
Sodium: 134 mmol/L — ABNORMAL LOW (ref 135–145)
Total Bilirubin: 0.4 mg/dL (ref <1.2)
Total Protein: 6.3 g/dL — ABNORMAL LOW (ref 6.5–8.1)

## 2023-03-30 LAB — SAMPLE TO BLOOD BANK

## 2023-03-30 LAB — PREPARE RBC (CROSSMATCH)

## 2023-03-30 MED ORDER — ACETAMINOPHEN 325 MG PO TABS
650.0000 mg | ORAL_TABLET | Freq: Once | ORAL | Status: AC
  Administered 2023-03-30: 650 mg via ORAL
  Filled 2023-03-30: qty 2

## 2023-03-30 MED ORDER — SODIUM CHLORIDE 0.9 % IV SOLN
500.0000 mg/m2 | Freq: Once | INTRAVENOUS | Status: AC
  Administered 2023-03-30: 1000 mg via INTRAVENOUS
  Filled 2023-03-30: qty 50

## 2023-03-30 MED ORDER — SODIUM CHLORIDE 0.9 % IV SOLN: Freq: Once | INTRAVENOUS | Status: AC

## 2023-03-30 MED ORDER — SODIUM CHLORIDE 0.9% IV SOLUTION: 250.0000 mL | INTRAVENOUS | Status: DC

## 2023-03-30 MED ORDER — DEXAMETHASONE 4 MG PO TABS
40.0000 mg | ORAL_TABLET | Freq: Once | ORAL | Status: AC
  Administered 2023-03-30: 40 mg via ORAL
  Filled 2023-03-30: qty 10

## 2023-03-30 MED ORDER — DARATUMUMAB-HYALURONIDASE-FIHJ 1800-30000 MG-UT/15ML ~~LOC~~ SOLN
1800.0000 mg | Freq: Once | SUBCUTANEOUS | Status: AC
  Administered 2023-03-30: 1800 mg via SUBCUTANEOUS
  Filled 2023-03-30: qty 15

## 2023-03-30 MED ORDER — BORTEZOMIB CHEMO SQ INJECTION 3.5 MG (2.5MG/ML)
1.5000 mg/m2 | Freq: Once | INTRAMUSCULAR | Status: AC
  Administered 2023-03-30: 3 mg via SUBCUTANEOUS
  Filled 2023-03-30: qty 1.2

## 2023-03-30 MED ORDER — DIPHENHYDRAMINE HCL 25 MG PO CAPS
50.0000 mg | ORAL_CAPSULE | Freq: Once | ORAL | Status: AC
  Administered 2023-03-30: 50 mg via ORAL
  Filled 2023-03-30: qty 2

## 2023-03-30 MED ORDER — PALONOSETRON HCL INJECTION 0.25 MG/5ML
0.2500 mg | Freq: Once | INTRAVENOUS | Status: AC
  Administered 2023-03-30: 0.25 mg via INTRAVENOUS
  Filled 2023-03-30: qty 5

## 2023-03-30 NOTE — Progress Notes (Signed)
Blood transfusion orders entered.

## 2023-03-30 NOTE — Progress Notes (Addendum)
Per Dr. Arbutus Ped , it is ok to treat pt today with Cytoxan, velcade and Daratumumab and creatinine of 6.59 and HGB =7.8.

## 2023-03-30 NOTE — Progress Notes (Signed)
[facility] [phone]   [phone]  OFFICE PROGRESS NOTE  [name], MD [address] [place] [address]  DIAGNOSIS: Recently diagnosed with multiple myeloma with lytic bone lesions in addition to renal insufficiency and bone marrow biopsy and aspirate consistent with plasma cell neoplasm with 53% plasma cells. This was diagnosed in August 2024.    PRIOR THERAPY:  Plasmapheresis status post ~4 treatments    CURRENT THERAPY: Systemic chemotherapy with subcutaneous Velcade 1.3 Mg/KG on days 1, 4, 8 and 11 every 3 weeks, cyclophosphamide 500 Mg/M2 IV on days 1 and 8 as well as Decadron 40 mg p.o. on weekly basis start with the first dose of Velcade.  He is status post 2 cycles.  Once his renal function improves, Dr. Arbutus Ped will likely recommend changing treatment to standard treatment with daratumumab, Velcade, Revlimid and Decadron for 4 cycles followed by evaluation for autologous stem cell transplant.  He is status post 3 cycles of the new regimen with daratumumab subcutaneously, cyclophosphamide, subcutaneous Velcade and dexamethasone every 4 weeks   INTERVAL HISTORY: GEANCARLO BERMUDES 57 y.o. male returns to the clinic today for follow-up visit accompanied by his wife [name]. Discussed the use of AI scribe software for clinical note transcription with the patient, who gave verbal consent to proceed.  History of Present Illness   The patient, a 57 year old diagnosed with multiple myeloma in August 2024, initially presented with severe renal dysfunction. Treatment was initiated with subcutaneous Velcade, Cytoxan (cyclophosphamide), and Decadron. After three rounds of this regimen, daratumumab was added. The patient has completed two cycles of the new regimen, which includes subcutaneous daratumumab, subcutaneous Velcade, Cytoxan, and Decadron, administered every four weeks.  Recently, the patient underwent a procedure for fistula placement  in the left arm for dialysis, which was initiated due to the ongoing renal dysfunction. The patient reports the procedure went well, with residual soreness in the arm. Dialysis sessions are scheduled for Tuesdays, Thursdays, and Saturdays.  The patient reports feeling fatigued but denies any other issues. The patient's hemoglobin level was noted to be 7.8, indicating possible anemia. Despite the challenges, the patient reports that the condition is not worsening and there is some improvement in kidney function.       MEDICAL HISTORY: Past Medical History:  Diagnosis Date   Cancer (HCC) 10/2022   multiple myeloma   Chronic kidney disease     ALLERGIES:  has no known allergies.  MEDICATIONS:  Current Outpatient Medications  Medication Sig Dispense Refill   acetaminophen (TYLENOL) 500 MG tablet Take 500 mg by mouth every 6 (six) hours as needed for moderate pain.     acyclovir (ZOVIRAX) 200 MG capsule Take 1 capsule (200 mg total) by mouth daily. (Patient not taking: Reported on 03/20/2023) 30 capsule 0   Calcium Carbonate (CALCIUM 600 PO) Take 600 mg by mouth 2 (two) times daily.     DARZALEX FASPRO 1800-30000 MG-UT/15ML SOLN Inject into the skin.     dexamethasone (DECADRON) 4 MG tablet Take 10 tablets by mouth once a week, skip 9/6 and take it 9/9 instead with treatment (Patient taking differently: Take 10 tablets by mouth once a week, skip 9/6 and take it 9/9 instead with treatment. PT STATES HE TOOK ON 12/05/22) 40 tablet 4   ferric citrate (AURYXIA) 1 GM 210 MG(Fe) tablet Take 2 tablets (420 mg total) by mouth 3 (three) times daily with meals. (Patient not taking: Reported on 03/20/2023) 180 tablet  0   ferrous sulfate 325 (65 FE) MG tablet Take 325 mg by mouth daily with breakfast.     furosemide (LASIX) 40 MG tablet Take 40 mg by mouth every other day.     gabapentin (NEURONTIN) 100 MG capsule `T1C BY MOUTH THREE TIMES DAILY 90 capsule 1   lidocaine (LIDODERM) 5 % Place 1 patch onto the  skin daily. (Patient not taking: Reported on 03/20/2023)     oxyCODONE (OXY IR/ROXICODONE) 5 MG immediate release tablet Take 1 tablet (5 mg total) by mouth every 6 (six) hours as needed for moderate pain (pain score 4-6) (for pain score of 1-4). 12 tablet 0   prochlorperazine (COMPAZINE) 10 MG tablet Take 1 tablet (10 mg total) by mouth every 6 (six) hours as needed. 30 tablet 2   sodium bicarbonate 650 MG tablet Take 1 tablet (650 mg total) by mouth 2 (two) times daily. (Patient not taking: Reported on 03/23/2023) 60 tablet 0   No current facility-administered medications for this visit.    SURGICAL HISTORY:  Past Surgical History:  Procedure Laterality Date   AV FISTULA PLACEMENT Left 03/26/2023   Procedure: LEFT ARM FISTULA CREATION;  Surgeon: Nada Libman, MD;  Location: [facility] OR;  Service: Vascular;  Laterality: Left;   IR FLUORO GUIDE CV LINE RIGHT  11/13/2022   IR US GUIDE VASC ACCESS RIGHT  11/13/2022   KNEE SURGERY      REVIEW OF SYSTEMS:  A comprehensive review of systems was negative except for: Constitutional: positive for fatigue Neurological: positive for paresthesia   PHYSICAL EXAMINATION: General appearance: alert, cooperative, fatigued, and no distress Head: Normocephalic, without obvious abnormality, atraumatic Neck: no adenopathy, no JVD, supple, symmetrical, trachea midline, and thyroid not enlarged, symmetric, no tenderness/mass/nodules Lymph nodes: Cervical, supraclavicular, and axillary nodes normal. Resp: clear to auscultation bilaterally Back: symmetric, no curvature. ROM normal. No CVA tenderness. Cardio: regular rate and rhythm, S1, S2 normal, no murmur, click, rub or gallop GI: soft, non-tender; bowel sounds normal; no masses,  no organomegaly Extremities: extremities normal, atraumatic, no cyanosis or edema  ECOG PERFORMANCE STATUS: 1 - Symptomatic but completely ambulatory  Blood pressure 134/82, pulse 89, temperature 97.9 F (36.6 C), resp. rate 16,  height 5\' 10"  (1.778 m), weight 183 lb 14.4 oz (83.4 kg), SpO2 100%.  LABORATORY DATA: Lab Results  Component Value Date   WBC 4.0 03/30/2023   HGB 7.8 (L) 03/30/2023   HCT 23.0 (L) 03/30/2023   MCV 90.9 03/30/2023   PLT 196 03/30/2023      Chemistry      Component Value Date/Time   NA 136 03/26/2023 0625   K 4.5 03/26/2023 0625   CL 107 03/26/2023 0625   CO2 19 (L) 03/16/2023 0924   BUN 76 (H) 03/26/2023 0625   CREATININE 8.10 (H) 03/26/2023 0625   CREATININE 7.69 (HH) 03/16/2023 0924      Component Value Date/Time   CALCIUM 8.5 (L) 03/16/2023 0924   ALKPHOS 94 03/16/2023 0924   AST 15 03/16/2023 0924   ALT 9 03/16/2023 0924   BILITOT 0.3 03/16/2023 0924       RADIOGRAPHIC STUDIES: No results found.  ASSESSMENT AND PLAN: This is a very pleasant 57 years old white male with multiple myeloma with lytic bone lesions in addition to renal insufficiency and bone marrow biopsy and aspirate consistent with plasma cell neoplasm with 53% plasma cells. This was diagnosed in August 2024. He is currently undergoing systemic chemotherapy with subcutaneous Velcade 1.3 Mg/KG on  days 1, 4, 8 and 11 every 3 weeks, cyclophosphamide 500 Mg/M2 IV on days 1 and 8 as well as Decadron 40 mg p.o. on weekly basis start with the first dose of Velcade.  He is status post 3 cycles.  Unfortunately no improvement in his renal function so far He was referred to Dr. [name] at [facility] who recommended adding daratumumab to his current regimen with the hope for improvement of his renal function.  He is status post 2 cycles of the new regimen.  The patient has been tolerating this treatment well except for fatigue.    Multiple Myeloma Diagnosed in August 2024. Currently undergoing treatment with subcutaneous Velcade, Cytoxan (cyclophosphamide), Decadron, and daratumumab. Completed two cycles of the new regimen. Kidney function shows some improvement, but it is a long-term process requiring ongoing  monitoring. - Proceed with cycle three of the current regimen - Repeat myeloma panel in three weeks - Schedule follow-up appointment in four weeks  Chronic Kidney Disease Severe renal impairment secondary to multiple myeloma. Newly placed fistula in the left arm and started dialysis. Dialysis schedule is Tuesdays, Thursdays, and Saturdays. Discussed the need for dialysis and schedule adjustments due to holidays. - Continue dialysis as scheduled  Anemia Hemoglobin level is 7.8 g/dL. Discussed the need for a blood transfusion due to low hemoglobin levels. - Administer one unit of blood.   He was advised to call immediately if he has any other concerning symptoms in the interval. The patient voices understanding of current disease status and treatment options and is in agreement with the current care plan.  All questions were answered. The patient knows to call the clinic with any problems, questions or concerns. We can certainly see the patient much sooner if necessary.  The total time spent in the appointment was 20 minutes.  Disclaimer: This note was dictated with voice recognition software. Similar sounding words can inadvertently be transcribed and may not be corrected upon review.

## 2023-03-30 NOTE — Patient Instructions (Signed)
[facility] - A DEPT OF [facility]  Discharge Instructions: Thank you for choosing [facility] to provide your oncology and hematology care.   If you have a lab appointment with the Cancer Center, please go directly to the Cancer Center and check in at the registration area.   Wear comfortable clothing and clothing appropriate for easy access to any Portacath or PICC line.   We strive to give you quality time with your provider. You may need to reschedule your appointment if you arrive late (15 or more minutes).  Arriving late affects you and other patients whose appointments are after yours.  Also, if you miss three or more appointments without notifying the office, you may be dismissed from the clinic at the provider's discretion.      For prescription refill requests, have your pharmacy contact our office and allow 72 hours for refills to be completed.    Today you received the following chemotherapy and/or immunotherapy agents: Cytoxan, Velcade, Darzalex faspro      To help prevent nausea and vomiting after your treatment, we encourage you to take your nausea medication as directed.  BELOW ARE SYMPTOMS THAT SHOULD BE REPORTED IMMEDIATELY: *FEVER GREATER THAN 100.4 F (38 C) OR HIGHER *CHILLS OR SWEATING *NAUSEA AND VOMITING THAT IS NOT CONTROLLED WITH YOUR NAUSEA MEDICATION *UNUSUAL SHORTNESS OF BREATH *UNUSUAL BRUISING OR BLEEDING *URINARY PROBLEMS (pain or burning when urinating, or frequent urination) *BOWEL PROBLEMS (unusual diarrhea, constipation, pain near the anus) TENDERNESS IN MOUTH AND THROAT WITH OR WITHOUT PRESENCE OF ULCERS (sore throat, sores in mouth, or a toothache) UNUSUAL RASH, SWELLING OR PAIN  UNUSUAL VAGINAL DISCHARGE OR ITCHING   Items with * indicate a potential emergency and should be followed up as soon as possible or go to the Emergency Department if any problems should occur.  Please show the CHEMOTHERAPY  ALERT CARD or IMMUNOTHERAPY ALERT CARD at check-in to the Emergency Department and triage nurse.  Should you have questions after your visit or need to cancel or reschedule your appointment, please contact [facility]  Dept: [phone]  and follow the prompts.  Office hours are 8:00 a.m. to 4:30 p.m. Monday - Friday. Please note that voicemails left after 4:00 p.m. may not be returned until the following business day.  We are closed weekends and major holidays. You have access to a nurse at all times for urgent questions. Please call the main number to the clinic Dept: [phone] and follow the prompts.   For any non-urgent questions, you may also contact your provider using MyChart. We now offer e-Visits for anyone 65 and older to request care online for non-urgent symptoms. For details visit [email or web address].   Also download the MyChart app! Go to the app store, search "MyChart", open the app, select [place], and log in with your MyChart username and password.

## 2023-03-31 ENCOUNTER — Other Ambulatory Visit: Payer: Self-pay

## 2023-03-31 ENCOUNTER — Inpatient Hospital Stay: Payer: Self-pay

## 2023-03-31 DIAGNOSIS — D649 Anemia, unspecified: Secondary | ICD-10-CM

## 2023-03-31 DIAGNOSIS — Z5112 Encounter for antineoplastic immunotherapy: Secondary | ICD-10-CM | POA: Diagnosis not present

## 2023-03-31 MED ORDER — SODIUM CHLORIDE 0.9% IV SOLUTION
250.0000 mL | INTRAVENOUS | Status: DC
  Administered 2023-03-31: 100 mL via INTRAVENOUS

## 2023-03-31 MED ORDER — DIPHENHYDRAMINE HCL 25 MG PO CAPS
25.0000 mg | ORAL_CAPSULE | Freq: Once | ORAL | Status: AC
  Administered 2023-03-31: 25 mg via ORAL
  Filled 2023-03-31: qty 1

## 2023-03-31 MED ORDER — ACETAMINOPHEN 325 MG PO TABS
650.0000 mg | ORAL_TABLET | Freq: Once | ORAL | Status: AC
  Administered 2023-03-31: 650 mg via ORAL
  Filled 2023-03-31: qty 2

## 2023-03-31 NOTE — Patient Instructions (Signed)
 Blood Transfusion, Adult A blood transfusion is a procedure in which you receive blood or a type of blood cell (blood component) through an IV. You may need a blood transfusion when you have a low blood count, which is a low number of any blood cell. This may result from a bleeding disorder, illness, injury, or surgery. The blood may come from a donor, or you may be able to have your own blood collected and stored (autologous blood donation) before a planned surgery. The blood given in a transfusion may be made up of different blood components. You may receive: Red blood cells. These carry oxygen to the cells in the body. Platelets. These help your blood to clot. Plasma. This is the liquid part of your blood. It carries proteins and other substances throughout the body. White blood cells. These help you fight infections. If you have hemophilia or another clotting disorder, you may also receive other types of blood products. Depending on the type of blood product, this procedure may take 1-4 hours to complete. Tell a health care provider about: Any bleeding problems you have. Any previous reactions you have had during a blood transfusion. Any allergies you have. All medicines you are taking, including vitamins, herbs, eye drops, creams, and over-the-counter medicines. Any surgeries you have had. Any medical conditions you have. Whether you are pregnant or may be pregnant. What are the risks? Talk with your health care provider about risks. The most common problems include: A mild allergic reaction, such as red, swollen areas of skin (hives) and itching. Fever or chills. This may be the body's response to new blood cells received. This may occur during or up to 4 hours after the transfusion. More serious problems may include: A serious allergic reaction that causes difficulty breathing or swelling around the face and lips. Transfusion-associated circulatory overload (TACO), or too much fluid in  the lungs. This may cause breathing problems. Transfusion-related acute lung injury (TRALI), which causes breathing difficulty and low oxygen in the blood. This can occur within hours of the transfusion or several days later. Iron overload. This can happen after receiving many blood transfusions over a period of time. Infection or virus being transmitted. This is rare because donated blood is carefully tested before it is given. Hemolytic transfusion reaction. This is rare. It happens when the body's defense system (immune system)tries to attack the new blood cells. Symptoms may include fever, chills, nausea, low blood pressure, and low back or chest pain. Transfusion-associated graft-versus-host disease (TAGVHD). This is rare. It happens when donated cells attack the body's healthy tissues. What happens before the procedure? You will have a blood test to check your blood type. This test is done to know what kind of blood your body will accept and to match it to the donor blood. If you are going to have a planned surgery, you may be able to do an autologous blood donation. This may be done in case you need to have a transfusion. You will have your temperature, blood pressure, and pulse checked before the transfusion. If you have had an allergic reaction to a transfusion in the past, you may be given medicine to help prevent a reaction. This medicine may be given to you by mouth (orally) or through an IV. What happens during the procedure?  An IV will be inserted into one of your veins. The bag of blood will be attached to your IV. The blood will then enter through your vein. Your temperature, blood pressure,  and pulse will be monitored during the transfusion. This monitoring is done to detect early signs of a transfusion reaction. Tell your nurse right away if you have any of these symptoms during the transfusion: Shortness of breath or trouble breathing. Chest or back pain. Fever or  chills. Itching or hives. If you have any signs or symptoms of a reaction, your transfusion will be stopped and you may be given medicine. When the transfusion is complete, your IV will be removed. Pressure may be applied to the IV site for a few minutes. A bandage (dressing)will be applied. The procedure may vary among health care providers and hospitals. What happens after the procedure? Your temperature, blood pressure, pulse, breathing rate, and blood oxygen level will be monitored until you leave the hospital or clinic. Your blood may be tested to see how you have responded to the transfusion. You may be warmed with fluids or blankets to maintain a normal body temperature. If you receive your blood transfusion in an outpatient setting, you will be told whom to contact to report any reactions. Where to find more information Visit the [employer]: redcross.org Summary A blood transfusion is a procedure in which you receive blood or a type of blood cell (blood component) through an IV. The blood given in a transfusion may be made up of different blood components. You may receive red blood cells, platelets, plasma, or white blood cells depending on the condition treated. Your temperature, blood pressure, and pulse will be monitored before, during, and after the transfusion. After the transfusion, your blood may be tested to see how your body has responded. This information is not intended to replace advice given to you by your health care provider. Make sure you discuss any questions you have with your health care provider. Document Revised: 06/14/2021 Document Reviewed: 06/14/2021 Elsevier Patient Education  2024 [employer].

## 2023-04-01 ENCOUNTER — Other Ambulatory Visit: Payer: Self-pay

## 2023-04-01 LAB — TYPE AND SCREEN
ABO/RH(D): AB POS
Antibody Screen: POSITIVE
Donor AG Type: NEGATIVE
Unit division: 0

## 2023-04-01 LAB — BPAM RBC
Blood Product Expiration Date: 202501292359
ISSUE DATE / TIME: 202412311146
Unit Type and Rh: 202501292359
Unit Type and Rh: 6200

## 2023-04-06 ENCOUNTER — Inpatient Hospital Stay: Payer: Medicaid Other

## 2023-04-06 ENCOUNTER — Inpatient Hospital Stay: Payer: Medicaid Other | Attending: Internal Medicine

## 2023-04-06 VITALS — BP 153/81 | HR 87 | Temp 97.8°F | Resp 18 | Wt 188.8 lb

## 2023-04-06 DIAGNOSIS — Z5112 Encounter for antineoplastic immunotherapy: Secondary | ICD-10-CM | POA: Insufficient documentation

## 2023-04-06 DIAGNOSIS — D649 Anemia, unspecified: Secondary | ICD-10-CM | POA: Diagnosis not present

## 2023-04-06 DIAGNOSIS — Z7969 Long term (current) use of other immunomodulators and immunosuppressants: Secondary | ICD-10-CM | POA: Diagnosis not present

## 2023-04-06 DIAGNOSIS — C9000 Multiple myeloma not having achieved remission: Secondary | ICD-10-CM | POA: Insufficient documentation

## 2023-04-06 DIAGNOSIS — Z992 Dependence on renal dialysis: Secondary | ICD-10-CM | POA: Insufficient documentation

## 2023-04-06 DIAGNOSIS — Z5111 Encounter for antineoplastic chemotherapy: Secondary | ICD-10-CM | POA: Diagnosis present

## 2023-04-06 DIAGNOSIS — N186 End stage renal disease: Secondary | ICD-10-CM | POA: Diagnosis not present

## 2023-04-06 LAB — CMP (CANCER CENTER ONLY)
ALT: 7 U/L (ref 0–44)
AST: 14 U/L — ABNORMAL LOW (ref 15–41)
Albumin: 3.9 g/dL (ref 3.5–5.0)
Alkaline Phosphatase: 90 U/L (ref 38–126)
Anion gap: 11 (ref 5–15)
BUN: 57 mg/dL — ABNORMAL HIGH (ref 6–20)
CO2: 24 mmol/L (ref 22–32)
Calcium: 8.4 mg/dL — ABNORMAL LOW (ref 8.9–10.3)
Chloride: 101 mmol/L (ref 98–111)
Creatinine: 6.43 mg/dL — ABNORMAL HIGH (ref 0.61–1.24)
GFR, Estimated: 9 mL/min — ABNORMAL LOW (ref >60)
Glucose, Bld: 103 mg/dL — ABNORMAL HIGH (ref 70–99)
Potassium: 4.2 mmol/L (ref 3.5–5.1)
Sodium: 136 mmol/L (ref 135–145)
Total Bilirubin: 0.3 mg/dL (ref 0.0–1.2)
Total Protein: 6.1 g/dL — ABNORMAL LOW (ref 6.5–8.1)

## 2023-04-06 LAB — CBC WITH DIFFERENTIAL (CANCER CENTER ONLY)
Abs Immature Granulocytes: 0.04 10*3/uL (ref 0.00–0.07)
Basophils Absolute: 0.1 10*3/uL (ref 0.0–0.1)
Basophils Relative: 2 %
Eosinophils Absolute: 0.1 10*3/uL (ref 0.0–0.5)
Eosinophils Relative: 3 %
HCT: 24.9 % — ABNORMAL LOW (ref 39.0–52.0)
Hemoglobin: 8.4 g/dL — ABNORMAL LOW (ref 13.0–17.0)
Immature Granulocytes: 1 %
Lymphocytes Relative: 9 %
Lymphs Abs: 0.4 10*3/uL — ABNORMAL LOW (ref 0.7–4.0)
MCH: 31 pg (ref 26.0–34.0)
MCHC: 33.7 g/dL (ref 30.0–36.0)
MCV: 91.9 fL (ref 80.0–100.0)
Monocytes Absolute: 0.4 10*3/uL (ref 0.1–1.0)
Monocytes Relative: 9 %
Neutro Abs: 3.2 10*3/uL (ref 1.7–7.7)
Neutrophils Relative %: 76 %
Platelet Count: 155 10*3/uL (ref 150–400)
RBC: 2.71 MIL/uL — ABNORMAL LOW (ref 4.22–5.81)
RDW: 15 % (ref 11.5–15.5)
WBC Count: 4.2 10*3/uL (ref 4.0–10.5)
nRBC: 0 % (ref 0.0–0.2)

## 2023-04-06 MED ORDER — SODIUM CHLORIDE 0.9 % IV SOLN
500.0000 mg/m2 | Freq: Once | INTRAVENOUS | Status: AC
  Administered 2023-04-06: 1000 mg via INTRAVENOUS
  Filled 2023-04-06: qty 50

## 2023-04-06 MED ORDER — PALONOSETRON HCL INJECTION 0.25 MG/5ML
0.2500 mg | Freq: Once | INTRAVENOUS | Status: AC
  Administered 2023-04-06: 0.25 mg via INTRAVENOUS
  Filled 2023-04-06: qty 5

## 2023-04-06 MED ORDER — BORTEZOMIB CHEMO SQ INJECTION 3.5 MG (2.5MG/ML)
1.5000 mg/m2 | Freq: Once | INTRAMUSCULAR | Status: AC
  Administered 2023-04-06: 3 mg via SUBCUTANEOUS
  Filled 2023-04-06: qty 1.2

## 2023-04-06 MED ORDER — DEXAMETHASONE 4 MG PO TABS
40.0000 mg | ORAL_TABLET | Freq: Once | ORAL | Status: AC
  Administered 2023-04-06: 40 mg via ORAL
  Filled 2023-04-06: qty 10

## 2023-04-06 NOTE — Patient Instructions (Signed)
 [facility] - A DEPT OF [facility]  Discharge Instructions: Thank you for choosing [facility] to provide your oncology and hematology care.   If you have a lab appointment with the Cancer Center, please go directly to the Cancer Center and check in at the registration area.   Wear comfortable clothing and clothing appropriate for easy access to any Portacath or PICC line.   We strive to give you quality time with your provider. You may need to reschedule your appointment if you arrive late (15 or more minutes).  Arriving late affects you and other patients whose appointments are after yours.  Also, if you miss three or more appointments without notifying the office, you may be dismissed from the clinic at the provider's discretion.      For prescription refill requests, have your pharmacy contact our office and allow 72 hours for refills to be completed.    Today you received the following chemotherapy and/or immunotherapy agents: Velcade, Cytoxan      To help prevent nausea and vomiting after your treatment, we encourage you to take your nausea medication as directed.  BELOW ARE SYMPTOMS THAT SHOULD BE REPORTED IMMEDIATELY: *FEVER GREATER THAN 100.4 F (38 C) OR HIGHER *CHILLS OR SWEATING *NAUSEA AND VOMITING THAT IS NOT CONTROLLED WITH YOUR NAUSEA MEDICATION *UNUSUAL SHORTNESS OF BREATH *UNUSUAL BRUISING OR BLEEDING *URINARY PROBLEMS (pain or burning when urinating, or frequent urination) *BOWEL PROBLEMS (unusual diarrhea, constipation, pain near the anus) TENDERNESS IN MOUTH AND THROAT WITH OR WITHOUT PRESENCE OF ULCERS (sore throat, sores in mouth, or a toothache) UNUSUAL RASH, SWELLING OR PAIN  UNUSUAL VAGINAL DISCHARGE OR ITCHING   Items with * indicate a potential emergency and should be followed up as soon as possible or go to the Emergency Department if any problems should occur.  Please show the CHEMOTHERAPY ALERT CARD or  IMMUNOTHERAPY ALERT CARD at check-in to the Emergency Department and triage nurse.  Should you have questions after your visit or need to cancel or reschedule your appointment, please contact [facility]  Dept: [phone]  and follow the prompts.  Office hours are 8:00 a.m. to 4:30 p.m. Monday - Friday. Please note that voicemails left after 4:00 p.m. may not be returned until the following business day.  We are closed weekends and major holidays. You have access to a nurse at all times for urgent questions. Please call the main number to the clinic Dept: [phone] and follow the prompts.   For any non-urgent questions, you may also contact your provider using MyChart. We now offer e-Visits for anyone 64 and older to request care online for non-urgent symptoms. For details visit [email or web address].   Also download the MyChart app! Go to the app store, search "MyChart", open the app, select [place], and log in with your MyChart username and password.

## 2023-04-06 NOTE — Progress Notes (Signed)
 Per Arbutus Ped MD, ok to treat today with SCR 6.43

## 2023-04-13 ENCOUNTER — Inpatient Hospital Stay: Payer: Medicaid Other

## 2023-04-13 ENCOUNTER — Encounter: Payer: Self-pay | Admitting: Medical Oncology

## 2023-04-13 ENCOUNTER — Other Ambulatory Visit: Payer: Self-pay | Admitting: Medical Oncology

## 2023-04-13 ENCOUNTER — Encounter: Payer: Self-pay | Admitting: Internal Medicine

## 2023-04-13 ENCOUNTER — Inpatient Hospital Stay (HOSPITAL_BASED_OUTPATIENT_CLINIC_OR_DEPARTMENT_OTHER): Payer: Medicaid Other | Admitting: Internal Medicine

## 2023-04-13 VITALS — BP 133/73 | HR 83 | Temp 98.0°F | Resp 16 | Ht 70.0 in | Wt 188.0 lb

## 2023-04-13 DIAGNOSIS — C9000 Multiple myeloma not having achieved remission: Secondary | ICD-10-CM

## 2023-04-13 DIAGNOSIS — Z5112 Encounter for antineoplastic immunotherapy: Secondary | ICD-10-CM | POA: Diagnosis not present

## 2023-04-13 DIAGNOSIS — D649 Anemia, unspecified: Secondary | ICD-10-CM

## 2023-04-13 LAB — CMP (CANCER CENTER ONLY)
ALT: 8 U/L (ref 0–44)
AST: 17 U/L (ref 15–41)
Albumin: 3.9 g/dL (ref 3.5–5.0)
Alkaline Phosphatase: 85 U/L (ref 38–126)
Anion gap: 13 (ref 5–15)
BUN: 56 mg/dL — ABNORMAL HIGH (ref 6–20)
CO2: 25 mmol/L (ref 22–32)
Calcium: 8.2 mg/dL — ABNORMAL LOW (ref 8.9–10.3)
Chloride: 97 mmol/L — ABNORMAL LOW (ref 98–111)
Creatinine: 6.71 mg/dL — ABNORMAL HIGH (ref 0.61–1.24)
GFR, Estimated: 9 mL/min — ABNORMAL LOW (ref >60)
Glucose, Bld: 104 mg/dL — ABNORMAL HIGH (ref 70–99)
Potassium: 3.9 mmol/L (ref 3.5–5.1)
Sodium: 135 mmol/L (ref 135–145)
Total Bilirubin: 0.4 mg/dL (ref 0.0–1.2)
Total Protein: 6 g/dL — ABNORMAL LOW (ref 6.5–8.1)

## 2023-04-13 LAB — CBC WITH DIFFERENTIAL (CANCER CENTER ONLY)
Abs Immature Granulocytes: 0.03 10*3/uL (ref 0.00–0.07)
Basophils Absolute: 0.1 10*3/uL (ref 0.0–0.1)
Basophils Relative: 2 %
Eosinophils Absolute: 0.1 10*3/uL (ref 0.0–0.5)
Eosinophils Relative: 2 %
HCT: 22.1 % — ABNORMAL LOW (ref 39.0–52.0)
Hemoglobin: 7.5 g/dL — ABNORMAL LOW (ref 13.0–17.0)
Immature Granulocytes: 1 %
Lymphocytes Relative: 6 %
Lymphs Abs: 0.3 10*3/uL — ABNORMAL LOW (ref 0.7–4.0)
MCH: 30.7 pg (ref 26.0–34.0)
MCHC: 33.9 g/dL (ref 30.0–36.0)
MCV: 90.6 fL (ref 80.0–100.0)
Monocytes Absolute: 0.4 10*3/uL (ref 0.1–1.0)
Monocytes Relative: 9 %
Neutro Abs: 3.5 10*3/uL (ref 1.7–7.7)
Neutrophils Relative %: 80 %
Platelet Count: 125 10*3/uL — ABNORMAL LOW (ref 150–400)
RBC: 2.44 MIL/uL — ABNORMAL LOW (ref 4.22–5.81)
RDW: 14.9 % (ref 11.5–15.5)
WBC Count: 4.4 10*3/uL (ref 4.0–10.5)
nRBC: 0 % (ref 0.0–0.2)

## 2023-04-13 LAB — PREPARE RBC (CROSSMATCH)

## 2023-04-13 LAB — SAMPLE TO BLOOD BANK

## 2023-04-13 MED ORDER — DARATUMUMAB-HYALURONIDASE-FIHJ 1800-30000 MG-UT/15ML ~~LOC~~ SOLN
1800.0000 mg | Freq: Once | SUBCUTANEOUS | Status: AC
  Administered 2023-04-13: 1800 mg via SUBCUTANEOUS
  Filled 2023-04-13: qty 15

## 2023-04-13 MED ORDER — DIPHENHYDRAMINE HCL 25 MG PO CAPS
50.0000 mg | ORAL_CAPSULE | Freq: Once | ORAL | Status: AC
  Administered 2023-04-13: 50 mg via ORAL
  Filled 2023-04-13: qty 2

## 2023-04-13 MED ORDER — DEXAMETHASONE 4 MG PO TABS
40.0000 mg | ORAL_TABLET | Freq: Once | ORAL | Status: AC
  Administered 2023-04-13: 40 mg via ORAL
  Filled 2023-04-13: qty 10

## 2023-04-13 MED ORDER — BORTEZOMIB CHEMO SQ INJECTION 3.5 MG (2.5MG/ML)
1.5000 mg/m2 | Freq: Once | INTRAMUSCULAR | Status: AC
  Administered 2023-04-13: 3 mg via SUBCUTANEOUS
  Filled 2023-04-13: qty 1.2

## 2023-04-13 MED ORDER — SODIUM CHLORIDE 0.9 % IV SOLN
INTRAVENOUS | Status: DC
Start: 2023-04-13 — End: 2023-04-13

## 2023-04-13 MED ORDER — PALONOSETRON HCL INJECTION 0.25 MG/5ML
0.2500 mg | Freq: Once | INTRAVENOUS | Status: AC
  Administered 2023-04-13: 0.25 mg via INTRAVENOUS
  Filled 2023-04-13: qty 5

## 2023-04-13 MED ORDER — SODIUM CHLORIDE 0.9 % IV SOLN
500.0000 mg/m2 | Freq: Once | INTRAVENOUS | Status: AC
  Administered 2023-04-13: 1000 mg via INTRAVENOUS
  Filled 2023-04-13: qty 50

## 2023-04-13 MED ORDER — ACETAMINOPHEN 325 MG PO TABS
650.0000 mg | ORAL_TABLET | Freq: Once | ORAL | Status: AC
  Administered 2023-04-13: 650 mg via ORAL
  Filled 2023-04-13: qty 2

## 2023-04-13 NOTE — Progress Notes (Signed)
 [facility] [phone]   [phone]  OFFICE PROGRESS NOTE  [name], MD [address] [place] [address]  DIAGNOSIS: Recently diagnosed with multiple myeloma with lytic bone lesions in addition to renal insufficiency and bone marrow biopsy and aspirate consistent with plasma cell neoplasm with 53% plasma cells. This was diagnosed in August 2024.    PRIOR THERAPY:  Plasmapheresis status post ~4 treatments    CURRENT THERAPY: Systemic chemotherapy with subcutaneous Velcade  1.3 Mg/KG on days 1, 4, 8 and 11 every 3 weeks, cyclophosphamide  500 Mg/M2 IV on days 1 and 8 as well as Decadron  40 mg p.o. on weekly basis start with the first dose of Velcade .  He is status post 2 cycles.  Once his renal function improves, Dr. Sherrod will likely recommend changing treatment to standard treatment with daratumumab , Velcade , Revlimid and Decadron  for 4 cycles followed by evaluation for autologous stem cell transplant.  He is status post 3 cycles of the new regimen with daratumumab  subcutaneously, cyclophosphamide , subcutaneous Velcade  and dexamethasone  every 4 weeks   INTERVAL HISTORY: Drew Cooper 58 y.o. male returns to the clinic today for follow-up visit. Discussed the use of AI scribe software for clinical note transcription with the patient, who gave verbal consent to proceed.  History of Present Illness   The patient, a 58 year old with a history of multiple myeloma, was started on a regimen of cyclophosphamide , bortezomib , and dexamethasone  in August 2024. Subsequently, daratumumab  was added to the regimen. The patient is currently on the third cycle of this new regimen and reports no new side effects from the treatment. However, he experiences persistent pain in both calves, which is not associated with any swelling. The patient also reports no numbness in the fingers or any other neuropathic symptoms. He denies any gastrointestinal symptoms  such as nausea, vomiting, or diarrhea, and also denies any chest pain or breathing issues.  The patient is also on dialysis, which he reports is going well, although the early morning schedule is challenging. Despite the ongoing treatment and dialysis, the patient maintains a good appetite and has even gained a few pounds. He also reports taking prescribed pain medication, which provides some relief, but the leg pain persists. The patient's hemoglobin level is noted to be 7.5, and he is scheduled to receive a unit of blood.       MEDICAL HISTORY: Past Medical History:  Diagnosis Date   Cancer (HCC) 10/2022   multiple myeloma   Chronic kidney disease     ALLERGIES:  has no known allergies.  MEDICATIONS:  Current Outpatient Medications  Medication Sig Dispense Refill   acetaminophen  (TYLENOL ) 500 MG tablet Take 500 mg by mouth every 6 (six) hours as needed for moderate pain.     acyclovir  (ZOVIRAX ) 200 MG capsule Take 1 capsule (200 mg total) by mouth daily. (Patient not taking: Reported on 03/20/2023) 30 capsule 0   Calcium  Carbonate (CALCIUM  600 PO) Take 600 mg by mouth 2 (two) times daily.     DARZALEX  FASPRO 1800-30000 MG-UT/15ML SOLN Inject into the skin.     dexamethasone  (DECADRON ) 4 MG tablet Take 10 tablets by mouth once a week, skip 9/6 and take it 9/9 instead with treatment (Patient taking differently: Take 10 tablets by mouth once a week, skip 9/6 and take it 9/9 instead with treatment. PT STATES HE TOOK ON 12/05/22) 40 tablet 4   ferric citrate  (AURYXIA ) 1 GM 210 MG(Fe) tablet Take 2  tablets (420 mg total) by mouth 3 (three) times daily with meals. (Patient not taking: Reported on 03/20/2023) 180 tablet 0   ferrous sulfate 325 (65 FE) MG tablet Take 325 mg by mouth daily with breakfast.     furosemide  (LASIX ) 40 MG tablet Take 40 mg by mouth every other day.     gabapentin  (NEURONTIN ) 100 MG capsule `T1C BY MOUTH THREE TIMES DAILY 90 capsule 1   lidocaine  (LIDODERM ) 5 % Place 1  patch onto the skin daily. (Patient not taking: Reported on 03/20/2023)     oxyCODONE  (OXY IR/ROXICODONE ) 5 MG immediate release tablet Take 1 tablet (5 mg total) by mouth every 6 (six) hours as needed for moderate pain (pain score 4-6) (for pain score of 1-4). 12 tablet 0   prochlorperazine  (COMPAZINE ) 10 MG tablet Take 1 tablet (10 mg total) by mouth every 6 (six) hours as needed. 30 tablet 2   sodium bicarbonate  650 MG tablet Take 1 tablet (650 mg total) by mouth 2 (two) times daily. (Patient not taking: Reported on 03/23/2023) 60 tablet 0   No current facility-administered medications for this visit.    SURGICAL HISTORY:  Past Surgical History:  Procedure Laterality Date   AV FISTULA PLACEMENT Left 03/26/2023   Procedure: LEFT ARM FISTULA CREATION;  Surgeon: Serene Gaile ORN, MD;  Location: [facility] OR;  Service: Vascular;  Laterality: Left;   IR FLUORO GUIDE CV LINE RIGHT  11/13/2022   IR US  GUIDE VASC ACCESS RIGHT  11/13/2022   KNEE SURGERY      REVIEW OF SYSTEMS:  A comprehensive review of systems was negative except for: Constitutional: positive for fatigue   PHYSICAL EXAMINATION: General appearance: alert, cooperative, fatigued, and no distress Head: Normocephalic, without obvious abnormality, atraumatic Neck: no adenopathy, no JVD, supple, symmetrical, trachea midline, and thyroid not enlarged, symmetric, no tenderness/mass/nodules Lymph nodes: Cervical, supraclavicular, and axillary nodes normal. Resp: clear to auscultation bilaterally Back: symmetric, no curvature. ROM normal. No CVA tenderness. Cardio: regular rate and rhythm, S1, S2 normal, no murmur, click, rub or gallop GI: soft, non-tender; bowel sounds normal; no masses,  no organomegaly Extremities: extremities normal, atraumatic, no cyanosis or edema  ECOG PERFORMANCE STATUS: 1 - Symptomatic but completely ambulatory  Blood pressure 133/73, pulse 83, temperature 98 F (36.7 C), temperature source Temporal, resp. rate 16,  height 5' 10 (1.778 m), weight 188 lb (85.3 kg), SpO2 100%.  LABORATORY DATA: Lab Results  Component Value Date   WBC 4.4 04/13/2023   HGB 7.5 (L) 04/13/2023   HCT 22.1 (L) 04/13/2023   MCV 90.6 04/13/2023   PLT 125 (L) 04/13/2023      Chemistry      Component Value Date/Time   NA 136 04/06/2023 0848   K 4.2 04/06/2023 0848   CL 101 04/06/2023 0848   CO2 24 04/06/2023 0848   BUN 57 (H) 04/06/2023 0848   CREATININE 6.43 (H) 04/06/2023 0848      Component Value Date/Time   CALCIUM  8.4 (L) 04/06/2023 0848   ALKPHOS 90 04/06/2023 0848   AST 14 (L) 04/06/2023 0848   ALT 7 04/06/2023 0848   BILITOT 0.3 04/06/2023 0848       RADIOGRAPHIC STUDIES: No results found.  ASSESSMENT AND PLAN: This is a very pleasant 58 years old white male with multiple myeloma with lytic bone lesions in addition to renal insufficiency and bone marrow biopsy and aspirate consistent with plasma cell neoplasm with 53% plasma cells. This was diagnosed in August 2024. He  is currently undergoing systemic chemotherapy with subcutaneous Velcade  1.3 Mg/KG on days 1, 4, 8 and 11 every 3 weeks, cyclophosphamide  500 Mg/M2 IV on days 1 and 8 as well as Decadron  40 mg p.o. on weekly basis start with the first dose of Velcade .  He is status post 3 cycles.  Unfortunately no improvement in his renal function so far He was referred to Dr. Fernande at [facility] who recommended adding daratumumab  to his current regimen with the hope for improvement of his renal function.  He is status post 3 cycles of the new regimen.  The patient has been tolerating this treatment well except for fatigue.  Multiple Myeloma Diagnosed in August 2024. Currently on cycle three of bortezomib , cyclophosphamide , dexamethasone , and daratumumab . No new side effects. Leg pain managed with medication. No swelling, neuropathy, nausea, vomiting, diarrhea, chest pain, or dyspnea. Good appetite and stable weight. Plan to continue current regimen  and adjust follow-up visits to every other week. - Continue current treatment regimen - Schedule follow-up visits every other week - Omit day eight visits in each cycle  Anemia Hemoglobin at 7.5 g/dL. Discussed need for transfusion to manage anemia and improve hemoglobin levels. - Arrange for one unit of blood transfusion  End-Stage Renal Disease (ESRD) on Dialysis Undergoing morning dialysis sessions without issues. Emphasized importance of continuing current dialysis schedule. - Continue current dialysis schedule  Follow-up - Schedule next visit in two weeks.   The patient was advised to call immediately if he has any other concerning symptoms in the interval. The patient voices understanding of current disease status and treatment options and is in agreement with the current care plan.  All questions were answered. The patient knows to call the clinic with any problems, questions or concerns. We can certainly see the patient much sooner if necessary.  The total time spent in the appointment was 20 minutes.  Disclaimer: This note was dictated with voice recognition software. Similar sounding words can inadvertently be transcribed and may not be corrected upon review.

## 2023-04-15 ENCOUNTER — Inpatient Hospital Stay: Payer: Medicaid Other

## 2023-04-15 ENCOUNTER — Other Ambulatory Visit: Payer: Self-pay

## 2023-04-15 DIAGNOSIS — D649 Anemia, unspecified: Secondary | ICD-10-CM

## 2023-04-15 DIAGNOSIS — Z5112 Encounter for antineoplastic immunotherapy: Secondary | ICD-10-CM | POA: Diagnosis not present

## 2023-04-15 MED ORDER — ACETAMINOPHEN 325 MG PO TABS
650.0000 mg | ORAL_TABLET | Freq: Once | ORAL | Status: AC
Start: 2023-04-15 — End: 2023-04-15
  Administered 2023-04-15: 650 mg via ORAL
  Filled 2023-04-15: qty 2

## 2023-04-15 MED ORDER — DIPHENHYDRAMINE HCL 25 MG PO CAPS
25.0000 mg | ORAL_CAPSULE | Freq: Once | ORAL | Status: AC
  Administered 2023-04-15: 25 mg via ORAL
  Filled 2023-04-15: qty 1

## 2023-04-15 NOTE — Patient Instructions (Signed)
 Blood Transfusion, Adult A blood transfusion is a procedure in which you receive blood or a type of blood cell (blood component) through an IV. You may need a blood transfusion when you have a low blood count, which is a low number of any blood cell. This may result from a bleeding disorder, illness, injury, or surgery. The blood may come from a donor, or you may be able to have your own blood collected and stored (autologous blood donation) before a planned surgery. The blood given in a transfusion may be made up of different blood components. You may receive: Red blood cells. These carry oxygen to the cells in the body. Platelets. These help your blood to clot. Plasma. This is the liquid part of your blood. It carries proteins and other substances throughout the body. White blood cells. These help you fight infections. If you have hemophilia or another clotting disorder, you may also receive other types of blood products. Depending on the type of blood product, this procedure may take 1-4 hours to complete. Tell a health care provider about: Any bleeding problems you have. Any previous reactions you have had during a blood transfusion. Any allergies you have. All medicines you are taking, including vitamins, herbs, eye drops, creams, and over-the-counter medicines. Any surgeries you have had. Any medical conditions you have. Whether you are pregnant or may be pregnant. What are the risks? Talk with your health care provider about risks. The most common problems include: A mild allergic reaction, such as red, swollen areas of skin (hives) and itching. Fever or chills. This may be the body's response to new blood cells received. This may occur during or up to 4 hours after the transfusion. More serious problems may include: A serious allergic reaction that causes difficulty breathing or swelling around the face and lips. Transfusion-associated circulatory overload (TACO), or too much fluid in  the lungs. This may cause breathing problems. Transfusion-related acute lung injury (TRALI), which causes breathing difficulty and low oxygen in the blood. This can occur within hours of the transfusion or several days later. Iron overload. This can happen after receiving many blood transfusions over a period of time. Infection or virus being transmitted. This is rare because donated blood is carefully tested before it is given. Hemolytic transfusion reaction. This is rare. It happens when the body's defense system (immune system)tries to attack the new blood cells. Symptoms may include fever, chills, nausea, low blood pressure, and low back or chest pain. Transfusion-associated graft-versus-host disease (TAGVHD). This is rare. It happens when donated cells attack the body's healthy tissues. What happens before the procedure? You will have a blood test to check your blood type. This test is done to know what kind of blood your body will accept and to match it to the donor blood. If you are going to have a planned surgery, you may be able to do an autologous blood donation. This may be done in case you need to have a transfusion. You will have your temperature, blood pressure, and pulse checked before the transfusion. If you have had an allergic reaction to a transfusion in the past, you may be given medicine to help prevent a reaction. This medicine may be given to you by mouth (orally) or through an IV. What happens during the procedure?  An IV will be inserted into one of your veins. The bag of blood will be attached to your IV. The blood will then enter through your vein. Your temperature, blood pressure,  and pulse will be monitored during the transfusion. This monitoring is done to detect early signs of a transfusion reaction. Tell your nurse right away if you have any of these symptoms during the transfusion: Shortness of breath or trouble breathing. Chest or back pain. Fever or  chills. Itching or hives. If you have any signs or symptoms of a reaction, your transfusion will be stopped and you may be given medicine. When the transfusion is complete, your IV will be removed. Pressure may be applied to the IV site for a few minutes. A bandage (dressing)will be applied. The procedure may vary among health care providers and hospitals. What happens after the procedure? Your temperature, blood pressure, pulse, breathing rate, and blood oxygen level will be monitored until you leave the hospital or clinic. Your blood may be tested to see how you have responded to the transfusion. You may be warmed with fluids or blankets to maintain a normal body temperature. If you receive your blood transfusion in an outpatient setting, you will be told whom to contact to report any reactions. Where to find more information Visit the [employer]: redcross.org Summary A blood transfusion is a procedure in which you receive blood or a type of blood cell (blood component) through an IV. The blood given in a transfusion may be made up of different blood components. You may receive red blood cells, platelets, plasma, or white blood cells depending on the condition treated. Your temperature, blood pressure, and pulse will be monitored before, during, and after the transfusion. After the transfusion, your blood may be tested to see how your body has responded. This information is not intended to replace advice given to you by your health care provider. Make sure you discuss any questions you have with your health care provider. Document Revised: 06/14/2021 Document Reviewed: 06/14/2021 Elsevier Patient Education  2024 [employer].

## 2023-04-16 ENCOUNTER — Encounter: Payer: Self-pay | Admitting: Internal Medicine

## 2023-04-16 LAB — TYPE AND SCREEN
ABO/RH(D): AB POS
Antibody Screen: POSITIVE
Donor AG Type: NEGATIVE
Unit division: 0

## 2023-04-16 LAB — BPAM RBC
Blood Product Expiration Date: 202502032359
ISSUE DATE / TIME: 202501150850
Unit Type and Rh: 202502032359
Unit Type and Rh: 5100

## 2023-04-22 ENCOUNTER — Other Ambulatory Visit: Payer: Self-pay

## 2023-04-22 DIAGNOSIS — N186 End stage renal disease: Secondary | ICD-10-CM

## 2023-04-24 NOTE — Progress Notes (Unsigned)
[facility] OFFICE PROGRESS NOTE  [name], MD [address] [place] [address]  DIAGNOSIS: Multiple myeloma with lytic bone lesions in addition to renal insufficiency and bone marrow biopsy and aspirate consistent with plasma cell neoplasm with 53% plasma cells. This was diagnosed in August 2024.   PRIOR THERAPY: Plasmapheresis status post ~4 treatments   CURRENT THERAPY: First dose of treatment given while inpatient with  subcutaneous Velcade 1.3 Mg/KG on days 1, 4, 8 and 11 every 3 weeks, cyclophosphamide 500 Mg/M2 IV on days 1 and 8 as well as Decadron 40 mg p.o. on weekly basis start with the first dose of Velcade. Dr. Arbutus Ped added daratumumab. He is here for day 1 cycle #4 with Daratumumab, cytoxan, velcade, and dexamethasone.   INTERVAL HISTORY: Drew Cooper 58 y.o. male returnsto the clinic today for follow-up visit.  The patient was last seen by Dr. Arbutus Ped on 04/13/2023.  The patient is currently undergoing treatment for multiple myeloma.  Tolerates this well without any concerning adverse side effects.  The patient does struggle with persistent pain in his calves for which he takes ***.  He denies any fever, chills, night sweats, or unexplained weight loss.  Denies any chest pain, shortness of breath, cough, or hemoptysis.  The patient does struggle with frequent anemia patient does have end-stage renal disease and started dialysis which she is so far been tolerating well.  He denies any nausea, vomiting, diarrhea, or constipation.  Denies any signs or symptoms of infection.  Denies any abnormal bleeding or bruising.  He denies any unusual bone pain.  He is here today for evaluation repeat blood work before undergoing day 1 cycle 4.   MEDICAL HISTORY: Past Medical History:  Diagnosis Date   Cancer (HCC) 10/2022   multiple myeloma   Chronic kidney disease     ALLERGIES:  has no known allergies.  MEDICATIONS:  Current Outpatient Medications   Medication Sig Dispense Refill   acetaminophen (TYLENOL) 500 MG tablet Take 500 mg by mouth every 6 (six) hours as needed for moderate pain.     acyclovir (ZOVIRAX) 200 MG capsule Take 1 capsule (200 mg total) by mouth daily. (Patient not taking: Reported on 03/20/2023) 30 capsule 0   Calcium Carbonate (CALCIUM 600 PO) Take 600 mg by mouth 2 (two) times daily.     DARZALEX FASPRO 1800-30000 MG-UT/15ML SOLN Inject into the skin.     dexamethasone (DECADRON) 4 MG tablet Take 10 tablets by mouth once a week, skip 9/6 and take it 9/9 instead with treatment (Patient taking differently: Take 10 tablets by mouth once a week, skip 9/6 and take it 9/9 instead with treatment. PT STATES HE TOOK ON 12/05/22) 40 tablet 4   ferric citrate (AURYXIA) 1 GM 210 MG(Fe) tablet Take 2 tablets (420 mg total) by mouth 3 (three) times daily with meals. (Patient not taking: Reported on 03/20/2023) 180 tablet 0   ferrous sulfate 325 (65 FE) MG tablet Take 325 mg by mouth daily with breakfast.     furosemide (LASIX) 40 MG tablet Take 40 mg by mouth every other day.     gabapentin (NEURONTIN) 100 MG capsule `T1C BY MOUTH THREE TIMES DAILY 90 capsule 1   lidocaine (LIDODERM) 5 % Place 1 patch onto the skin daily. (Patient not taking: Reported on 03/20/2023)     oxyCODONE (OXY IR/ROXICODONE) 5 MG immediate release tablet Take 1 tablet (5 mg total) by mouth every 6 (six) hours as needed for  moderate pain (pain score 4-6) (for pain score of 1-4). 12 tablet 0   prochlorperazine (COMPAZINE) 10 MG tablet Take 1 tablet (10 mg total) by mouth every 6 (six) hours as needed. 30 tablet 2   sodium bicarbonate 650 MG tablet Take 1 tablet (650 mg total) by mouth 2 (two) times daily. (Patient not taking: Reported on 03/23/2023) 60 tablet 0   No current facility-administered medications for this visit.    SURGICAL HISTORY:  Past Surgical History:  Procedure Laterality Date   AV FISTULA PLACEMENT Left 03/26/2023   Procedure: LEFT ARM  FISTULA CREATION;  Surgeon: Nada Libman, MD;  Location: [facility] OR;  Service: Vascular;  Laterality: Left;   IR FLUORO GUIDE CV LINE RIGHT  11/13/2022   IR US GUIDE VASC ACCESS RIGHT  11/13/2022   KNEE SURGERY      REVIEW OF SYSTEMS:   Review of Systems  Constitutional: Negative for appetite change, chills, fatigue, fever and unexpected weight change.  HENT:   Negative for mouth sores, nosebleeds, sore throat and trouble swallowing.   Eyes: Negative for eye problems and icterus.  Respiratory: Negative for cough, hemoptysis, shortness of breath and wheezing.   Cardiovascular: Negative for chest pain and leg swelling.  Gastrointestinal: Negative for abdominal pain, constipation, diarrhea, nausea and vomiting.  Genitourinary: Negative for bladder incontinence, difficulty urinating, dysuria, frequency and hematuria.   Musculoskeletal: Negative for back pain, gait problem, neck pain and neck stiffness.  Skin: Negative for itching and rash.  Neurological: Negative for dizziness, extremity weakness, gait problem, headaches, light-headedness and seizures.  Hematological: Negative for adenopathy. Does not bruise/bleed easily.  Psychiatric/Behavioral: Negative for confusion, depression and sleep disturbance. The patient is not nervous/anxious.     PHYSICAL EXAMINATION:  There were no vitals taken for this visit.  ECOG PERFORMANCE STATUS: {CHL ONC ECOG [id number]  Physical Exam  Constitutional: Oriented to person, place, and time and well-developed, well-nourished, and in no distress. No distress.  HENT:  Head: Normocephalic and atraumatic.  Mouth/Throat: Oropharynx is clear and moist. No oropharyngeal exudate.  Eyes: Conjunctivae are normal. Right eye exhibits no discharge. Left eye exhibits no discharge. No scleral icterus.  Neck: Normal range of motion. Neck supple.  Cardiovascular: Normal rate, regular rhythm, normal heart sounds and intact distal pulses.   Pulmonary/Chest: Effort  normal and breath sounds normal. No respiratory distress. No wheezes. No rales.  Abdominal: Soft. Bowel sounds are normal. Exhibits no distension and no mass. There is no tenderness.  Musculoskeletal: Normal range of motion. Exhibits no edema.  Lymphadenopathy:    No cervical adenopathy.  Neurological: Alert and oriented to person, place, and time. Exhibits normal muscle tone. Gait normal. Coordination normal.  Skin: Skin is warm and dry. No rash noted. Not diaphoretic. No erythema. No pallor.  Psychiatric: Mood, memory and judgment normal.  Vitals reviewed.  LABORATORY DATA: Lab Results  Component Value Date   WBC 4.4 04/13/2023   HGB 7.5 (L) 04/13/2023   HCT 22.1 (L) 04/13/2023   MCV 90.6 04/13/2023   PLT 125 (L) 04/13/2023      Chemistry      Component Value Date/Time   NA 135 04/13/2023 0810   K 3.9 04/13/2023 0810   CL 97 (L) 04/13/2023 0810   CO2 25 04/13/2023 0810   BUN 56 (H) 04/13/2023 0810   CREATININE 6.71 (H) 04/13/2023 0810      Component Value Date/Time   CALCIUM 8.2 (L) 04/13/2023 0810   ALKPHOS 85 04/13/2023 0810   AST  17 04/13/2023 0810   ALT 8 04/13/2023 0810   BILITOT 0.4 04/13/2023 0810       RADIOGRAPHIC STUDIES:  No results found.   ASSESSMENT/PLAN:  This is a very pleasant 58 year old Caucasian male diagnosed with multiple myeloma with 53% plasma cells on bone marrow biopsy and aspirate.   The patient is here to establish care on an outpatient basis.  He was recently hospitalized in August 2020 for which showed his diagnosis of multiple myeloma.  While admitted to the hospital he underwent day 1 cycle 1.   He rstarted treatment with subcutaneous Velcade 1.3 Mg/KG on days 1, 4, 8 and 11 every 3 weeks, cyclophosphamide 500 Mg/M2 IV on days 1 and 8 as well as Decadron 40 mg p.o. on weekly basis start with the first dose of Velcade on 11/14/22.  In November 2024, daratumumab was added.     His hemoglobin is *** today.  His creatinine is ***.  He  is okay to proceed with day 1 cycle 4 today scheduled.  We will see him back for labs and follow up in 2 weeks as scheduled.   The patient was advised to call immediately if he has any concerning symptoms in the interval. The patient voices understanding of current disease status and treatment options and is in agreement with the current care plan. All questions were answered. The patient knows to call the clinic with any problems, questions or concerns. We can certainly see the patient much sooner if necessary    No orders of the defined types were placed in this encounter.    I spent {CHL ONC TIME VISIT - NWGNF:[id number]} counseling the patient face to face. The total time spent in the appointment was {CHL ONC TIME VISIT - IONGE:[id number]}.  Smiley Birr L Denya Buckingham, PA-C 04/24/23

## 2023-04-26 ENCOUNTER — Encounter: Payer: Self-pay | Admitting: Internal Medicine

## 2023-04-26 ENCOUNTER — Other Ambulatory Visit: Payer: Self-pay

## 2023-04-27 ENCOUNTER — Other Ambulatory Visit: Payer: Self-pay | Admitting: Physician Assistant

## 2023-04-27 ENCOUNTER — Inpatient Hospital Stay: Payer: Self-pay

## 2023-04-27 ENCOUNTER — Encounter: Payer: Self-pay | Admitting: Internal Medicine

## 2023-04-27 ENCOUNTER — Inpatient Hospital Stay (HOSPITAL_BASED_OUTPATIENT_CLINIC_OR_DEPARTMENT_OTHER): Payer: Self-pay | Admitting: Physician Assistant

## 2023-04-27 VITALS — BP 129/68 | HR 84 | Temp 98.0°F | Resp 15 | Wt 183.6 lb

## 2023-04-27 DIAGNOSIS — D649 Anemia, unspecified: Secondary | ICD-10-CM

## 2023-04-27 DIAGNOSIS — C9000 Multiple myeloma not having achieved remission: Secondary | ICD-10-CM

## 2023-04-27 DIAGNOSIS — Z5112 Encounter for antineoplastic immunotherapy: Secondary | ICD-10-CM | POA: Diagnosis not present

## 2023-04-27 LAB — CBC WITH DIFFERENTIAL (CANCER CENTER ONLY)
Abs Immature Granulocytes: 0.01 10*3/uL (ref 0.00–0.07)
Basophils Absolute: 0.1 10*3/uL (ref 0.0–0.1)
Basophils Relative: 3 %
Eosinophils Absolute: 0.1 10*3/uL (ref 0.0–0.5)
Eosinophils Relative: 4 %
HCT: 21.8 % — ABNORMAL LOW (ref 39.0–52.0)
Hemoglobin: 7.6 g/dL — ABNORMAL LOW (ref 13.0–17.0)
Immature Granulocytes: 0 %
Lymphocytes Relative: 12 %
Lymphs Abs: 0.4 10*3/uL — ABNORMAL LOW (ref 0.7–4.0)
MCH: 31.4 pg (ref 26.0–34.0)
MCHC: 34.9 g/dL (ref 30.0–36.0)
MCV: 90.1 fL (ref 80.0–100.0)
Monocytes Absolute: 0.4 10*3/uL (ref 0.1–1.0)
Monocytes Relative: 14 %
Neutro Abs: 2 10*3/uL (ref 1.7–7.7)
Neutrophils Relative %: 67 %
Platelet Count: 161 10*3/uL (ref 150–400)
RBC: 2.42 MIL/uL — ABNORMAL LOW (ref 4.22–5.81)
RDW: 15.6 % — ABNORMAL HIGH (ref 11.5–15.5)
WBC Count: 3.1 10*3/uL — ABNORMAL LOW (ref 4.0–10.5)
nRBC: 0 % (ref 0.0–0.2)

## 2023-04-27 LAB — PREPARE RBC (CROSSMATCH)

## 2023-04-27 LAB — CMP (CANCER CENTER ONLY)
ALT: 10 U/L (ref 0–44)
AST: 18 U/L (ref 15–41)
Albumin: 3.9 g/dL (ref 3.5–5.0)
Alkaline Phosphatase: 76 U/L (ref 38–126)
Anion gap: 11 (ref 5–15)
BUN: 48 mg/dL — ABNORMAL HIGH (ref 6–20)
CO2: 26 mmol/L (ref 22–32)
Calcium: 8.7 mg/dL — ABNORMAL LOW (ref 8.9–10.3)
Chloride: 97 mmol/L — ABNORMAL LOW (ref 98–111)
Creatinine: 7.25 mg/dL (ref 0.61–1.24)
GFR, Estimated: 8 mL/min — ABNORMAL LOW (ref >60)
Glucose, Bld: 94 mg/dL (ref 70–99)
Potassium: 4 mmol/L (ref 3.5–5.1)
Sodium: 134 mmol/L — ABNORMAL LOW (ref 135–145)
Total Bilirubin: 0.3 mg/dL (ref 0.0–1.2)
Total Protein: 6.2 g/dL — ABNORMAL LOW (ref 6.5–8.1)

## 2023-04-27 LAB — SAMPLE TO BLOOD BANK

## 2023-04-27 MED ORDER — DARATUMUMAB-HYALURONIDASE-FIHJ 1800-30000 MG-UT/15ML ~~LOC~~ SOLN
1800.0000 mg | Freq: Once | SUBCUTANEOUS | Status: AC
  Administered 2023-04-27: 1800 mg via SUBCUTANEOUS
  Filled 2023-04-27: qty 15

## 2023-04-27 MED ORDER — PALONOSETRON HCL INJECTION 0.25 MG/5ML
0.2500 mg | Freq: Once | INTRAVENOUS | Status: AC
  Administered 2023-04-27: 0.25 mg via INTRAVENOUS
  Filled 2023-04-27: qty 5

## 2023-04-27 MED ORDER — DEXAMETHASONE 4 MG PO TABS
40.0000 mg | ORAL_TABLET | Freq: Once | ORAL | Status: AC
  Administered 2023-04-27: 40 mg via ORAL
  Filled 2023-04-27: qty 10

## 2023-04-27 MED ORDER — ACYCLOVIR 200 MG PO CAPS: 200.0000 mg | ORAL_CAPSULE | Freq: Two times a day (BID) | ORAL | 2 refills | Status: DC

## 2023-04-27 MED ORDER — ACETAMINOPHEN 325 MG PO TABS
650.0000 mg | ORAL_TABLET | Freq: Once | ORAL | Status: AC
  Administered 2023-04-27: 650 mg via ORAL
  Filled 2023-04-27: qty 2

## 2023-04-27 MED ORDER — SODIUM CHLORIDE 0.9 % IV SOLN
500.0000 mg/m2 | Freq: Once | INTRAVENOUS | Status: AC
  Administered 2023-04-27: 1000 mg via INTRAVENOUS
  Filled 2023-04-27: qty 50

## 2023-04-27 MED ORDER — DIPHENHYDRAMINE HCL 25 MG PO CAPS
50.0000 mg | ORAL_CAPSULE | Freq: Once | ORAL | Status: AC
  Administered 2023-04-27: 50 mg via ORAL
  Filled 2023-04-27: qty 2

## 2023-04-27 MED ORDER — BORTEZOMIB CHEMO SQ INJECTION 3.5 MG (2.5MG/ML)
1.5000 mg/m2 | Freq: Once | INTRAMUSCULAR | Status: AC
  Administered 2023-04-27: 3 mg via SUBCUTANEOUS
  Filled 2023-04-27: qty 1.2

## 2023-04-27 NOTE — Patient Instructions (Signed)
[facility]  Discharge Instructions: Thank you for choosing [facility] to provide your oncology and hematology care.   If you have a lab appointment with the Cancer Center, please go directly to the Cancer Center and check in at the registration area.   Wear comfortable clothing and clothing appropriate for easy access to any Portacath or PICC line.   We strive to give you quality time with your provider. You may need to reschedule your appointment if you arrive late (15 or more minutes).  Arriving late affects you and other patients whose appointments are after yours.  Also, if you miss three or more appointments without notifying the office, you may be dismissed from the clinic at the provider's discretion.      For prescription refill requests, have your pharmacy contact our office and allow 72 hours for refills to be completed.    Today you received the following chemotherapy and/or immunotherapy agents: Cytoxan, Velcade, Dara Faspro.       To help prevent nausea and vomiting after your treatment, we encourage you to take your nausea medication as directed.  BELOW ARE SYMPTOMS THAT SHOULD BE REPORTED IMMEDIATELY: *FEVER GREATER THAN 100.4 F (38 C) OR HIGHER *CHILLS OR SWEATING *NAUSEA AND VOMITING THAT IS NOT CONTROLLED WITH YOUR NAUSEA MEDICATION *UNUSUAL SHORTNESS OF BREATH *UNUSUAL BRUISING OR BLEEDING *URINARY PROBLEMS (pain or burning when urinating, or frequent urination) *BOWEL PROBLEMS (unusual diarrhea, constipation, pain near the anus) TENDERNESS IN MOUTH AND THROAT WITH OR WITHOUT PRESENCE OF ULCERS (sore throat, sores in mouth, or a toothache) UNUSUAL RASH, SWELLING OR PAIN  UNUSUAL VAGINAL DISCHARGE OR ITCHING   Items with * indicate a potential emergency and should be followed up as soon as possible or go to the Emergency Department if any problems should occur.  Please show the CHEMOTHERAPY ALERT  CARD or IMMUNOTHERAPY ALERT CARD at check-in to the Emergency Department and triage nurse.  Should you have questions after your visit or need to cancel or reschedule your appointment, please contact [facility]  Dept: [phone]  and follow the prompts.  Office hours are 8:00 a.m. to 4:30 p.m. Monday - Friday. Please note that voicemails left after 4:00 p.m. may not be returned until the following business day.  We are closed weekends and major holidays. You have access to a nurse at all times for urgent questions. Please call the main number to the clinic Dept: [phone] and follow the prompts.   For any non-urgent questions, you may also contact your provider using MyChart. We now offer e-Visits for anyone 19 and older to request care online for non-urgent symptoms. For details visit [email or web address].   Also download the MyChart app! Go to the app store, search "MyChart", open the app, select [place], and log in with your MyChart username and password.

## 2023-04-28 LAB — KAPPA/LAMBDA LIGHT CHAINS
Kappa free light chain: 40.1 mg/L — ABNORMAL HIGH (ref 3.3–19.4)
Kappa, lambda light chain ratio: 1.34 (ref 0.26–1.65)
Lambda free light chains: 30 mg/L — ABNORMAL HIGH (ref 5.7–26.3)

## 2023-04-29 ENCOUNTER — Inpatient Hospital Stay: Payer: Medicaid Other

## 2023-04-29 ENCOUNTER — Other Ambulatory Visit: Payer: Self-pay | Admitting: Internal Medicine

## 2023-04-29 DIAGNOSIS — D649 Anemia, unspecified: Secondary | ICD-10-CM

## 2023-04-29 DIAGNOSIS — C9000 Multiple myeloma not having achieved remission: Secondary | ICD-10-CM

## 2023-04-29 DIAGNOSIS — Z5112 Encounter for antineoplastic immunotherapy: Secondary | ICD-10-CM | POA: Diagnosis not present

## 2023-04-29 LAB — BETA 2 MICROGLOBULIN, SERUM: Beta-2 Microglobulin: 9.5 mg/L — ABNORMAL HIGH (ref 0.6–2.4)

## 2023-04-29 MED ORDER — DIPHENHYDRAMINE HCL 25 MG PO CAPS
25.0000 mg | ORAL_CAPSULE | Freq: Once | ORAL | Status: AC
  Administered 2023-04-29: 25 mg via ORAL
  Filled 2023-04-29: qty 1

## 2023-04-29 MED ORDER — ACETAMINOPHEN 325 MG PO TABS
650.0000 mg | ORAL_TABLET | Freq: Once | ORAL | Status: AC
  Administered 2023-04-29: 650 mg via ORAL
  Filled 2023-04-29: qty 2

## 2023-04-29 MED ORDER — SODIUM CHLORIDE 0.9% IV SOLUTION
250.0000 mL | INTRAVENOUS | Status: DC
  Administered 2023-04-29: 250 mL via INTRAVENOUS

## 2023-04-29 NOTE — Progress Notes (Signed)
Patient presents today for Transfusion of 1 unit PRBC. Hgb 7.6.  Patient is in satisfactory condition with no new complaints voiced.  Vital signs are stable.  We will proceed with transfusion per provider orders.    Patient tolerated transfusion well with no complaints voiced.  Patient left ambulatory in stable condition.  Vital signs stable at discharge.  Follow up as scheduled.

## 2023-04-29 NOTE — Patient Instructions (Signed)
[facility]  Discharge Instructions: Thank you for choosing [facility] to provide your oncology and hematology care.   If you have a lab appointment with the Cancer Center, please go directly to the Cancer Center and check in at the registration area.   Wear comfortable clothing and clothing appropriate for easy access to any Portacath or PICC line.   We strive to give you quality time with your provider. You may need to reschedule your appointment if you arrive late (15 or more minutes).  Arriving late affects you and other patients whose appointments are after yours.  Also, if you miss three or more appointments without notifying the office, you may be dismissed from the clinic at the provider's discretion.      For prescription refill requests, have your pharmacy contact our office and allow 72 hours for refills to be completed.    Today you received the following blood transfusion x1 unit PRBC.  Blood Transfusion, Adult A blood transfusion is a procedure in which you receive blood or a type of blood cell (blood component) through an IV. You may need a blood transfusion when you have a low blood count, which is a low number of any blood cell. This may result from a bleeding disorder, illness, injury, or surgery. The blood may come from a donor, or you may be able to have your own blood collected and stored (autologous blood donation) before a planned surgery. The blood given in a transfusion may be made up of different blood components. You may receive: Red blood cells. These carry oxygen to the cells in the body. Platelets. These help your blood to clot. Plasma. This is the liquid part of your blood. It carries proteins and other substances throughout the body. White blood cells. These help you fight infections. If you have hemophilia or another clotting disorder, you may also receive other types of blood  products. Depending on the type of blood product, this procedure may take 1-4 hours to complete. Tell a health care provider about: Any bleeding problems you have. Any previous reactions you have had during a blood transfusion. Any allergies you have. All medicines you are taking, including vitamins, herbs, eye drops, creams, and over-the-counter medicines. Any surgeries you have had. Any medical conditions you have. Whether you are pregnant or may be pregnant. What are the risks? Talk with your health care provider about risks. The most common problems include: A mild allergic reaction, such as red, swollen areas of skin (hives) and itching. Fever or chills. This may be the body's response to new blood cells received. This may occur during or up to 4 hours after the transfusion. More serious problems may include: A serious allergic reaction that causes difficulty breathing or swelling around the face and lips. Transfusion-associated circulatory overload (TACO), or too much fluid in the lungs. This may cause breathing problems. Transfusion-related acute lung injury (TRALI), which causes breathing difficulty and low oxygen in the blood. This can occur within hours of the transfusion or several days later. Iron overload. This can happen after receiving many blood transfusions over a period of time. Infection or virus being transmitted. This is rare because donated blood is carefully tested before it is given. Hemolytic transfusion reaction. This is rare. It happens when the body's defense system (immune system)tries to attack the new blood cells. Symptoms may include fever, chills, nausea, low blood pressure, and low  back or chest pain. Transfusion-associated graft-versus-host disease (TAGVHD). This is rare. It happens when donated cells attack the body's healthy tissues. What happens before the procedure? You will have a blood test to check your blood type. This test is done to know what kind  of blood your body will accept and to match it to the donor blood. If you are going to have a planned surgery, you may be able to do an autologous blood donation. This may be done in case you need to have a transfusion. You will have your temperature, blood pressure, and pulse checked before the transfusion. If you have had an allergic reaction to a transfusion in the past, you may be given medicine to help prevent a reaction. This medicine may be given to you by mouth (orally) or through an IV. What happens during the procedure?  An IV will be inserted into one of your veins. The bag of blood will be attached to your IV. The blood will then enter through your vein. Your temperature, blood pressure, and pulse will be monitored during the transfusion. This monitoring is done to detect early signs of a transfusion reaction. Tell your nurse right away if you have any of these symptoms during the transfusion: Shortness of breath or trouble breathing. Chest or back pain. Fever or chills. Itching or hives. If you have any signs or symptoms of a reaction, your transfusion will be stopped and you may be given medicine. When the transfusion is complete, your IV will be removed. Pressure may be applied to the IV site for a few minutes. A bandage (dressing)will be applied. The procedure may vary among health care providers and hospitals. What happens after the procedure? Your temperature, blood pressure, pulse, breathing rate, and blood oxygen level will be monitored until you leave the hospital or clinic. Your blood may be tested to see how you have responded to the transfusion. You may be warmed with fluids or blankets to maintain a normal body temperature. If you receive your blood transfusion in an outpatient setting, you will be told whom to contact to report any reactions. Where to find more information Visit the [employer]: redcross.org Summary A blood transfusion is a procedure in  which you receive blood or a type of blood cell (blood component) through an IV. The blood given in a transfusion may be made up of different blood components. You may receive red blood cells, platelets, plasma, or white blood cells depending on the condition treated. Your temperature, blood pressure, and pulse will be monitored before, during, and after the transfusion. After the transfusion, your blood may be tested to see how your body has responded. This information is not intended to replace advice given to you by your health care provider. Make sure you discuss any questions you have with your health care provider. Document Revised: 06/14/2021 Document Reviewed: 06/14/2021 Elsevier Patient Education  2024 Elsevier Inc.   To help prevent nausea and vomiting after your treatment, we encourage you to take your nausea medication as directed.  BELOW ARE SYMPTOMS THAT SHOULD BE REPORTED IMMEDIATELY: *FEVER GREATER THAN 100.4 F (38 C) OR HIGHER *CHILLS OR SWEATING *NAUSEA AND VOMITING THAT IS NOT CONTROLLED WITH YOUR NAUSEA MEDICATION *UNUSUAL SHORTNESS OF BREATH *UNUSUAL BRUISING OR BLEEDING *URINARY PROBLEMS (pain or burning when urinating, or frequent urination) *BOWEL PROBLEMS (unusual diarrhea, constipation, pain near the anus) TENDERNESS IN MOUTH AND THROAT WITH OR WITHOUT PRESENCE OF ULCERS (sore throat, sores in mouth, or a toothache)  UNUSUAL RASH, SWELLING OR PAIN  UNUSUAL VAGINAL DISCHARGE OR ITCHING   Items with * indicate a potential emergency and should be followed up as soon as possible or go to the Emergency Department if any problems should occur.  Please show the CHEMOTHERAPY ALERT CARD or IMMUNOTHERAPY ALERT CARD at check-in to the Emergency Department and triage nurse.  Should you have questions after your visit or need to cancel or reschedule your appointment, please contact [facility]  Dept: [phone]  and  follow the prompts.  Office hours are 8:00 a.m. to 4:30 p.m. Monday - Friday. Please note that voicemails left after 4:00 p.m. may not be returned until the following business day.  We are closed weekends and major holidays. You have access to a nurse at all times for urgent questions. Please call the main number to the clinic Dept: [phone] and follow the prompts.   For any non-urgent questions, you may also contact your provider using MyChart. We now offer e-Visits for anyone 60 and older to request care online for non-urgent symptoms. For details visit [email or web address].   Also download the MyChart app! Go to the app store, search "MyChart", open the app, select [place], and log in with your MyChart username and password.

## 2023-04-30 LAB — MULTIPLE MYELOMA PANEL, SERUM
Albumin SerPl Elph-Mcnc: 3.4 g/dL (ref 2.9–4.4)
Albumin/Glob SerPl: 1.5 (ref 0.7–1.7)
Alpha 1: 0.3 g/dL (ref 0.0–0.4)
Alpha2 Glob SerPl Elph-Mcnc: 0.8 g/dL (ref 0.4–1.0)
B-Globulin SerPl Elph-Mcnc: 1 g/dL (ref 0.7–1.3)
Gamma Glob SerPl Elph-Mcnc: 0.3 g/dL — ABNORMAL LOW (ref 0.4–1.8)
Globulin, Total: 2.4 g/dL (ref 2.2–3.9)
IgA: 57 mg/dL — ABNORMAL LOW (ref 90–386)
IgG (Immunoglobin G), Serum: 393 mg/dL — ABNORMAL LOW (ref 603–1613)
IgM (Immunoglobulin M), Srm: 20 mg/dL (ref 20–172)
M Protein SerPl Elph-Mcnc: 0.1 g/dL — ABNORMAL HIGH
Total Protein ELP: 5.8 g/dL — ABNORMAL LOW (ref 6.0–8.5)

## 2023-04-30 LAB — BPAM RBC
Blood Product Expiration Date: 202502282359
ISSUE DATE / TIME: 202501291438
Unit Type and Rh: 202502282359
Unit Type and Rh: 5100

## 2023-04-30 LAB — TYPE AND SCREEN
ABO/RH(D): AB POS
Antibody Screen: POSITIVE
Donor AG Type: NEGATIVE
Unit division: 0

## 2023-05-04 ENCOUNTER — Ambulatory Visit (INDEPENDENT_AMBULATORY_CARE_PROVIDER_SITE_OTHER): Payer: Medicaid Other | Admitting: Physician Assistant

## 2023-05-04 ENCOUNTER — Other Ambulatory Visit: Payer: Self-pay | Admitting: Hematology and Oncology

## 2023-05-04 ENCOUNTER — Encounter: Payer: Self-pay | Admitting: Internal Medicine

## 2023-05-04 ENCOUNTER — Inpatient Hospital Stay: Payer: Medicaid Other

## 2023-05-04 ENCOUNTER — Ambulatory Visit (INDEPENDENT_AMBULATORY_CARE_PROVIDER_SITE_OTHER)
Admission: RE | Admit: 2023-05-04 | Discharge: 2023-05-04 | Disposition: A | Discharge status: Home / Self Care | Payer: Medicaid Other | Source: Ambulatory Visit | Attending: Surgery | Admitting: Surgery

## 2023-05-04 ENCOUNTER — Inpatient Hospital Stay: Payer: Medicaid Other | Attending: Internal Medicine

## 2023-05-04 VITALS — BP 120/64 | HR 79 | Temp 97.9°F | Resp 18 | Ht 70.0 in | Wt 183.6 lb

## 2023-05-04 VITALS — BP 120/70 | HR 74 | Temp 98.1°F | Resp 18 | Wt 182.8 lb

## 2023-05-04 DIAGNOSIS — D649 Anemia, unspecified: Secondary | ICD-10-CM | POA: Insufficient documentation

## 2023-05-04 DIAGNOSIS — N186 End stage renal disease: Secondary | ICD-10-CM | POA: Insufficient documentation

## 2023-05-04 DIAGNOSIS — C9000 Multiple myeloma not having achieved remission: Secondary | ICD-10-CM | POA: Diagnosis present

## 2023-05-04 DIAGNOSIS — Z992 Dependence on renal dialysis: Secondary | ICD-10-CM | POA: Insufficient documentation

## 2023-05-04 DIAGNOSIS — G629 Polyneuropathy, unspecified: Secondary | ICD-10-CM | POA: Insufficient documentation

## 2023-05-04 DIAGNOSIS — Z5111 Encounter for antineoplastic chemotherapy: Secondary | ICD-10-CM | POA: Insufficient documentation

## 2023-05-04 DIAGNOSIS — Z5112 Encounter for antineoplastic immunotherapy: Secondary | ICD-10-CM | POA: Diagnosis present

## 2023-05-04 DIAGNOSIS — Z79899 Other long term (current) drug therapy: Secondary | ICD-10-CM | POA: Insufficient documentation

## 2023-05-04 LAB — CMP (CANCER CENTER ONLY)
ALT: 8 U/L (ref 0–44)
AST: 15 U/L (ref 15–41)
Albumin: 3.9 g/dL (ref 3.5–5.0)
Alkaline Phosphatase: 80 U/L (ref 38–126)
Anion gap: 13 (ref 5–15)
BUN: 52 mg/dL — ABNORMAL HIGH (ref 6–20)
CO2: 25 mmol/L (ref 22–32)
Calcium: 8.1 mg/dL — ABNORMAL LOW (ref 8.9–10.3)
Chloride: 98 mmol/L (ref 98–111)
Creatinine: 6.95 mg/dL — ABNORMAL HIGH (ref 0.61–1.24)
GFR, Estimated: 8 mL/min — ABNORMAL LOW (ref >60)
Glucose, Bld: 94 mg/dL (ref 70–99)
Potassium: 3.8 mmol/L (ref 3.5–5.1)
Sodium: 136 mmol/L (ref 135–145)
Total Bilirubin: 0.4 mg/dL (ref 0.0–1.2)
Total Protein: 5.9 g/dL — ABNORMAL LOW (ref 6.5–8.1)

## 2023-05-04 LAB — CBC WITH DIFFERENTIAL (CANCER CENTER ONLY)
Abs Immature Granulocytes: 0.02 10*3/uL (ref 0.00–0.07)
Basophils Absolute: 0.1 10*3/uL (ref 0.0–0.1)
Basophils Relative: 2 %
Eosinophils Absolute: 0.1 10*3/uL (ref 0.0–0.5)
Eosinophils Relative: 4 %
HCT: 24.8 % — ABNORMAL LOW (ref 39.0–52.0)
Hemoglobin: 8.1 g/dL — ABNORMAL LOW (ref 13.0–17.0)
Immature Granulocytes: 1 %
Lymphocytes Relative: 10 %
Lymphs Abs: 0.4 10*3/uL — ABNORMAL LOW (ref 0.7–4.0)
MCH: 30.1 pg (ref 26.0–34.0)
MCHC: 32.7 g/dL (ref 30.0–36.0)
MCV: 92.2 fL (ref 80.0–100.0)
Monocytes Absolute: 0.3 10*3/uL (ref 0.1–1.0)
Monocytes Relative: 9 %
Neutro Abs: 2.8 10*3/uL (ref 1.7–7.7)
Neutrophils Relative %: 74 %
Platelet Count: 127 10*3/uL — ABNORMAL LOW (ref 150–400)
RBC: 2.69 MIL/uL — ABNORMAL LOW (ref 4.22–5.81)
RDW: 17.1 % — ABNORMAL HIGH (ref 11.5–15.5)
WBC Count: 3.7 10*3/uL — ABNORMAL LOW (ref 4.0–10.5)
nRBC: 0 % (ref 0.0–0.2)

## 2023-05-04 LAB — SAMPLE TO BLOOD BANK

## 2023-05-04 MED ORDER — DEXAMETHASONE 4 MG PO TABS
40.0000 mg | ORAL_TABLET | Freq: Once | ORAL | Status: AC
  Administered 2023-05-04: 40 mg via ORAL
  Filled 2023-05-04: qty 10

## 2023-05-04 MED ORDER — BORTEZOMIB CHEMO SQ INJECTION 3.5 MG (2.5MG/ML)
1.5000 mg/m2 | Freq: Once | INTRAMUSCULAR | Status: AC
  Administered 2023-05-04: 3 mg via SUBCUTANEOUS
  Filled 2023-05-04: qty 1.2

## 2023-05-04 MED ORDER — SODIUM CHLORIDE 0.9 % IV SOLN: Freq: Once | INTRAVENOUS | Status: AC

## 2023-05-04 MED ORDER — PALONOSETRON HCL INJECTION 0.25 MG/5ML
0.2500 mg | Freq: Once | INTRAVENOUS | Status: AC
  Administered 2023-05-04: 0.25 mg via INTRAVENOUS
  Filled 2023-05-04: qty 5

## 2023-05-04 MED ORDER — SODIUM CHLORIDE 0.9 % IV SOLN
500.0000 mg/m2 | Freq: Once | INTRAVENOUS | Status: AC
  Administered 2023-05-04: 1000 mg via INTRAVENOUS
  Filled 2023-05-04: qty 50

## 2023-05-04 NOTE — Progress Notes (Signed)
POST OPERATIVE OFFICE NOTE    CC:  F/u for surgery  HPI:  This is a 58 y.o. male who is s/p left BC AV fistula creation by Dr. Myra Gianotti on 03/26/23 Pt returns today for follow up.  Pt states he has no pain, weakness or loss of motor or sensation.  He has a working [facility] that was placed by IR at [employer].  He is on HD TTS and is followed by Dr. Allena Katz.     No Known Allergies  Current Outpatient Medications  Medication Sig Dispense Refill   acetaminophen (TYLENOL) 500 MG tablet Take 500 mg by mouth every 6 (six) hours as needed for moderate pain.     acyclovir (ZOVIRAX) 200 MG capsule Take 1 capsule (200 mg total) by mouth 2 (two) times daily. 60 capsule 2   Calcium Carbonate (CALCIUM 600 PO) Take 600 mg by mouth 2 (two) times daily.     DARZALEX FASPRO 1800-30000 MG-UT/15ML SOLN Inject into the skin.     dexamethasone (DECADRON) 4 MG tablet Take 10 tablets by mouth once a week, skip 9/6 and take it 9/9 instead with treatment (Patient taking differently: Take 10 tablets by mouth once a week, skip 9/6 and take it 9/9 instead with treatment. PT STATES HE TOOK ON 12/05/22) 40 tablet 4   ferrous sulfate 325 (65 FE) MG tablet Take 325 mg by mouth daily with breakfast.     furosemide (LASIX) 40 MG tablet Take 40 mg by mouth every other day.     gabapentin (NEURONTIN) 100 MG capsule `T1C BY MOUTH THREE TIMES DAILY 90 capsule 1   prochlorperazine (COMPAZINE) 10 MG tablet Take 1 tablet (10 mg total) by mouth every 6 (six) hours as needed. 30 tablet 2   sodium bicarbonate 650 MG tablet Take 1 tablet (650 mg total) by mouth 2 (two) times daily. 60 tablet 0   ferric citrate (AURYXIA) 1 GM 210 MG(Fe) tablet Take 2 tablets (420 mg total) by mouth 3 (three) times daily with meals. (Patient not taking: Reported on 05/04/2023) 180 tablet 0   oxyCODONE (OXY IR/ROXICODONE) 5 MG immediate release tablet Take 1 tablet (5 mg total) by mouth every 6 (six) hours as needed for moderate pain (pain score 4-6) (for pain score of  1-4). (Patient not taking: Reported on 05/04/2023) 12 tablet 0   No current facility-administered medications for this visit.     ROS:  See HPI  Physical Exam:       Findings:  +--------------------+----------+-----------------+--------+  AVF                PSV (cm/s)Flow Vol (mL/min)Comments  +--------------------+----------+-----------------+--------+  Native artery inflow   214          1644                 +--------------------+----------+-----------------+--------+  AVF Anastomosis        499                               +--------------------+----------+-----------------+--------+     +------------+----------+-------------+----------+--------+  OUTFLOW VEINPSV (cm/s)Diameter (cm)Depth (cm)Describe  +------------+----------+-------------+----------+--------+  Shoulder      158        0.60        0.40             +------------+----------+-------------+----------+--------+  Prox UA        101        0.75  0.30             +------------+----------+-------------+----------+--------+  Mid UA         154        0.70        0.20             +------------+----------+-------------+----------+--------+  Dist UA        100        0.80        0.10             +------------+----------+-------------+----------+--------+  AC Fossa       540        0.30        0.40             +------------+----------+-------------+----------+--------+     Summary:  - Patent left arm brachial-cephalic arteriovenous fistula.  - Flow volume: 1644 mL/min.   Incision:  well healed Extremities:  grip, motor and sensation intact and equal B UE Easily palpable thrill in the fistula as well as radial pulse.      Assessment/Plan:  This is a 58 y.o. male who is s/p: left UE BC AV fistula creation by Dr. Myra Gianotti  The fistula was created 03/26/23 so it may be accessed by 06/24/23.  Once it is working well he can have the [facility] removed. If he has issues or  concerns he will call our office.   Mosetta Pigeon PA-C [facility] [phone]   Clinic MD:  Myra Gianotti

## 2023-05-04 NOTE — Patient Instructions (Signed)
 [facility]  Discharge Instructions: Thank you for choosing [facility] to provide your oncology and hematology care.   If you have a lab appointment with the Cancer Center, please go directly to the Cancer Center and check in at the registration area.   Wear comfortable clothing and clothing appropriate for easy access to any Portacath or PICC line.   We strive to give you quality time with your provider. You may need to reschedule your appointment if you arrive late (15 or more minutes).  Arriving late affects you and other patients whose appointments are after yours.  Also, if you miss three or more appointments without notifying the office, you may be dismissed from the clinic at the provider's discretion.      For prescription refill requests, have your pharmacy contact our office and allow 72 hours for refills to be completed.    Today you received the following chemotherapy and/or immunotherapy agents: Bortezomib (Velcade) and Cyclophosphamide (Cytoxan).      To help prevent nausea and vomiting after your treatment, we encourage you to take your nausea medication as directed.  BELOW ARE SYMPTOMS THAT SHOULD BE REPORTED IMMEDIATELY: *FEVER GREATER THAN 100.4 F (38 C) OR HIGHER *CHILLS OR SWEATING *NAUSEA AND VOMITING THAT IS NOT CONTROLLED WITH YOUR NAUSEA MEDICATION *UNUSUAL SHORTNESS OF BREATH *UNUSUAL BRUISING OR BLEEDING *URINARY PROBLEMS (pain or burning when urinating, or frequent urination) *BOWEL PROBLEMS (unusual diarrhea, constipation, pain near the anus) TENDERNESS IN MOUTH AND THROAT WITH OR WITHOUT PRESENCE OF ULCERS (sore throat, sores in mouth, or a toothache) UNUSUAL RASH, SWELLING OR PAIN  UNUSUAL VAGINAL DISCHARGE OR ITCHING   Items with * indicate a potential emergency and should be followed up as soon as possible or go to the Emergency Department if any problems should occur.  Please show  the CHEMOTHERAPY ALERT CARD or IMMUNOTHERAPY ALERT CARD at check-in to the Emergency Department and triage nurse.  Should you have questions after your visit or need to cancel or reschedule your appointment, please contact [facility]  Dept: [phone]  and follow the prompts.  Office hours are 8:00 a.m. to 4:30 p.m. Monday - Friday. Please note that voicemails left after 4:00 p.m. may not be returned until the following business day.  We are closed weekends and major holidays. You have access to a nurse at all times for urgent questions. Please call the main number to the clinic Dept: [phone] and follow the prompts.   For any non-urgent questions, you may also contact your provider using MyChart. We now offer e-Visits for anyone 61 and older to request care online for non-urgent symptoms. For details visit [email or web address].   Also download the MyChart app! Go to the app store, search "MyChart", open the app, select [place], and log in with your MyChart username and password.

## 2023-05-04 NOTE — Progress Notes (Signed)
Per Dr. Arbutus Ped, ok for treatment today with serum creatine 6.95

## 2023-05-06 ENCOUNTER — Other Ambulatory Visit: Payer: Self-pay

## 2023-05-11 ENCOUNTER — Other Ambulatory Visit: Payer: Self-pay

## 2023-05-11 ENCOUNTER — Inpatient Hospital Stay: Payer: Medicaid Other

## 2023-05-11 ENCOUNTER — Ambulatory Visit: Payer: Self-pay | Admitting: Physician Assistant

## 2023-05-11 ENCOUNTER — Ambulatory Visit: Payer: Self-pay

## 2023-05-11 ENCOUNTER — Encounter: Payer: Self-pay | Admitting: Medical Oncology

## 2023-05-11 VITALS — BP 136/67 | HR 80 | Temp 97.8°F | Resp 18 | Wt 187.3 lb

## 2023-05-11 DIAGNOSIS — Z5112 Encounter for antineoplastic immunotherapy: Secondary | ICD-10-CM | POA: Diagnosis not present

## 2023-05-11 DIAGNOSIS — C9000 Multiple myeloma not having achieved remission: Secondary | ICD-10-CM

## 2023-05-11 LAB — CBC WITH DIFFERENTIAL (CANCER CENTER ONLY)
Abs Immature Granulocytes: 0.02 10*3/uL (ref 0.00–0.07)
Basophils Absolute: 0.1 10*3/uL (ref 0.0–0.1)
Basophils Relative: 2 %
Eosinophils Absolute: 0.1 10*3/uL (ref 0.0–0.5)
Eosinophils Relative: 2 %
HCT: 22.3 % — ABNORMAL LOW (ref 39.0–52.0)
Hemoglobin: 7.3 g/dL — ABNORMAL LOW (ref 13.0–17.0)
Immature Granulocytes: 0 %
Lymphocytes Relative: 5 %
Lymphs Abs: 0.2 10*3/uL — ABNORMAL LOW (ref 0.7–4.0)
MCH: 30.8 pg (ref 26.0–34.0)
MCHC: 32.7 g/dL (ref 30.0–36.0)
MCV: 94.1 fL (ref 80.0–100.0)
Monocytes Absolute: 0.4 10*3/uL (ref 0.1–1.0)
Monocytes Relative: 8 %
Neutro Abs: 3.7 10*3/uL (ref 1.7–7.7)
Neutrophils Relative %: 83 %
Platelet Count: 110 10*3/uL — ABNORMAL LOW (ref 150–400)
RBC: 2.37 MIL/uL — ABNORMAL LOW (ref 4.22–5.81)
RDW: 17.1 % — ABNORMAL HIGH (ref 11.5–15.5)
WBC Count: 4.5 10*3/uL (ref 4.0–10.5)
nRBC: 0 % (ref 0.0–0.2)

## 2023-05-11 LAB — CMP (CANCER CENTER ONLY)
ALT: 8 U/L (ref 0–44)
AST: 14 U/L — ABNORMAL LOW (ref 15–41)
Albumin: 3.7 g/dL (ref 3.5–5.0)
Alkaline Phosphatase: 78 U/L (ref 38–126)
Anion gap: 11 (ref 5–15)
BUN: 53 mg/dL — ABNORMAL HIGH (ref 6–20)
CO2: 28 mmol/L (ref 22–32)
Calcium: 8 mg/dL — ABNORMAL LOW (ref 8.9–10.3)
Chloride: 97 mmol/L — ABNORMAL LOW (ref 98–111)
Creatinine: 6.51 mg/dL — ABNORMAL HIGH (ref 0.61–1.24)
GFR, Estimated: 9 mL/min — ABNORMAL LOW (ref >60)
Glucose, Bld: 111 mg/dL — ABNORMAL HIGH (ref 70–99)
Potassium: 3.9 mmol/L (ref 3.5–5.1)
Sodium: 136 mmol/L (ref 135–145)
Total Bilirubin: 0.3 mg/dL (ref 0.0–1.2)
Total Protein: 5.7 g/dL — ABNORMAL LOW (ref 6.5–8.1)

## 2023-05-11 LAB — SAMPLE TO BLOOD BANK

## 2023-05-11 MED ORDER — BORTEZOMIB CHEMO SQ INJECTION 3.5 MG (2.5MG/ML)
1.5000 mg/m2 | Freq: Once | INTRAMUSCULAR | Status: AC
  Administered 2023-05-11: 3 mg via SUBCUTANEOUS
  Filled 2023-05-11: qty 1.2

## 2023-05-11 MED ORDER — ACETAMINOPHEN 325 MG PO TABS
650.0000 mg | ORAL_TABLET | Freq: Once | ORAL | Status: AC
  Administered 2023-05-11: 650 mg via ORAL
  Filled 2023-05-11: qty 2

## 2023-05-11 MED ORDER — SODIUM CHLORIDE 0.9 % IV SOLN: INTRAVENOUS | Status: DC

## 2023-05-11 MED ORDER — PALONOSETRON HCL INJECTION 0.25 MG/5ML
0.2500 mg | Freq: Once | INTRAVENOUS | Status: AC
  Administered 2023-05-11: 0.25 mg via INTRAVENOUS
  Filled 2023-05-11: qty 5

## 2023-05-11 MED ORDER — DIPHENHYDRAMINE HCL 25 MG PO CAPS
50.0000 mg | ORAL_CAPSULE | Freq: Once | ORAL | Status: AC
Start: 2023-05-11 — End: 2023-05-11
  Administered 2023-05-11: 50 mg via ORAL
  Filled 2023-05-11: qty 2

## 2023-05-11 MED ORDER — DARATUMUMAB-HYALURONIDASE-FIHJ 1800-30000 MG-UT/15ML ~~LOC~~ SOLN
1800.0000 mg | Freq: Once | SUBCUTANEOUS | Status: AC
  Administered 2023-05-11: 1800 mg via SUBCUTANEOUS
  Filled 2023-05-11: qty 15

## 2023-05-11 MED ORDER — DEXAMETHASONE 4 MG PO TABS
40.0000 mg | ORAL_TABLET | Freq: Once | ORAL | Status: AC
  Administered 2023-05-11: 40 mg via ORAL
  Filled 2023-05-11: qty 10

## 2023-05-11 MED ORDER — SODIUM CHLORIDE 0.9 % IV SOLN
500.0000 mg/m2 | Freq: Once | INTRAVENOUS | Status: AC
  Administered 2023-05-11: 1000 mg via INTRAVENOUS
  Filled 2023-05-11: qty 50

## 2023-05-11 NOTE — Progress Notes (Signed)
 Note entered in EPIC for  ok to treat. HGB 7.3 -Per Dr Marguerita Shih it is ok to treat pt today with Cytoxan ,Velcade  and Darzelex and HGB 7.3.

## 2023-05-11 NOTE — Patient Instructions (Signed)
 [facility]  Discharge Instructions: Thank you for choosing [facility] to provide your oncology and hematology care.   If you have a lab appointment with the Cancer Center, please go directly to the Cancer Center and check in at the registration area.   Wear comfortable clothing and clothing appropriate for easy access to any Portacath or PICC line.   We strive to give you quality time with your provider. You may need to reschedule your appointment if you arrive late (15 or more minutes).  Arriving late affects you and other patients whose appointments are after yours.  Also, if you miss three or more appointments without notifying the office, you may be dismissed from the clinic at the provider's discretion.      For prescription refill requests, have your pharmacy contact our office and allow 72 hours for refills to be completed.    Today you received the following chemotherapy and/or immunotherapy agents: Cytoxan, Velcade, Dara Faspro.       To help prevent nausea and vomiting after your treatment, we encourage you to take your nausea medication as directed.  BELOW ARE SYMPTOMS THAT SHOULD BE REPORTED IMMEDIATELY: *FEVER GREATER THAN 100.4 F (38 C) OR HIGHER *CHILLS OR SWEATING *NAUSEA AND VOMITING THAT IS NOT CONTROLLED WITH YOUR NAUSEA MEDICATION *UNUSUAL SHORTNESS OF BREATH *UNUSUAL BRUISING OR BLEEDING *URINARY PROBLEMS (pain or burning when urinating, or frequent urination) *BOWEL PROBLEMS (unusual diarrhea, constipation, pain near the anus) TENDERNESS IN MOUTH AND THROAT WITH OR WITHOUT PRESENCE OF ULCERS (sore throat, sores in mouth, or a toothache) UNUSUAL RASH, SWELLING OR PAIN  UNUSUAL VAGINAL DISCHARGE OR ITCHING   Items with * indicate a potential emergency and should be followed up as soon as possible or go to the Emergency Department if any problems should occur.  Please show the CHEMOTHERAPY ALERT  CARD or IMMUNOTHERAPY ALERT CARD at check-in to the Emergency Department and triage nurse.  Should you have questions after your visit or need to cancel or reschedule your appointment, please contact [facility]  Dept: [phone]  and follow the prompts.  Office hours are 8:00 a.m. to 4:30 p.m. Monday - Friday. Please note that voicemails left after 4:00 p.m. may not be returned until the following business day.  We are closed weekends and major holidays. You have access to a nurse at all times for urgent questions. Please call the main number to the clinic Dept: [phone] and follow the prompts.   For any non-urgent questions, you may also contact your provider using MyChart. We now offer e-Visits for anyone 19 and older to request care online for non-urgent symptoms. For details visit [email or web address].   Also download the MyChart app! Go to the app store, search "MyChart", open the app, select [place], and log in with your MyChart username and password.

## 2023-05-12 ENCOUNTER — Other Ambulatory Visit: Payer: Self-pay

## 2023-05-24 ENCOUNTER — Other Ambulatory Visit: Payer: Self-pay

## 2023-05-25 ENCOUNTER — Inpatient Hospital Stay: Payer: Medicaid Other

## 2023-05-25 ENCOUNTER — Inpatient Hospital Stay (HOSPITAL_BASED_OUTPATIENT_CLINIC_OR_DEPARTMENT_OTHER): Payer: Medicaid Other | Admitting: Internal Medicine

## 2023-05-25 ENCOUNTER — Other Ambulatory Visit: Payer: Self-pay | Admitting: Physician Assistant

## 2023-05-25 VITALS — BP 125/77 | HR 76 | Temp 97.8°F | Resp 16

## 2023-05-25 VITALS — BP 128/73 | HR 82 | Temp 98.4°F | Resp 17 | Ht 70.0 in | Wt 184.3 lb

## 2023-05-25 DIAGNOSIS — D631 Anemia in chronic kidney disease: Secondary | ICD-10-CM

## 2023-05-25 DIAGNOSIS — C9000 Multiple myeloma not having achieved remission: Secondary | ICD-10-CM | POA: Diagnosis not present

## 2023-05-25 DIAGNOSIS — Z5112 Encounter for antineoplastic immunotherapy: Secondary | ICD-10-CM | POA: Diagnosis not present

## 2023-05-25 LAB — CBC WITH DIFFERENTIAL (CANCER CENTER ONLY)
Abs Immature Granulocytes: 0.01 10*3/uL (ref 0.00–0.07)
Basophils Absolute: 0.1 10*3/uL (ref 0.0–0.1)
Basophils Relative: 3 %
Eosinophils Absolute: 0.1 10*3/uL (ref 0.0–0.5)
Eosinophils Relative: 2 %
HCT: 19.5 % — ABNORMAL LOW (ref 39.0–52.0)
Hemoglobin: 6.6 g/dL — CL (ref 13.0–17.0)
Immature Granulocytes: 0 %
Lymphocytes Relative: 12 %
Lymphs Abs: 0.4 10*3/uL — ABNORMAL LOW (ref 0.7–4.0)
MCH: 32.4 pg (ref 26.0–34.0)
MCHC: 33.8 g/dL (ref 30.0–36.0)
MCV: 95.6 fL (ref 80.0–100.0)
Monocytes Absolute: 0.4 10*3/uL (ref 0.1–1.0)
Monocytes Relative: 15 %
Neutro Abs: 2 10*3/uL (ref 1.7–7.7)
Neutrophils Relative %: 68 %
Platelet Count: 157 10*3/uL (ref 150–400)
RBC: 2.04 MIL/uL — ABNORMAL LOW (ref 4.22–5.81)
RDW: 19.3 % — ABNORMAL HIGH (ref 11.5–15.5)
WBC Count: 3 10*3/uL — ABNORMAL LOW (ref 4.0–10.5)
nRBC: 0 % (ref 0.0–0.2)

## 2023-05-25 LAB — CMP (CANCER CENTER ONLY)
ALT: 8 U/L (ref 0–44)
AST: 16 U/L (ref 15–41)
Albumin: 3.9 g/dL (ref 3.5–5.0)
Alkaline Phosphatase: 71 U/L (ref 38–126)
Anion gap: 10 (ref 5–15)
BUN: 39 mg/dL — ABNORMAL HIGH (ref 6–20)
CO2: 27 mmol/L (ref 22–32)
Calcium: 8.8 mg/dL — ABNORMAL LOW (ref 8.9–10.3)
Chloride: 98 mmol/L (ref 98–111)
Creatinine: 6.66 mg/dL — ABNORMAL HIGH (ref 0.61–1.24)
GFR, Estimated: 9 mL/min — ABNORMAL LOW (ref >60)
Glucose, Bld: 90 mg/dL (ref 70–99)
Potassium: 4.5 mmol/L (ref 3.5–5.1)
Sodium: 135 mmol/L (ref 135–145)
Total Bilirubin: 0.3 mg/dL (ref 0.0–1.2)
Total Protein: 6.1 g/dL — ABNORMAL LOW (ref 6.5–8.1)

## 2023-05-25 LAB — PREPARE RBC (CROSSMATCH)

## 2023-05-25 LAB — SAMPLE TO BLOOD BANK

## 2023-05-25 MED ORDER — SODIUM CHLORIDE 0.9% IV SOLUTION
250.0000 mL | INTRAVENOUS | Status: DC
Start: 2023-05-25 — End: 2023-05-25
  Administered 2023-05-25: 100 mL via INTRAVENOUS

## 2023-05-25 MED ORDER — ACETAMINOPHEN 325 MG PO TABS
650.0000 mg | ORAL_TABLET | Freq: Once | ORAL | Status: AC
  Administered 2023-05-25: 650 mg via ORAL
  Filled 2023-05-25: qty 2

## 2023-05-25 MED ORDER — DIPHENHYDRAMINE HCL 25 MG PO CAPS: 25.0000 mg | ORAL_CAPSULE | Freq: Once | ORAL | Status: DC

## 2023-05-25 MED ORDER — PALONOSETRON HCL INJECTION 0.25 MG/5ML
0.2500 mg | Freq: Once | INTRAVENOUS | Status: AC
  Administered 2023-05-25: 0.25 mg via INTRAVENOUS
  Filled 2023-05-25: qty 5

## 2023-05-25 MED ORDER — DIPHENHYDRAMINE HCL 25 MG PO CAPS
50.0000 mg | ORAL_CAPSULE | Freq: Once | ORAL | Status: AC
  Administered 2023-05-25: 50 mg via ORAL
  Filled 2023-05-25: qty 2

## 2023-05-25 MED ORDER — SODIUM CHLORIDE 0.9 % IV SOLN
500.0000 mg/m2 | Freq: Once | INTRAVENOUS | Status: AC
  Administered 2023-05-25: 1000 mg via INTRAVENOUS
  Filled 2023-05-25: qty 50

## 2023-05-25 MED ORDER — DEXAMETHASONE 4 MG PO TABS
40.0000 mg | ORAL_TABLET | Freq: Once | ORAL | Status: AC
Start: 2023-05-25 — End: 2023-05-25
  Administered 2023-05-25: 40 mg via ORAL
  Filled 2023-05-25: qty 10

## 2023-05-25 MED ORDER — SODIUM CHLORIDE 0.9 % IV SOLN: Freq: Once | INTRAVENOUS | Status: AC

## 2023-05-25 MED ORDER — DARATUMUMAB-HYALURONIDASE-FIHJ 1800-30000 MG-UT/15ML ~~LOC~~ SOLN
1800.0000 mg | Freq: Once | SUBCUTANEOUS | Status: AC
  Administered 2023-05-25: 1800 mg via SUBCUTANEOUS
  Filled 2023-05-25: qty 15

## 2023-05-25 MED ORDER — BORTEZOMIB CHEMO SQ INJECTION 3.5 MG (2.5MG/ML)
1.5000 mg/m2 | Freq: Once | INTRAMUSCULAR | Status: AC
  Administered 2023-05-25: 3 mg via SUBCUTANEOUS
  Filled 2023-05-25: qty 1.2

## 2023-05-25 MED ORDER — ACETAMINOPHEN 325 MG PO TABS: 650.0000 mg | ORAL_TABLET | Freq: Once | ORAL | Status: DC

## 2023-05-25 NOTE — Progress Notes (Signed)
 CRITICAL VALUE STICKER  CRITICAL VALUE: Hgb 6.6  RECEIVER (on-site recipient of call): [facility]   DATE & TIME NOTIFIED: 2/24 @ 1105  MESSENGER (representative from lab): Melissa  MD NOTIFIED: Dr. Eden Emms  TIME OF NOTIFICATION: 1105  RESPONSE: Patient has appt with Dr. Arbutus Ped

## 2023-05-25 NOTE — Patient Instructions (Signed)
 [facility]  Discharge Instructions: Thank you for choosing [facility] to provide your oncology and hematology care.   If you have a lab appointment with the Cancer Center, please go directly to the Cancer Center and check in at the registration area.   Wear comfortable clothing and clothing appropriate for easy access to any Portacath or PICC line.   We strive to give you quality time with your provider. You may need to reschedule your appointment if you arrive late (15 or more minutes).  Arriving late affects you and other patients whose appointments are after yours.  Also, if you miss three or more appointments without notifying the office, you may be dismissed from the clinic at the provider's discretion.      For prescription refill requests, have your pharmacy contact our office and allow 72 hours for refills to be completed.    Today you received the following chemotherapy and/or immunotherapy agents: Cyclophosphamide, Daratumumab hyaluronidase-fihj (DARZALEX FASPRO, Bortezimab.    To help prevent nausea and vomiting after your treatment, we encourage you to take your nausea medication as directed.  BELOW ARE SYMPTOMS THAT SHOULD BE REPORTED IMMEDIATELY: *FEVER GREATER THAN 100.4 F (38 C) OR HIGHER *CHILLS OR SWEATING *NAUSEA AND VOMITING THAT IS NOT CONTROLLED WITH YOUR NAUSEA MEDICATION *UNUSUAL SHORTNESS OF BREATH *UNUSUAL BRUISING OR BLEEDING *URINARY PROBLEMS (pain or burning when urinating, or frequent urination) *BOWEL PROBLEMS (unusual diarrhea, constipation, pain near the anus) TENDERNESS IN MOUTH AND THROAT WITH OR WITHOUT PRESENCE OF ULCERS (sore throat, sores in mouth, or a toothache) UNUSUAL RASH, SWELLING OR PAIN  UNUSUAL VAGINAL DISCHARGE OR ITCHING   Items with * indicate a potential emergency and should be followed up as soon as possible or go to the Emergency Department if any problems should  occur.  Please show the CHEMOTHERAPY ALERT CARD or IMMUNOTHERAPY ALERT CARD at check-in to the Emergency Department and triage nurse.  Should you have questions after your visit or need to cancel or reschedule your appointment, please contact [facility]  Dept: [phone]  and follow the prompts.  Office hours are 8:00 a.m. to 4:30 p.m. Monday - Friday. Please note that voicemails left after 4:00 p.m. may not be returned until the following business day.  We are closed weekends and major holidays. You have access to a nurse at all times for urgent questions. Please call the main number to the clinic Dept: [phone] and follow the prompts.   For any non-urgent questions, you may also contact your provider using MyChart. We now offer e-Visits for anyone 36 and older to request care online for non-urgent symptoms. For details visit [email or web address].   Also download the MyChart app! Go to the app store, search "MyChart", open the app, select [place], and log in with your MyChart username and password.  Blood Transfusion, Adult, Care After The following information offers guidance on how to care for yourself after your procedure. Your health care provider may also give you more specific instructions. If you have problems or questions, contact your health care provider. What can I expect after the procedure? After the procedure, it is common to have: Bruising and soreness where the IV was inserted. A headache. Follow these instructions at home: IV insertion site care     Follow instructions from your health care provider about how to  take care of your IV insertion site. Make sure you: Wash your hands with soap and water for at least 20 seconds before and after you change your bandage (dressing). If soap and water are not available, use hand sanitizer. Change your dressing as told by your health care provider. Check your IV  insertion site every day for signs of infection. Check for: Redness, swelling, or pain. Bleeding from the site. Warmth. Pus or a bad smell. General instructions Take over-the-counter and prescription medicines only as told by your health care provider. Rest as told by your health care provider. Return to your normal activities as told by your health care provider. Keep all follow-up visits. Lab tests may need to be done at certain periods to recheck your blood counts. Contact a health care provider if: You have itching or red, swollen areas of skin (hives). You have a fever or chills. You have pain in the head, back, or chest. You feel anxious or you feel weak after doing your normal activities. You have redness, swelling, warmth, or pain around the IV insertion site. You have blood coming from the IV insertion site that does not stop with pressure. You have pus or a bad smell coming from your IV insertion site. If you received your blood transfusion in an outpatient setting, you will be told whom to contact to report any reactions. Get help right away if: You have symptoms of a serious allergic or immune system reaction, including: Trouble breathing or shortness of breath. Swelling of the face, feeling flushed, or widespread rash. Dark urine or blood in the urine. Fast heartbeat. These symptoms may be an emergency. Get help right away. Call 911. Do not wait to see if the symptoms will go away. Do not drive yourself to the hospital. Summary Bruising and soreness around the IV insertion site are common. Check your IV insertion site every day for signs of infection. Rest as told by your health care provider. Return to your normal activities as told by your health care provider. Get help right away for symptoms of a serious allergic or immune system reaction to the blood transfusion. This information is not intended to replace advice given to you by your health care provider. Make sure  you discuss any questions you have with your health care provider. Document Revised: 06/14/2021 Document Reviewed: 06/14/2021 Elsevier Patient Education  2024 [employer].

## 2023-05-25 NOTE — Progress Notes (Signed)
 [facility] [phone]   [phone]  OFFICE PROGRESS NOTE  [name], MD [address] [place] [address]  DIAGNOSIS: Recently diagnosed with multiple myeloma with lytic bone lesions in addition to renal insufficiency and bone marrow biopsy and aspirate consistent with plasma cell neoplasm with 53% plasma cells. Drew was diagnosed in August 2024.    PRIOR THERAPY:  Plasmapheresis status post ~4 treatments    CURRENT THERAPY: Systemic chemotherapy with subcutaneous Velcade 1.3 Mg/KG on days 1, 4, 8 and 11 every 3 weeks, cyclophosphamide 500 Mg/M2 IV on days 1 and 8 as well as Decadron 40 mg p.o. on weekly basis start with the first dose of Velcade.  Drew Cooper status post 2 cycles.  Once his renal function improves, Dr. Arbutus Ped will likely recommend changing treatment to standard treatment with daratumumab, Velcade, Revlimid and Decadron for 4 cycles followed by evaluation for autologous stem cell transplant.  Drew Cooper status post 4 cycles of the new regimen with daratumumab subcutaneously, cyclophosphamide, subcutaneous Velcade and dexamethasone every 4 weeks   INTERVAL HISTORY: Drew Cooper 58 y.o. male returns to the clinic today for follow-up visit. Discussed the use of AI scribe software for clinical note transcription with the patient, who gave verbal consent to proceed.  History of Present Illness   Drew Cooper Cooper a 58 year old male with multiple myeloma who presents for cycle five of his treatment regimen.  Drew Cooper undergoing treatment for multiple myeloma, diagnosed in August 2024, and Cooper currently on a regimen that includes subcutaneous daratumumab, subcutaneous Velcade, cyclophosphamide, and Decadron. Drew Cooper here for the fifth cycle of Drew treatment and feels 'pretty good' overall.  His myeloma panel from April 27, 2023, shows significant improvement in his free lambda light chain levels, which have decreased from an  unquantifiable high in August 2024 to 30 in the most recent test.  Drew Cooper experiences nerve pain, described as neuropathy, for which Drew Cooper takes gabapentin. Drew Cooper previously took oxycodone but has since discontinued it.  Drew Cooper also receiving dialysis on Tuesdays, Thursdays, and Saturdays. Drew Cooper was advised to stop taking iron pills due to high iron levels, as per the kidney center's recommendation. Drew Cooper has also stopped taking sodium supplements.  His hemoglobin Cooper noted to be low at 6.6, and Drew Cooper awaiting a blood transfusion.        MEDICAL HISTORY: Past Medical History:  Diagnosis Date   Cancer (HCC) 10/2022   multiple myeloma   Chronic kidney disease     ALLERGIES:  has no known allergies.  MEDICATIONS:  Current Outpatient Medications  Medication Sig Dispense Refill   acetaminophen (TYLENOL) 500 MG tablet Take 500 mg by mouth every 6 (six) hours as needed for moderate pain.     acyclovir (ZOVIRAX) 200 MG capsule Take 1 capsule (200 mg total) by mouth 2 (two) times daily. 60 capsule 2   Calcium Carbonate (CALCIUM 600 PO) Take 600 mg by mouth 2 (two) times daily.     DARZALEX FASPRO 1800-30000 MG-UT/15ML SOLN Inject into the skin.     dexamethasone (DECADRON) 4 MG tablet Take 10 tablets by mouth once a week, skip 9/6 and take it 9/9 instead with treatment (Patient taking differently: Take 10 tablets by mouth once a week, skip 9/6 and take it 9/9 instead with treatment. PT STATES Drew Cooper TOOK ON 12/05/22) 40 tablet 4   ferric citrate (AURYXIA) 1 GM 210 MG(Fe) tablet Take 2 tablets (420  mg total) by mouth 3 (three) times daily with meals. (Patient not taking: Reported on 05/04/2023) 180 tablet 0   ferrous sulfate 325 (65 FE) MG tablet Take 325 mg by mouth daily with breakfast.     furosemide (LASIX) 40 MG tablet Take 40 mg by mouth every other day.     gabapentin (NEURONTIN) 100 MG capsule `T1C BY MOUTH THREE TIMES DAILY 90 capsule 1   oxyCODONE (OXY IR/ROXICODONE) 5 MG immediate release tablet Take 1  tablet (5 mg total) by mouth every 6 (six) hours as needed for moderate pain (pain score 4-6) (for pain score of 1-4). (Patient not taking: Reported on 05/04/2023) 12 tablet 0   prochlorperazine (COMPAZINE) 10 MG tablet Take 1 tablet (10 mg total) by mouth every 6 (six) hours as needed. 30 tablet 2   sodium bicarbonate 650 MG tablet Take 1 tablet (650 mg total) by mouth 2 (two) times daily. 60 tablet 0   No current facility-administered medications for Drew visit.    SURGICAL HISTORY:  Past Surgical History:  Procedure Laterality Date   AV FISTULA PLACEMENT Left 03/26/2023   Procedure: LEFT ARM FISTULA CREATION;  Surgeon: Nada Libman, MD;  Location: [facility] OR;  Service: Vascular;  Laterality: Left;   IR FLUORO GUIDE CV LINE RIGHT  11/13/2022   IR US GUIDE VASC ACCESS RIGHT  11/13/2022   KNEE SURGERY      REVIEW OF SYSTEMS:  Constitutional: positive for fatigue Eyes: negative Ears, nose, mouth, throat, and face: negative Respiratory: negative Cardiovascular: negative Gastrointestinal: negative Genitourinary:negative Integument/breast: negative Hematologic/lymphatic: negative Musculoskeletal:negative Neurological: positive for paresthesia Behavioral/Psych: negative Endocrine: negative Allergic/Immunologic: negative   PHYSICAL EXAMINATION: General appearance: alert, cooperative, fatigued, and no distress Head: Normocephalic, without obvious abnormality, atraumatic Neck: no adenopathy, no JVD, supple, symmetrical, trachea midline, and thyroid not enlarged, symmetric, no tenderness/mass/nodules Lymph nodes: Cervical, supraclavicular, and axillary nodes normal. Resp: clear to auscultation bilaterally Back: symmetric, no curvature. ROM normal. No CVA tenderness. Cardio: regular rate and rhythm, S1, S2 normal, no murmur, click, rub or gallop GI: soft, non-tender; bowel sounds normal; no masses,  no organomegaly Extremities: extremities normal, atraumatic, no cyanosis or  edema Neurologic: Alert and oriented X 3, normal strength and tone. Normal symmetric reflexes. Normal coordination and gait  ECOG PERFORMANCE STATUS: 1 - Symptomatic but completely ambulatory  Blood pressure 128/73, pulse 82, temperature 98.4 F (36.9 C), temperature source Temporal, resp. rate 17, height 5\' 10"  (1.778 m), weight 184 lb 4.8 oz (83.6 kg), SpO2 100%.  LABORATORY DATA: Lab Results  Component Value Date   WBC 3.0 (L) 05/25/2023   HGB 6.6 (LL) 05/25/2023   HCT 19.5 (L) 05/25/2023   MCV 95.6 05/25/2023   PLT 157 05/25/2023      Chemistry      Component Value Date/Time   NA 136 05/11/2023 0810   K 3.9 05/11/2023 0810   CL 97 (L) 05/11/2023 0810   CO2 28 05/11/2023 0810   BUN 53 (H) 05/11/2023 0810   CREATININE 6.51 (H) 05/11/2023 0810      Component Value Date/Time   CALCIUM 8.0 (L) 05/11/2023 0810   ALKPHOS 78 05/11/2023 0810   AST 14 (L) 05/11/2023 0810   ALT 8 05/11/2023 0810   BILITOT 0.3 05/11/2023 0810       RADIOGRAPHIC STUDIES: VAS US DUPLEX DIALYSIS ACCESS (AVF, AVG) Result Date: 05/04/2023 DIALYSIS ACCESS Patient Name:  Drew Cooper  Date of Exam:   05/04/2023 Medical Rec #: [id number]  Accession #:    [id number] Date of Birth: [birth year]               Patient Gender: M Patient Age:   58 years Exam Location:  [facility] Procedure:      VAS US DUPLEX DIALYSIS ACCESS (AVF, AVG) Referring Phys: Coral Else --------------------------------------------------------------------------------  Reason for Exam: Routine follow up. Access Site: Left Upper Extremity. Access Type: Brachial-cephalic AVF. History: S/P Left Brachial-Cephalic AVF. Performing Technologist: Criss Rosales RVT  Examination Guidelines: A complete evaluation includes B-mode imaging, spectral Doppler, color Doppler, and power Doppler as needed of all accessible portions of each vessel. Unilateral testing Cooper considered an integral part of a complete examination.  Limited examinations for reoccurring indications may be performed as noted.  Findings: +--------------------+----------+-----------------+--------+ AVF                 PSV (cm/s)Flow Vol (mL/min)Comments +--------------------+----------+-----------------+--------+ Native artery inflow   214          1644                +--------------------+----------+-----------------+--------+ AVF Anastomosis        499                              +--------------------+----------+-----------------+--------+  +------------+----------+-------------+----------+--------+ OUTFLOW VEINPSV (cm/s)Diameter (cm)Depth (cm)Describe +------------+----------+-------------+----------+--------+ Shoulder       158        0.60        0.40            +------------+----------+-------------+----------+--------+ Prox UA        101        0.75        0.30            +------------+----------+-------------+----------+--------+ Mid UA         154        0.70        0.20            +------------+----------+-------------+----------+--------+ Dist UA        100        0.80        0.10            +------------+----------+-------------+----------+--------+ AC Fossa       540        0.30        0.40            +------------+----------+-------------+----------+--------+  Summary: - Patent left arm brachial-cephalic arteriovenous fistula. - Flow volume: 1644 mL/min. *See table(s) above for measurements and observations.  Diagnosing physician: Coral Else MD Electronically signed by Coral Else MD on 05/04/2023 at 2:36:41 PM.   --------------------------------------------------------------------------------   Final     ASSESSMENT AND PLAN: Drew Cooper a very pleasant 58 years old white male with multiple myeloma with lytic bone lesions in addition to renal insufficiency and bone marrow biopsy and aspirate consistent with plasma cell neoplasm with 53% plasma cells. Drew was diagnosed in August 2024. Drew Cooper  currently undergoing systemic chemotherapy with subcutaneous Velcade 1.3 Mg/KG on days 1, 4, 8 and 11 every 3 weeks, cyclophosphamide 500 Mg/M2 IV on days 1 and 8 as well as Decadron 40 mg p.o. on weekly basis start with the first dose of Velcade.  Drew Cooper status post 3 cycles.  Unfortunately no improvement in his renal function so far Drew Cooper was referred to Dr. [name] at [facility] who recommended adding daratumumab to his current  regimen with the hope for improvement of his renal function.  Drew Cooper status post 4 cycles of the new regimen.  Drew Cooper has been tolerating Drew treatment fairly well except for the fatigue.     Multiple Myeloma Diagnosed in August 2024. Currently on the fifth cycle of daratumumab, subcutaneous Velcade, cyclophosphamide, and Decadron. Significant improvement in free lambda light chain levels from over 3,320 in October to 30 in January 2025. Patient reports feeling physically good with some neuropathy managed with gabapentin. No longer taking oxycodone. Risks include neuropathy and potential kidney damage. Benefits include significant reduction in free lambda light chain levels, indicating effective disease control. Decision to continue current regimen based on positive response and patient's tolerance. - Continue current treatment regimen with daratumumab, subcutaneous Velcade, cyclophosphamide, and Decadron - Administer two units of blood due to low hemoglobin (6.6) - Schedule next visit in four weeks for cycle number six  Anemia Hemoglobin level Cooper low at 6.6. Requires blood transfusion. Risks include potential allergic reactions and iron overload. Benefits include improved hemoglobin levels and symptom relief. - Administer two units of blood either today or within the next two days  Neuropathy Nerve pain as a side effect of multiple myeloma treatment. Managed with gabapentin. No longer taking oxycodone. - Continue gabapentin for neuropathy management  Chronic Kidney  Disease On dialysis three times a week (Tuesdays, Thursdays, and Saturdays). Advised to limit protein intake to avoid overloading kidneys. Kidney damage likely irreversible due to pre-existing condition before treatment. - Continue dialysis as scheduled - Limit protein intake to one Ensure per day  General Health Maintenance Advised to limit iron intake as per kidney center's recommendation. No longer taking sodium supplements. - Discontinue iron pills and sodium supplements as advised by kidney center  Follow-up - Schedule follow-up visit in four weeks.   The patient voices understanding of current disease status and treatment options and Cooper in agreement with the current care plan.  All questions were answered. The patient knows to call the clinic with any problems, questions or concerns. We can certainly see the patient much sooner if necessary.  The total time spent in the appointment was 30 minutes.  Disclaimer: Drew note was dictated with voice recognition software. Similar sounding words can inadvertently be transcribed and may not be corrected upon review.

## 2023-05-27 ENCOUNTER — Inpatient Hospital Stay: Payer: Medicaid Other

## 2023-05-27 DIAGNOSIS — Z5112 Encounter for antineoplastic immunotherapy: Secondary | ICD-10-CM | POA: Diagnosis not present

## 2023-05-27 DIAGNOSIS — D631 Anemia in chronic kidney disease: Secondary | ICD-10-CM

## 2023-05-27 DIAGNOSIS — C9000 Multiple myeloma not having achieved remission: Secondary | ICD-10-CM

## 2023-05-27 MED ORDER — DIPHENHYDRAMINE HCL 25 MG PO CAPS
25.0000 mg | ORAL_CAPSULE | Freq: Once | ORAL | Status: AC
  Administered 2023-05-27: 25 mg via ORAL
  Filled 2023-05-27: qty 1

## 2023-05-27 MED ORDER — SODIUM CHLORIDE 0.9% IV SOLUTION
250.0000 mL | INTRAVENOUS | Status: DC
  Administered 2023-05-27: 250 mL via INTRAVENOUS

## 2023-05-27 MED ORDER — ACETAMINOPHEN 325 MG PO TABS
650.0000 mg | ORAL_TABLET | Freq: Once | ORAL | Status: AC
  Administered 2023-05-27: 650 mg via ORAL
  Filled 2023-05-27: qty 2

## 2023-05-27 NOTE — Patient Instructions (Signed)
 Blood Transfusion, Adult A blood transfusion is a procedure in which you receive blood or a type of blood cell (blood component) through an IV. You may need a blood transfusion when you have a low blood count, which is a low number of any blood cell. This may result from a bleeding disorder, illness, injury, or surgery. The blood may come from a donor, or you may be able to have your own blood collected and stored (autologous blood donation) before a planned surgery. The blood given in a transfusion may be made up of different blood components. You may receive: Red blood cells. These carry oxygen to the cells in the body. Platelets. These help your blood to clot. Plasma. This is the liquid part of your blood. It carries proteins and other substances throughout the body. White blood cells. These help you fight infections. If you have hemophilia or another clotting disorder, you may also receive other types of blood products. Depending on the type of blood product, this procedure may take 1-4 hours to complete. Tell a health care provider about: Any bleeding problems you have. Any previous reactions you have had during a blood transfusion. Any allergies you have. All medicines you are taking, including vitamins, herbs, eye drops, creams, and over-the-counter medicines. Any surgeries you have had. Any medical conditions you have. Whether you are pregnant or may be pregnant. What are the risks? Talk with your health care provider about risks. The most common problems include: A mild allergic reaction, such as red, swollen areas of skin (hives) and itching. Fever or chills. This may be the body's response to new blood cells received. This may occur during or up to 4 hours after the transfusion. More serious problems may include: A serious allergic reaction that causes difficulty breathing or swelling around the face and lips. Transfusion-associated circulatory overload (TACO), or too much fluid in  the lungs. This may cause breathing problems. Transfusion-related acute lung injury (TRALI), which causes breathing difficulty and low oxygen in the blood. This can occur within hours of the transfusion or several days later. Iron overload. This can happen after receiving many blood transfusions over a period of time. Infection or virus being transmitted. This is rare because donated blood is carefully tested before it is given. Hemolytic transfusion reaction. This is rare. It happens when the body's defense system (immune system)tries to attack the new blood cells. Symptoms may include fever, chills, nausea, low blood pressure, and low back or chest pain. Transfusion-associated graft-versus-host disease (TAGVHD). This is rare. It happens when donated cells attack the body's healthy tissues. What happens before the procedure? You will have a blood test to check your blood type. This test is done to know what kind of blood your body will accept and to match it to the donor blood. If you are going to have a planned surgery, you may be able to do an autologous blood donation. This may be done in case you need to have a transfusion. You will have your temperature, blood pressure, and pulse checked before the transfusion. If you have had an allergic reaction to a transfusion in the past, you may be given medicine to help prevent a reaction. This medicine may be given to you by mouth (orally) or through an IV. What happens during the procedure?  An IV will be inserted into one of your veins. The bag of blood will be attached to your IV. The blood will then enter through your vein. Your temperature, blood pressure,  and pulse will be monitored during the transfusion. This monitoring is done to detect early signs of a transfusion reaction. Tell your nurse right away if you have any of these symptoms during the transfusion: Shortness of breath or trouble breathing. Chest or back pain. Fever or  chills. Itching or hives. If you have any signs or symptoms of a reaction, your transfusion will be stopped and you may be given medicine. When the transfusion is complete, your IV will be removed. Pressure may be applied to the IV site for a few minutes. A bandage (dressing)will be applied. The procedure may vary among health care providers and hospitals. What happens after the procedure? Your temperature, blood pressure, pulse, breathing rate, and blood oxygen level will be monitored until you leave the hospital or clinic. Your blood may be tested to see how you have responded to the transfusion. You may be warmed with fluids or blankets to maintain a normal body temperature. If you receive your blood transfusion in an outpatient setting, you will be told whom to contact to report any reactions. Where to find more information Visit the [employer]: redcross.org Summary A blood transfusion is a procedure in which you receive blood or a type of blood cell (blood component) through an IV. The blood given in a transfusion may be made up of different blood components. You may receive red blood cells, platelets, plasma, or white blood cells depending on the condition treated. Your temperature, blood pressure, and pulse will be monitored before, during, and after the transfusion. After the transfusion, your blood may be tested to see how your body has responded. This information is not intended to replace advice given to you by your health care provider. Make sure you discuss any questions you have with your health care provider. Document Revised: 06/14/2021 Document Reviewed: 06/14/2021 Elsevier Patient Education  2024 [employer].

## 2023-05-28 LAB — TYPE AND SCREEN
ABO/RH(D): AB POS
Antibody Screen: POSITIVE
Donor AG Type: NEGATIVE
Donor AG Type: NEGATIVE
Unit division: 0
Unit division: 0

## 2023-05-28 LAB — BPAM RBC
Blood Product Expiration Date: 202503302359
Blood Product Expiration Date: 202502282359
ISSUE DATE / TIME: 202502241451
ISSUE DATE / TIME: 202502260842
Unit Type and Rh: 202503302359
Unit Type and Rh: 5100
Unit Type and Rh: 5100

## 2023-06-01 ENCOUNTER — Inpatient Hospital Stay: Payer: Self-pay

## 2023-06-01 ENCOUNTER — Encounter: Payer: Self-pay | Admitting: Medical Oncology

## 2023-06-01 ENCOUNTER — Inpatient Hospital Stay: Payer: Self-pay | Attending: Internal Medicine

## 2023-06-01 VITALS — BP 145/74 | HR 77 | Temp 97.8°F | Resp 16 | Wt 185.8 lb

## 2023-06-01 DIAGNOSIS — Z5112 Encounter for antineoplastic immunotherapy: Secondary | ICD-10-CM | POA: Diagnosis present

## 2023-06-01 DIAGNOSIS — Z5111 Encounter for antineoplastic chemotherapy: Secondary | ICD-10-CM | POA: Insufficient documentation

## 2023-06-01 DIAGNOSIS — C9000 Multiple myeloma not having achieved remission: Secondary | ICD-10-CM | POA: Insufficient documentation

## 2023-06-01 LAB — CBC WITH DIFFERENTIAL (CANCER CENTER ONLY)
Abs Immature Granulocytes: 0.03 10*3/uL (ref 0.00–0.07)
Basophils Absolute: 0.1 10*3/uL (ref 0.0–0.1)
Basophils Relative: 2 %
Eosinophils Absolute: 0.1 10*3/uL (ref 0.0–0.5)
Eosinophils Relative: 3 %
HCT: 25.6 % — ABNORMAL LOW (ref 39.0–52.0)
Hemoglobin: 8.7 g/dL — ABNORMAL LOW (ref 13.0–17.0)
Immature Granulocytes: 1 %
Lymphocytes Relative: 7 %
Lymphs Abs: 0.3 10*3/uL — ABNORMAL LOW (ref 0.7–4.0)
MCH: 32.2 pg (ref 26.0–34.0)
MCHC: 34 g/dL (ref 30.0–36.0)
MCV: 94.8 fL (ref 80.0–100.0)
Monocytes Absolute: 0.5 10*3/uL (ref 0.1–1.0)
Monocytes Relative: 11 %
Neutro Abs: 3.6 10*3/uL (ref 1.7–7.7)
Neutrophils Relative %: 76 %
Platelet Count: 114 10*3/uL — ABNORMAL LOW (ref 150–400)
RBC: 2.7 MIL/uL — ABNORMAL LOW (ref 4.22–5.81)
RDW: 17.3 % — ABNORMAL HIGH (ref 11.5–15.5)
WBC Count: 4.6 10*3/uL (ref 4.0–10.5)
nRBC: 0 % (ref 0.0–0.2)

## 2023-06-01 LAB — CMP (CANCER CENTER ONLY)
ALT: 7 U/L (ref 0–44)
AST: 14 U/L — ABNORMAL LOW (ref 15–41)
Albumin: 3.8 g/dL (ref 3.5–5.0)
Alkaline Phosphatase: 74 U/L (ref 38–126)
Anion gap: 9 (ref 5–15)
BUN: 39 mg/dL — ABNORMAL HIGH (ref 6–20)
CO2: 28 mmol/L (ref 22–32)
Calcium: 8.4 mg/dL — ABNORMAL LOW (ref 8.9–10.3)
Chloride: 97 mmol/L — ABNORMAL LOW (ref 98–111)
Creatinine: 6.06 mg/dL — ABNORMAL HIGH (ref 0.61–1.24)
GFR, Estimated: 10 mL/min — ABNORMAL LOW (ref >60)
Glucose, Bld: 75 mg/dL (ref 70–99)
Potassium: 3.8 mmol/L (ref 3.5–5.1)
Sodium: 134 mmol/L — ABNORMAL LOW (ref 135–145)
Total Bilirubin: 0.3 mg/dL (ref 0.0–1.2)
Total Protein: 6 g/dL — ABNORMAL LOW (ref 6.5–8.1)

## 2023-06-01 LAB — SAMPLE TO BLOOD BANK

## 2023-06-01 MED ORDER — BORTEZOMIB CHEMO SQ INJECTION 3.5 MG (2.5MG/ML)
1.5000 mg/m2 | Freq: Once | INTRAMUSCULAR | Status: AC
  Administered 2023-06-01: 3 mg via SUBCUTANEOUS
  Filled 2023-06-01: qty 1.2

## 2023-06-01 MED ORDER — SODIUM CHLORIDE 0.9 % IV SOLN
500.0000 mg/m2 | Freq: Once | INTRAVENOUS | Status: AC
  Administered 2023-06-01: 1000 mg via INTRAVENOUS
  Filled 2023-06-01: qty 50

## 2023-06-01 MED ORDER — SODIUM CHLORIDE 0.9 % IV SOLN: INTRAVENOUS | Status: DC

## 2023-06-01 MED ORDER — DEXAMETHASONE 4 MG PO TABS
40.0000 mg | ORAL_TABLET | Freq: Once | ORAL | Status: AC
Start: 2023-06-01 — End: 2023-06-01
  Administered 2023-06-01: 40 mg via ORAL
  Filled 2023-06-01: qty 10

## 2023-06-01 MED ORDER — PALONOSETRON HCL INJECTION 0.25 MG/5ML
0.2500 mg | Freq: Once | INTRAVENOUS | Status: AC
  Administered 2023-06-01: 0.25 mg via INTRAVENOUS
  Filled 2023-06-01: qty 5

## 2023-06-01 NOTE — Patient Instructions (Signed)
 [facility] - A DEPT OF [facility]  Discharge Instructions: Thank you for choosing [facility] to provide your oncology and hematology care.   If you have a lab appointment with the Cancer Center, please go directly to the Cancer Center and check in at the registration area.   Wear comfortable clothing and clothing appropriate for easy access to any Portacath or PICC line.   We strive to give you quality time with your provider. You may need to reschedule your appointment if you arrive late (15 or more minutes).  Arriving late affects you and other patients whose appointments are after yours.  Also, if you miss three or more appointments without notifying the office, you may be dismissed from the clinic at the provider's discretion.      For prescription refill requests, have your pharmacy contact our office and allow 72 hours for refills to be completed.    Today you received the following chemotherapy and/or immunotherapy agents: Cyclophosphamide, Bortezimab.    To help prevent nausea and vomiting after your treatment, we encourage you to take your nausea medication as directed.  BELOW ARE SYMPTOMS THAT SHOULD BE REPORTED IMMEDIATELY: *FEVER GREATER THAN 100.4 F (38 C) OR HIGHER *CHILLS OR SWEATING *NAUSEA AND VOMITING THAT IS NOT CONTROLLED WITH YOUR NAUSEA MEDICATION *UNUSUAL SHORTNESS OF BREATH *UNUSUAL BRUISING OR BLEEDING *URINARY PROBLEMS (pain or burning when urinating, or frequent urination) *BOWEL PROBLEMS (unusual diarrhea, constipation, pain near the anus) TENDERNESS IN MOUTH AND THROAT WITH OR WITHOUT PRESENCE OF ULCERS (sore throat, sores in mouth, or a toothache) UNUSUAL RASH, SWELLING OR PAIN  UNUSUAL VAGINAL DISCHARGE OR ITCHING   Items with * indicate a potential emergency and should be followed up as soon as possible or go to the Emergency Department if any problems should occur.  Please show the CHEMOTHERAPY ALERT CARD  or IMMUNOTHERAPY ALERT CARD at check-in to the Emergency Department and triage nurse.  Should you have questions after your visit or need to cancel or reschedule your appointment, please contact [facility] - A DEPT OF [facility]  Dept: [phone]  and follow the prompts.  Office hours are 8:00 a.m. to 4:30 p.m. Monday - Friday. Please note that voicemails left after 4:00 p.m. may not be returned until the following business day.  We are closed weekends and major holidays. You have access to a nurse at all times for urgent questions. Please call the main number to the clinic Dept: [phone] and follow the prompts.   For any non-urgent questions, you may also contact your provider using MyChart. We now offer e-Visits for anyone 37 and older to request care online for non-urgent symptoms. For details visit [email or web address].   Also download the MyChart app! Go to the app store, search "MyChart", open the app, select [facility], and log in with your MyChart username and password.  Blood Transfusion, Adult, Care After The following information offers guidance on how to care for yourself after your procedure. Your health care provider may also give you more specific instructions. If you have problems or questions, contact your health care provider. What can I expect after the procedure? After the procedure, it is common to have: Bruising and soreness where the IV was inserted. A headache. Follow these instructions at home: IV insertion site care     Follow instructions from your health care provider about how to take care of your  IV insertion site. Make sure you: Wash your hands with soap and water for at least 20 seconds before and after you change your bandage (dressing). If soap and water are not available, use hand sanitizer. Change your dressing as told by your health care provider. Check your IV insertion site every day for signs of infection.  Check for: Redness, swelling, or pain. Bleeding from the site. Warmth. Pus or a bad smell. General instructions Take over-the-counter and prescription medicines only as told by your health care provider. Rest as told by your health care provider. Return to your normal activities as told by your health care provider. Keep all follow-up visits. Lab tests may need to be done at certain periods to recheck your blood counts. Contact a health care provider if: You have itching or red, swollen areas of skin (hives). You have a fever or chills. You have pain in the head, back, or chest. You feel anxious or you feel weak after doing your normal activities. You have redness, swelling, warmth, or pain around the IV insertion site. You have blood coming from the IV insertion site that does not stop with pressure. You have pus or a bad smell coming from your IV insertion site. If you received your blood transfusion in an outpatient setting, you will be told whom to contact to report any reactions. Get help right away if: You have symptoms of a serious allergic or immune system reaction, including: Trouble breathing or shortness of breath. Swelling of the face, feeling flushed, or widespread rash. Dark urine or blood in the urine. Fast heartbeat. These symptoms may be an emergency. Get help right away. Call 911. Do not wait to see if the symptoms will go away. Do not drive yourself to the hospital. Summary Bruising and soreness around the IV insertion site are common. Check your IV insertion site every day for signs of infection. Rest as told by your health care provider. Return to your normal activities as told by your health care provider. Get help right away for symptoms of a serious allergic or immune system reaction to the blood transfusion. This information is not intended to replace advice given to you by your health care provider. Make sure you discuss any questions you have with your  health care provider. Document Revised: 06/14/2021 Document Reviewed: 06/14/2021 Elsevier Patient Education  2024 [employer].

## 2023-06-01 NOTE — Progress Notes (Signed)
 Per Dr. Arbutus Ped ,it is ok to treat pt today with  cytoxan and cyclophosphamide and creatinine=6.06.

## 2023-06-04 ENCOUNTER — Encounter: Payer: Self-pay | Admitting: Internal Medicine

## 2023-06-08 ENCOUNTER — Ambulatory Visit: Payer: Self-pay | Admitting: Internal Medicine

## 2023-06-08 ENCOUNTER — Inpatient Hospital Stay: Payer: Self-pay

## 2023-06-08 ENCOUNTER — Encounter: Payer: Self-pay | Admitting: Internal Medicine

## 2023-06-08 ENCOUNTER — Other Ambulatory Visit: Payer: Self-pay

## 2023-06-08 ENCOUNTER — Other Ambulatory Visit: Payer: Self-pay | Admitting: Medical Oncology

## 2023-06-08 VITALS — BP 132/85 | HR 77 | Temp 97.6°F | Resp 16 | Wt 184.8 lb

## 2023-06-08 DIAGNOSIS — D631 Anemia in chronic kidney disease: Secondary | ICD-10-CM

## 2023-06-08 DIAGNOSIS — C9000 Multiple myeloma not having achieved remission: Secondary | ICD-10-CM

## 2023-06-08 DIAGNOSIS — Z5112 Encounter for antineoplastic immunotherapy: Secondary | ICD-10-CM | POA: Diagnosis not present

## 2023-06-08 DIAGNOSIS — D649 Anemia, unspecified: Secondary | ICD-10-CM

## 2023-06-08 LAB — CBC WITH DIFFERENTIAL (CANCER CENTER ONLY)
Abs Immature Granulocytes: 0.01 10*3/uL (ref 0.00–0.07)
Basophils Absolute: 0.1 10*3/uL (ref 0.0–0.1)
Basophils Relative: 2 %
Eosinophils Absolute: 0.1 10*3/uL (ref 0.0–0.5)
Eosinophils Relative: 3 %
HCT: 23 % — ABNORMAL LOW (ref 39.0–52.0)
Hemoglobin: 7.7 g/dL — ABNORMAL LOW (ref 13.0–17.0)
Immature Granulocytes: 0 %
Lymphocytes Relative: 10 %
Lymphs Abs: 0.3 10*3/uL — ABNORMAL LOW (ref 0.7–4.0)
MCH: 31.6 pg (ref 26.0–34.0)
MCHC: 33.5 g/dL (ref 30.0–36.0)
MCV: 94.3 fL (ref 80.0–100.0)
Monocytes Absolute: 0.3 10*3/uL (ref 0.1–1.0)
Monocytes Relative: 12 %
Neutro Abs: 1.9 10*3/uL (ref 1.7–7.7)
Neutrophils Relative %: 73 %
Platelet Count: 123 10*3/uL — ABNORMAL LOW (ref 150–400)
RBC: 2.44 MIL/uL — ABNORMAL LOW (ref 4.22–5.81)
RDW: 17.1 % — ABNORMAL HIGH (ref 11.5–15.5)
WBC Count: 2.6 10*3/uL — ABNORMAL LOW (ref 4.0–10.5)
nRBC: 0 % (ref 0.0–0.2)

## 2023-06-08 LAB — CMP (CANCER CENTER ONLY)
ALT: 9 U/L (ref 0–44)
AST: 16 U/L (ref 15–41)
Albumin: 3.9 g/dL (ref 3.5–5.0)
Alkaline Phosphatase: 76 U/L (ref 38–126)
Anion gap: 11 (ref 5–15)
BUN: 50 mg/dL — ABNORMAL HIGH (ref 6–20)
CO2: 26 mmol/L (ref 22–32)
Calcium: 8 mg/dL — ABNORMAL LOW (ref 8.9–10.3)
Chloride: 97 mmol/L — ABNORMAL LOW (ref 98–111)
Creatinine: 6.27 mg/dL — ABNORMAL HIGH (ref 0.61–1.24)
GFR, Estimated: 10 mL/min — ABNORMAL LOW (ref >60)
Glucose, Bld: 121 mg/dL — ABNORMAL HIGH (ref 70–99)
Potassium: 3.9 mmol/L (ref 3.5–5.1)
Sodium: 134 mmol/L — ABNORMAL LOW (ref 135–145)
Total Bilirubin: 0.4 mg/dL (ref 0.0–1.2)
Total Protein: 5.9 g/dL — ABNORMAL LOW (ref 6.5–8.1)

## 2023-06-08 LAB — PREPARE RBC (CROSSMATCH)

## 2023-06-08 LAB — SAMPLE TO BLOOD BANK

## 2023-06-08 MED ORDER — SODIUM CHLORIDE 0.9 % IV SOLN
500.0000 mg/m2 | Freq: Once | INTRAVENOUS | Status: AC
  Administered 2023-06-08: 1000 mg via INTRAVENOUS
  Filled 2023-06-08: qty 50

## 2023-06-08 MED ORDER — SODIUM CHLORIDE 0.9 % IV SOLN: INTRAVENOUS | Status: DC

## 2023-06-08 MED ORDER — BORTEZOMIB CHEMO SQ INJECTION 3.5 MG (2.5MG/ML)
1.5000 mg/m2 | Freq: Once | INTRAMUSCULAR | Status: AC
  Administered 2023-06-08: 3 mg via SUBCUTANEOUS
  Filled 2023-06-08: qty 1.2

## 2023-06-08 MED ORDER — ACETAMINOPHEN 325 MG PO TABS
650.0000 mg | ORAL_TABLET | Freq: Once | ORAL | Status: AC
Start: 2023-06-08 — End: 2023-06-08
  Administered 2023-06-08: 650 mg via ORAL
  Filled 2023-06-08: qty 2

## 2023-06-08 MED ORDER — DIPHENHYDRAMINE HCL 25 MG PO CAPS
50.0000 mg | ORAL_CAPSULE | Freq: Once | ORAL | Status: AC
  Administered 2023-06-08: 50 mg via ORAL
  Filled 2023-06-08: qty 2

## 2023-06-08 MED ORDER — DARATUMUMAB-HYALURONIDASE-FIHJ 1800-30000 MG-UT/15ML ~~LOC~~ SOLN
1800.0000 mg | Freq: Once | SUBCUTANEOUS | Status: AC
  Administered 2023-06-08: 1800 mg via SUBCUTANEOUS
  Filled 2023-06-08: qty 15

## 2023-06-08 MED ORDER — PALONOSETRON HCL INJECTION 0.25 MG/5ML
0.2500 mg | Freq: Once | INTRAVENOUS | Status: AC
  Administered 2023-06-08: 0.25 mg via INTRAVENOUS
  Filled 2023-06-08: qty 5

## 2023-06-08 MED ORDER — DEXAMETHASONE 4 MG PO TABS
40.0000 mg | ORAL_TABLET | Freq: Once | ORAL | Status: AC
  Administered 2023-06-08: 40 mg via ORAL
  Filled 2023-06-08: qty 10

## 2023-06-08 NOTE — Progress Notes (Signed)
 Per Cassie H, PA patient okay to proceed with treatment today with hemoglobin 7.7 and creatine 6.27. Appointment to receive blood later this week.

## 2023-06-08 NOTE — Patient Instructions (Signed)
 [facility]  Discharge Instructions: Thank you for choosing [facility] to provide your oncology and hematology care.   If you have a lab appointment with the Cancer Center, please go directly to the Cancer Center and check in at the registration area.   Wear comfortable clothing and clothing appropriate for easy access to any Portacath or PICC line.   We strive to give you quality time with your provider. You may need to reschedule your appointment if you arrive late (15 or more minutes).  Arriving late affects you and other patients whose appointments are after yours.  Also, if you miss three or more appointments without notifying the office, you may be dismissed from the clinic at the provider's discretion.      For prescription refill requests, have your pharmacy contact our office and allow 72 hours for refills to be completed.    Today you received the following chemotherapy and/or immunotherapy agents: Cyclophosphamide, Daratumumab hyaluronidase-fihj (DARZALEX FASPRO, Bortezimab.    To help prevent nausea and vomiting after your treatment, we encourage you to take your nausea medication as directed.  BELOW ARE SYMPTOMS THAT SHOULD BE REPORTED IMMEDIATELY: *FEVER GREATER THAN 100.4 F (38 C) OR HIGHER *CHILLS OR SWEATING *NAUSEA AND VOMITING THAT IS NOT CONTROLLED WITH YOUR NAUSEA MEDICATION *UNUSUAL SHORTNESS OF BREATH *UNUSUAL BRUISING OR BLEEDING *URINARY PROBLEMS (pain or burning when urinating, or frequent urination) *BOWEL PROBLEMS (unusual diarrhea, constipation, pain near the anus) TENDERNESS IN MOUTH AND THROAT WITH OR WITHOUT PRESENCE OF ULCERS (sore throat, sores in mouth, or a toothache) UNUSUAL RASH, SWELLING OR PAIN  UNUSUAL VAGINAL DISCHARGE OR ITCHING   Items with * indicate a potential emergency and should be followed up as soon as possible or go to the Emergency Department if any problems should  occur.  Please show the CHEMOTHERAPY ALERT CARD or IMMUNOTHERAPY ALERT CARD at check-in to the Emergency Department and triage nurse.  Should you have questions after your visit or need to cancel or reschedule your appointment, please contact [facility]  Dept: [phone]  and follow the prompts.  Office hours are 8:00 a.m. to 4:30 p.m. Monday - Friday. Please note that voicemails left after 4:00 p.m. may not be returned until the following business day.  We are closed weekends and major holidays. You have access to a nurse at all times for urgent questions. Please call the main number to the clinic Dept: [phone] and follow the prompts.   For any non-urgent questions, you may also contact your provider using MyChart. We now offer e-Visits for anyone 36 and older to request care online for non-urgent symptoms. For details visit [email or web address].   Also download the MyChart app! Go to the app store, search "MyChart", open the app, select [place], and log in with your MyChart username and password.  Blood Transfusion, Adult, Care After The following information offers guidance on how to care for yourself after your procedure. Your health care provider may also give you more specific instructions. If you have problems or questions, contact your health care provider. What can I expect after the procedure? After the procedure, it is common to have: Bruising and soreness where the IV was inserted. A headache. Follow these instructions at home: IV insertion site care     Follow instructions from your health care provider about how to  take care of your IV insertion site. Make sure you: Wash your hands with soap and water for at least 20 seconds before and after you change your bandage (dressing). If soap and water are not available, use hand sanitizer. Change your dressing as told by your health care provider. Check your IV  insertion site every day for signs of infection. Check for: Redness, swelling, or pain. Bleeding from the site. Warmth. Pus or a bad smell. General instructions Take over-the-counter and prescription medicines only as told by your health care provider. Rest as told by your health care provider. Return to your normal activities as told by your health care provider. Keep all follow-up visits. Lab tests may need to be done at certain periods to recheck your blood counts. Contact a health care provider if: You have itching or red, swollen areas of skin (hives). You have a fever or chills. You have pain in the head, back, or chest. You feel anxious or you feel weak after doing your normal activities. You have redness, swelling, warmth, or pain around the IV insertion site. You have blood coming from the IV insertion site that does not stop with pressure. You have pus or a bad smell coming from your IV insertion site. If you received your blood transfusion in an outpatient setting, you will be told whom to contact to report any reactions. Get help right away if: You have symptoms of a serious allergic or immune system reaction, including: Trouble breathing or shortness of breath. Swelling of the face, feeling flushed, or widespread rash. Dark urine or blood in the urine. Fast heartbeat. These symptoms may be an emergency. Get help right away. Call 911. Do not wait to see if the symptoms will go away. Do not drive yourself to the hospital. Summary Bruising and soreness around the IV insertion site are common. Check your IV insertion site every day for signs of infection. Rest as told by your health care provider. Return to your normal activities as told by your health care provider. Get help right away for symptoms of a serious allergic or immune system reaction to the blood transfusion. This information is not intended to replace advice given to you by your health care provider. Make sure  you discuss any questions you have with your health care provider. Document Revised: 06/14/2021 Document Reviewed: 06/14/2021 Elsevier Patient Education  2024 [employer].

## 2023-06-10 ENCOUNTER — Inpatient Hospital Stay

## 2023-06-10 DIAGNOSIS — C9000 Multiple myeloma not having achieved remission: Secondary | ICD-10-CM

## 2023-06-10 DIAGNOSIS — D631 Anemia in chronic kidney disease: Secondary | ICD-10-CM

## 2023-06-10 DIAGNOSIS — Z5112 Encounter for antineoplastic immunotherapy: Secondary | ICD-10-CM | POA: Diagnosis not present

## 2023-06-10 DIAGNOSIS — D649 Anemia, unspecified: Secondary | ICD-10-CM

## 2023-06-10 MED ORDER — SODIUM CHLORIDE 0.9% FLUSH: 10.0000 mL | INTRAVENOUS | Status: DC | PRN

## 2023-06-10 MED ORDER — ACETAMINOPHEN 325 MG PO TABS
650.0000 mg | ORAL_TABLET | Freq: Once | ORAL | Status: AC
  Administered 2023-06-10: 650 mg via ORAL
  Filled 2023-06-10: qty 2

## 2023-06-10 MED ORDER — DIPHENHYDRAMINE HCL 25 MG PO CAPS
25.0000 mg | ORAL_CAPSULE | Freq: Once | ORAL | Status: AC
Start: 2023-06-10 — End: 2023-06-10
  Administered 2023-06-10: 25 mg via ORAL
  Filled 2023-06-10: qty 1

## 2023-06-10 MED ORDER — HEPARIN SOD (PORK) LOCK FLUSH 100 UNIT/ML IV SOLN
500.0000 [IU] | Freq: Every day | INTRAVENOUS | Status: DC | PRN
Start: 2023-06-10 — End: 2023-06-10

## 2023-06-10 MED ORDER — SODIUM CHLORIDE 0.9% IV SOLUTION
250.0000 mL | INTRAVENOUS | Status: DC
  Administered 2023-06-10: 100 mL via INTRAVENOUS

## 2023-06-10 NOTE — Patient Instructions (Signed)

## 2023-06-11 LAB — BPAM RBC
Blood Product Expiration Date: 202504062359
ISSUE DATE / TIME: 202503120931
Unit Type and Rh: 202504062359
Unit Type and Rh: 6200

## 2023-06-11 LAB — TYPE AND SCREEN
ABO/RH(D): AB POS
Antibody Screen: POSITIVE
Donor AG Type: NEGATIVE
Unit division: 0

## 2023-06-12 ENCOUNTER — Other Ambulatory Visit: Payer: Self-pay

## 2023-06-16 ENCOUNTER — Encounter: Payer: Self-pay | Admitting: Internal Medicine

## 2023-06-20 ENCOUNTER — Other Ambulatory Visit: Payer: Self-pay

## 2023-06-22 ENCOUNTER — Inpatient Hospital Stay (HOSPITAL_BASED_OUTPATIENT_CLINIC_OR_DEPARTMENT_OTHER): Payer: Self-pay | Admitting: Internal Medicine

## 2023-06-22 ENCOUNTER — Encounter: Payer: Self-pay | Admitting: Internal Medicine

## 2023-06-22 ENCOUNTER — Other Ambulatory Visit: Payer: Self-pay | Admitting: Internal Medicine

## 2023-06-22 ENCOUNTER — Inpatient Hospital Stay: Payer: Self-pay

## 2023-06-22 ENCOUNTER — Encounter: Payer: Self-pay | Admitting: Medical Oncology

## 2023-06-22 VITALS — BP 136/76 | HR 79 | Temp 97.5°F | Resp 17 | Wt 183.6 lb

## 2023-06-22 DIAGNOSIS — C9000 Multiple myeloma not having achieved remission: Secondary | ICD-10-CM | POA: Diagnosis not present

## 2023-06-22 DIAGNOSIS — Z5112 Encounter for antineoplastic immunotherapy: Secondary | ICD-10-CM | POA: Diagnosis not present

## 2023-06-22 LAB — CBC WITH DIFFERENTIAL (CANCER CENTER ONLY)
Abs Immature Granulocytes: 0.02 10*3/uL (ref 0.00–0.07)
Basophils Absolute: 0.1 10*3/uL (ref 0.0–0.1)
Basophils Relative: 3 %
Eosinophils Absolute: 0.1 10*3/uL (ref 0.0–0.5)
Eosinophils Relative: 2 %
HCT: 25.1 % — ABNORMAL LOW (ref 39.0–52.0)
Hemoglobin: 8.6 g/dL — ABNORMAL LOW (ref 13.0–17.0)
Immature Granulocytes: 1 %
Lymphocytes Relative: 10 %
Lymphs Abs: 0.4 10*3/uL — ABNORMAL LOW (ref 0.7–4.0)
MCH: 32.3 pg (ref 26.0–34.0)
MCHC: 34.3 g/dL (ref 30.0–36.0)
MCV: 94.4 fL (ref 80.0–100.0)
Monocytes Absolute: 0.5 10*3/uL (ref 0.1–1.0)
Monocytes Relative: 13 %
Neutro Abs: 2.9 10*3/uL (ref 1.7–7.7)
Neutrophils Relative %: 71 %
Platelet Count: 156 10*3/uL (ref 150–400)
RBC: 2.66 MIL/uL — ABNORMAL LOW (ref 4.22–5.81)
RDW: 17.3 % — ABNORMAL HIGH (ref 11.5–15.5)
WBC Count: 4 10*3/uL (ref 4.0–10.5)
nRBC: 0 % (ref 0.0–0.2)

## 2023-06-22 LAB — CMP (CANCER CENTER ONLY)
ALT: 10 U/L (ref 0–44)
AST: 19 U/L (ref 15–41)
Albumin: 4.2 g/dL (ref 3.5–5.0)
Alkaline Phosphatase: 69 U/L (ref 38–126)
Anion gap: 11 (ref 5–15)
BUN: 50 mg/dL — ABNORMAL HIGH (ref 6–20)
CO2: 25 mmol/L (ref 22–32)
Calcium: 8.7 mg/dL — ABNORMAL LOW (ref 8.9–10.3)
Chloride: 99 mmol/L (ref 98–111)
Creatinine: 6.38 mg/dL — ABNORMAL HIGH (ref 0.61–1.24)
GFR, Estimated: 9 mL/min — ABNORMAL LOW (ref >60)
Glucose, Bld: 87 mg/dL (ref 70–99)
Potassium: 3.7 mmol/L (ref 3.5–5.1)
Sodium: 135 mmol/L (ref 135–145)
Total Bilirubin: 0.3 mg/dL (ref 0.0–1.2)
Total Protein: 6.6 g/dL (ref 6.5–8.1)

## 2023-06-22 MED ORDER — SODIUM CHLORIDE 0.9 % IV SOLN: INTRAVENOUS | Status: DC

## 2023-06-22 MED ORDER — BORTEZOMIB CHEMO SQ INJECTION 3.5 MG (2.5MG/ML)
1.5000 mg/m2 | Freq: Once | INTRAMUSCULAR | Status: AC
  Administered 2023-06-22: 3 mg via SUBCUTANEOUS
  Filled 2023-06-22: qty 1.2

## 2023-06-22 MED ORDER — SODIUM CHLORIDE 0.9 % IV SOLN
500.0000 mg/m2 | Freq: Once | INTRAVENOUS | Status: AC
  Administered 2023-06-22: 1000 mg via INTRAVENOUS
  Filled 2023-06-22: qty 50

## 2023-06-22 MED ORDER — ACETAMINOPHEN 325 MG PO TABS
650.0000 mg | ORAL_TABLET | Freq: Once | ORAL | Status: AC
Start: 2023-06-22 — End: 2023-06-22
  Administered 2023-06-22: 650 mg via ORAL
  Filled 2023-06-22: qty 2

## 2023-06-22 MED ORDER — PALONOSETRON HCL INJECTION 0.25 MG/5ML
0.2500 mg | Freq: Once | INTRAVENOUS | Status: AC
  Administered 2023-06-22: 0.25 mg via INTRAVENOUS
  Filled 2023-06-22: qty 5

## 2023-06-22 MED ORDER — DIPHENHYDRAMINE HCL 25 MG PO CAPS
50.0000 mg | ORAL_CAPSULE | Freq: Once | ORAL | Status: AC
  Administered 2023-06-22: 50 mg via ORAL
  Filled 2023-06-22: qty 2

## 2023-06-22 MED ORDER — DEXAMETHASONE 4 MG PO TABS
40.0000 mg | ORAL_TABLET | Freq: Once | ORAL | Status: AC
Start: 2023-06-22 — End: 2023-06-22
  Administered 2023-06-22: 40 mg via ORAL
  Filled 2023-06-22: qty 10

## 2023-06-22 MED ORDER — DARATUMUMAB-HYALURONIDASE-FIHJ 1800-30000 MG-UT/15ML ~~LOC~~ SOLN
1800.0000 mg | Freq: Once | SUBCUTANEOUS | Status: AC
  Administered 2023-06-22: 1800 mg via SUBCUTANEOUS
  Filled 2023-06-22: qty 15

## 2023-06-22 NOTE — Patient Instructions (Signed)
 [facility]  Discharge Instructions: Thank you for choosing [facility] to provide your oncology and hematology care.   If you have a lab appointment with the Cancer Center, please go directly to the Cancer Center and check in at the registration area.   Wear comfortable clothing and clothing appropriate for easy access to any Portacath or PICC line.   We strive to give you quality time with your provider. You may need to reschedule your appointment if you arrive late (15 or more minutes).  Arriving late affects you and other patients whose appointments are after yours.  Also, if you miss three or more appointments without notifying the office, you may be dismissed from the clinic at the provider's discretion.      For prescription refill requests, have your pharmacy contact our office and allow 72 hours for refills to be completed.    Today you received the following chemotherapy and/or immunotherapy agents: Cyclophosphamide, Daratumumab hyaluronidase-fihj (DARZALEX FASPRO, Bortezimab.    To help prevent nausea and vomiting after your treatment, we encourage you to take your nausea medication as directed.  BELOW ARE SYMPTOMS THAT SHOULD BE REPORTED IMMEDIATELY: *FEVER GREATER THAN 100.4 F (38 C) OR HIGHER *CHILLS OR SWEATING *NAUSEA AND VOMITING THAT IS NOT CONTROLLED WITH YOUR NAUSEA MEDICATION *UNUSUAL SHORTNESS OF BREATH *UNUSUAL BRUISING OR BLEEDING *URINARY PROBLEMS (pain or burning when urinating, or frequent urination) *BOWEL PROBLEMS (unusual diarrhea, constipation, pain near the anus) TENDERNESS IN MOUTH AND THROAT WITH OR WITHOUT PRESENCE OF ULCERS (sore throat, sores in mouth, or a toothache) UNUSUAL RASH, SWELLING OR PAIN  UNUSUAL VAGINAL DISCHARGE OR ITCHING   Items with * indicate a potential emergency and should be followed up as soon as possible or go to the Emergency Department if any problems should  occur.  Please show the CHEMOTHERAPY ALERT CARD or IMMUNOTHERAPY ALERT CARD at check-in to the Emergency Department and triage nurse.  Should you have questions after your visit or need to cancel or reschedule your appointment, please contact [facility]  Dept: [phone]  and follow the prompts.  Office hours are 8:00 a.m. to 4:30 p.m. Monday - Friday. Please note that voicemails left after 4:00 p.m. may not be returned until the following business day.  We are closed weekends and major holidays. You have access to a nurse at all times for urgent questions. Please call the main number to the clinic Dept: [phone] and follow the prompts.   For any non-urgent questions, you may also contact your provider using MyChart. We now offer e-Visits for anyone 36 and older to request care online for non-urgent symptoms. For details visit [email or web address].   Also download the MyChart app! Go to the app store, search "MyChart", open the app, select [place], and log in with your MyChart username and password.  Blood Transfusion, Adult, Care After The following information offers guidance on how to care for yourself after your procedure. Your health care provider may also give you more specific instructions. If you have problems or questions, contact your health care provider. What can I expect after the procedure? After the procedure, it is common to have: Bruising and soreness where the IV was inserted. A headache. Follow these instructions at home: IV insertion site care     Follow instructions from your health care provider about how to  take care of your IV insertion site. Make sure you: Wash your hands with soap and water for at least 20 seconds before and after you change your bandage (dressing). If soap and water are not available, use hand sanitizer. Change your dressing as told by your health care provider. Check your IV  insertion site every day for signs of infection. Check for: Redness, swelling, or pain. Bleeding from the site. Warmth. Pus or a bad smell. General instructions Take over-the-counter and prescription medicines only as told by your health care provider. Rest as told by your health care provider. Return to your normal activities as told by your health care provider. Keep all follow-up visits. Lab tests may need to be done at certain periods to recheck your blood counts. Contact a health care provider if: You have itching or red, swollen areas of skin (hives). You have a fever or chills. You have pain in the head, back, or chest. You feel anxious or you feel weak after doing your normal activities. You have redness, swelling, warmth, or pain around the IV insertion site. You have blood coming from the IV insertion site that does not stop with pressure. You have pus or a bad smell coming from your IV insertion site. If you received your blood transfusion in an outpatient setting, you will be told whom to contact to report any reactions. Get help right away if: You have symptoms of a serious allergic or immune system reaction, including: Trouble breathing or shortness of breath. Swelling of the face, feeling flushed, or widespread rash. Dark urine or blood in the urine. Fast heartbeat. These symptoms may be an emergency. Get help right away. Call 911. Do not wait to see if the symptoms will go away. Do not drive yourself to the hospital. Summary Bruising and soreness around the IV insertion site are common. Check your IV insertion site every day for signs of infection. Rest as told by your health care provider. Return to your normal activities as told by your health care provider. Get help right away for symptoms of a serious allergic or immune system reaction to the blood transfusion. This information is not intended to replace advice given to you by your health care provider. Make sure  you discuss any questions you have with your health care provider. Document Revised: 06/14/2021 Document Reviewed: 06/14/2021 Elsevier Patient Education  2024 [employer].

## 2023-06-22 NOTE — Progress Notes (Signed)
 Per Dr. Arbutus Ped, it is okay to treat pt today with Cytoxan, Velade and Darzelex and creatinine =6.38.

## 2023-06-22 NOTE — Progress Notes (Signed)
 [facility] [phone]   [phone]  OFFICE PROGRESS NOTE  [name], MD [address] [place] [address]  DIAGNOSIS: Recently diagnosed with multiple myeloma with lytic bone lesions in addition to renal insufficiency and bone marrow biopsy and aspirate consistent with plasma cell neoplasm with 53% plasma cells. This was diagnosed in August 2024.    PRIOR THERAPY:  Plasmapheresis status post ~4 treatments    CURRENT THERAPY: Systemic chemotherapy with subcutaneous Velcade 1.3 Mg/KG on days 1, 4, 8 and 11 every 3 weeks, cyclophosphamide 500 Mg/M2 IV on days 1 and 8 as well as Decadron 40 mg p.o. on weekly basis start with the first dose of Velcade.  He is status post 2 cycles.  Once his renal function improves, Dr. Arbutus Ped will likely recommend changing treatment to standard treatment with daratumumab, Velcade, Revlimid and Decadron for 4 cycles followed by evaluation for autologous stem cell transplant.  He is status post 5 cycles of the new regimen with daratumumab subcutaneously, cyclophosphamide, subcutaneous Velcade and dexamethasone every 4 weeks   INTERVAL HISTORY: Drew Cooper 58 y.o. male returns to the clinic today for follow-up visit. Discussed the use of AI scribe software for clinical note transcription with the patient, who gave verbal consent to proceed.  History of Present Illness   Drew Cooper is a 58 year old male with multiple myeloma and renal insufficiency who presents for cycle six of chemotherapy treatment.  He was diagnosed with multiple myeloma in August 2024, which has resulted in significant renal insufficiency. He is currently undergoing treatment with subcutaneous daratumumab, subcutaneous Velcade, Revlimid, and Decadron. He has completed five cycles of this regimen and is here to start cycle number six.  He has renal failure secondary to multiple myeloma and is currently on hemodialysis. He  undergoes dialysis three times a week on Tuesday, Thursday, and Saturday. Dialysis sometimes makes him feel tired.  Since his last visit four weeks ago, he has not experienced any new side effects from the treatment. He describes variability in his energy levels, stating that sometimes he gets tired and sore, but other days he feels good. He associates some of the tiredness with dialysis and the treatments. No recent weight loss, night sweats, nausea, vomiting, or diarrhea. His hemoglobin level is 8.6, and he does not require a transfusion at this time.        MEDICAL HISTORY: Past Medical History:  Diagnosis Date   Cancer (HCC) 10/2022   multiple myeloma   Chronic kidney disease     ALLERGIES:  has no known allergies.  MEDICATIONS:  Current Outpatient Medications  Medication Sig Dispense Refill   acetaminophen (TYLENOL) 500 MG tablet Take 500 mg by mouth every 6 (six) hours as needed for moderate pain.     acyclovir (ZOVIRAX) 200 MG capsule Take 1 capsule (200 mg total) by mouth 2 (two) times daily. 60 capsule 2   Calcium Carbonate (CALCIUM 600 PO) Take 600 mg by mouth 2 (two) times daily.     DARZALEX FASPRO 1800-30000 MG-UT/15ML SOLN Inject into the skin.     dexamethasone (DECADRON) 4 MG tablet Take 10 tablets by mouth once a week, skip 9/6 and take it 9/9 instead with treatment (Patient taking differently: Take 10 tablets by mouth once a week, skip 9/6 and take it 9/9 instead with treatment. PT STATES HE TOOK ON 12/05/22) 40 tablet 4   ferric citrate (AURYXIA) 1 GM 210 MG(Fe) tablet Take  2 tablets (420 mg total) by mouth 3 (three) times daily with meals. (Patient not taking: Reported on 05/04/2023) 180 tablet 0   ferrous sulfate 325 (65 FE) MG tablet Take 325 mg by mouth daily with breakfast.     furosemide (LASIX) 40 MG tablet Take 40 mg by mouth every other day.     gabapentin (NEURONTIN) 100 MG capsule `T1C BY MOUTH THREE TIMES DAILY 90 capsule 1   oxyCODONE (OXY IR/ROXICODONE) 5 MG  immediate release tablet Take 1 tablet (5 mg total) by mouth every 6 (six) hours as needed for moderate pain (pain score 4-6) (for pain score of 1-4). (Patient not taking: Reported on 05/04/2023) 12 tablet 0   prochlorperazine (COMPAZINE) 10 MG tablet Take 1 tablet (10 mg total) by mouth every 6 (six) hours as needed. 30 tablet 2   sodium bicarbonate 650 MG tablet Take 1 tablet (650 mg total) by mouth 2 (two) times daily. 60 tablet 0   No current facility-administered medications for this visit.    SURGICAL HISTORY:  Past Surgical History:  Procedure Laterality Date   AV FISTULA PLACEMENT Left 03/26/2023   Procedure: LEFT ARM FISTULA CREATION;  Surgeon: Nada Libman, MD;  Location: [facility] OR;  Service: Vascular;  Laterality: Left;   IR FLUORO GUIDE CV LINE RIGHT  11/13/2022   IR US GUIDE VASC ACCESS RIGHT  11/13/2022   KNEE SURGERY      REVIEW OF SYSTEMS:  A comprehensive review of systems was negative except for: Constitutional: positive for fatigue   PHYSICAL EXAMINATION: General appearance: alert, cooperative, fatigued, and no distress Head: Normocephalic, without obvious abnormality, atraumatic Neck: no adenopathy, no JVD, supple, symmetrical, trachea midline, and thyroid not enlarged, symmetric, no tenderness/mass/nodules Lymph nodes: Cervical, supraclavicular, and axillary nodes normal. Resp: clear to auscultation bilaterally Back: symmetric, no curvature. ROM normal. No CVA tenderness. Cardio: regular rate and rhythm, S1, S2 normal, no murmur, click, rub or gallop GI: soft, non-tender; bowel sounds normal; no masses,  no organomegaly Extremities: extremities normal, atraumatic, no cyanosis or edema  ECOG PERFORMANCE STATUS: 1 - Symptomatic but completely ambulatory  Blood pressure 136/76, pulse 79, temperature (!) 97.5 F (36.4 C), temperature source Temporal, resp. rate 17, weight 183 lb 9.6 oz (83.3 kg), SpO2 100%.  LABORATORY DATA: Lab Results  Component Value Date   WBC  4.0 06/22/2023   HGB 8.6 (L) 06/22/2023   HCT 25.1 (L) 06/22/2023   MCV 94.4 06/22/2023   PLT 156 06/22/2023      Chemistry      Component Value Date/Time   NA 134 (L) 06/08/2023 1120   K 3.9 06/08/2023 1120   CL 97 (L) 06/08/2023 1120   CO2 26 06/08/2023 1120   BUN 50 (H) 06/08/2023 1120   CREATININE 6.27 (H) 06/08/2023 1120      Component Value Date/Time   CALCIUM 8.0 (L) 06/08/2023 1120   ALKPHOS 76 06/08/2023 1120   AST 16 06/08/2023 1120   ALT 9 06/08/2023 1120   BILITOT 0.4 06/08/2023 1120       RADIOGRAPHIC STUDIES: No results found.   ASSESSMENT AND PLAN: This is a very pleasant 58 years old white male with multiple myeloma with lytic bone lesions in addition to renal insufficiency and bone marrow biopsy and aspirate consistent with plasma cell neoplasm with 53% plasma cells. This was diagnosed in August 2024. He is currently undergoing systemic chemotherapy with subcutaneous Velcade 1.3 Mg/KG on days 1, 4, 8 and 11 every 3 weeks, cyclophosphamide  500 Mg/M2 IV on days 1 and 8 as well as Decadron 40 mg p.o. on weekly basis start with the first dose of Velcade.  He is status post 3 cycles.  Unfortunately no improvement in his renal function so far He was referred to Dr. [name] at [facility] who recommended adding daratumumab to his current regimen with the hope for improvement of his renal function.  He is status post 5 cycles of the new regimen.  He has been tolerating this treatment fairly well except for the fatigue. Assessment and Plan    Multiple Myeloma Diagnosed in August 2024 with significant renal insufficiency. Currently on the sixth cycle of treatment with subcutaneous daratumumab, Velcade, Revlimid, and Decadron. Reports fatigue and soreness, likely related to both dialysis and treatment. Hemoglobin level is 8.6, indicating no need for transfusion. Multiple myeloma remains incurable, but treatment options manage the disease and prolong life, with  improved survival rates due to advancements. - Administer full treatment today with daratumumab, Velcade, Revlimid, and Decadron. - Perform full blood work on the third week to assess disease status.  Renal Failure Secondary to Multiple Myeloma Managed with hemodialysis on Tuesday, Thursday, and Saturday. Kidney transplant is not feasible unless cancer-free, which is unattainable with multiple myeloma. A new kidney would be at risk of damage from the ongoing disease. - Continue hemodialysis on Tuesday, Thursday, and Saturday.   He was advised to call immediately if he has any concerning symptoms in the interval. The patient voices understanding of current disease status and treatment options and is in agreement with the current care plan.  All questions were answered. The patient knows to call the clinic with any problems, questions or concerns. We can certainly see the patient much sooner if necessary.  The total time spent in the appointment was 20 minutes.  Disclaimer: This note was dictated with voice recognition software. Similar sounding words can inadvertently be transcribed and may not be corrected upon review.

## 2023-06-23 ENCOUNTER — Encounter: Payer: Self-pay | Admitting: Internal Medicine

## 2023-06-23 ENCOUNTER — Other Ambulatory Visit: Payer: Self-pay

## 2023-06-29 ENCOUNTER — Inpatient Hospital Stay: Payer: Self-pay

## 2023-06-29 VITALS — BP 150/79 | HR 75 | Temp 97.8°F | Resp 16 | Wt 184.5 lb

## 2023-06-29 DIAGNOSIS — Z5112 Encounter for antineoplastic immunotherapy: Secondary | ICD-10-CM | POA: Diagnosis not present

## 2023-06-29 DIAGNOSIS — C9000 Multiple myeloma not having achieved remission: Secondary | ICD-10-CM

## 2023-06-29 LAB — CBC WITH DIFFERENTIAL (CANCER CENTER ONLY)
Abs Immature Granulocytes: 0.06 10*3/uL (ref 0.00–0.07)
Basophils Absolute: 0.1 10*3/uL (ref 0.0–0.1)
Basophils Relative: 2 %
Eosinophils Absolute: 0.1 10*3/uL (ref 0.0–0.5)
Eosinophils Relative: 2 %
HCT: 25.4 % — ABNORMAL LOW (ref 39.0–52.0)
Hemoglobin: 8.6 g/dL — ABNORMAL LOW (ref 13.0–17.0)
Immature Granulocytes: 1 %
Lymphocytes Relative: 7 %
Lymphs Abs: 0.4 10*3/uL — ABNORMAL LOW (ref 0.7–4.0)
MCH: 32.7 pg (ref 26.0–34.0)
MCHC: 33.9 g/dL (ref 30.0–36.0)
MCV: 96.6 fL (ref 80.0–100.0)
Monocytes Absolute: 0.4 10*3/uL (ref 0.1–1.0)
Monocytes Relative: 7 %
Neutro Abs: 4.1 10*3/uL (ref 1.7–7.7)
Neutrophils Relative %: 81 %
Platelet Count: 132 10*3/uL — ABNORMAL LOW (ref 150–400)
RBC: 2.63 MIL/uL — ABNORMAL LOW (ref 4.22–5.81)
RDW: 17.2 % — ABNORMAL HIGH (ref 11.5–15.5)
WBC Count: 5.1 10*3/uL (ref 4.0–10.5)
nRBC: 0 % (ref 0.0–0.2)

## 2023-06-29 LAB — CMP (CANCER CENTER ONLY)
ALT: 9 U/L (ref 0–44)
AST: 16 U/L (ref 15–41)
Albumin: 4.2 g/dL (ref 3.5–5.0)
Alkaline Phosphatase: 73 U/L (ref 38–126)
Anion gap: 10 (ref 5–15)
BUN: 49 mg/dL — ABNORMAL HIGH (ref 6–20)
CO2: 26 mmol/L (ref 22–32)
Calcium: 9.3 mg/dL (ref 8.9–10.3)
Chloride: 99 mmol/L (ref 98–111)
Creatinine: 6.3 mg/dL — ABNORMAL HIGH (ref 0.61–1.24)
GFR, Estimated: 10 mL/min — ABNORMAL LOW (ref >60)
Glucose, Bld: 85 mg/dL (ref 70–99)
Potassium: 4.5 mmol/L (ref 3.5–5.1)
Sodium: 135 mmol/L (ref 135–145)
Total Bilirubin: 0.3 mg/dL (ref 0.0–1.2)
Total Protein: 6.5 g/dL (ref 6.5–8.1)

## 2023-06-29 MED ORDER — SODIUM CHLORIDE 0.9 % IV SOLN
500.0000 mg/m2 | Freq: Once | INTRAVENOUS | Status: AC
  Administered 2023-06-29: 1000 mg via INTRAVENOUS
  Filled 2023-06-29: qty 50

## 2023-06-29 MED ORDER — BORTEZOMIB CHEMO SQ INJECTION 3.5 MG (2.5MG/ML)
1.5000 mg/m2 | Freq: Once | INTRAMUSCULAR | Status: AC
  Administered 2023-06-29: 3 mg via SUBCUTANEOUS
  Filled 2023-06-29: qty 1.2

## 2023-06-29 MED ORDER — DEXAMETHASONE 4 MG PO TABS
40.0000 mg | ORAL_TABLET | Freq: Once | ORAL | Status: AC
  Administered 2023-06-29: 40 mg via ORAL
  Filled 2023-06-29: qty 10

## 2023-06-29 MED ORDER — PALONOSETRON HCL INJECTION 0.25 MG/5ML
0.2500 mg | Freq: Once | INTRAVENOUS | Status: AC
  Administered 2023-06-29: 0.25 mg via INTRAVENOUS
  Filled 2023-06-29: qty 5

## 2023-06-29 NOTE — Patient Instructions (Signed)
 [facility] - A DEPT OF [facility]  Discharge Instructions: Thank you for choosing [facility] to provide your oncology and hematology care.   If you have a lab appointment with the Cancer Center, please go directly to the Cancer Center and check in at the registration area.   Wear comfortable clothing and clothing appropriate for easy access to any Portacath or PICC line.   We strive to give you quality time with your provider. You may need to reschedule your appointment if you arrive late (15 or more minutes).  Arriving late affects you and other patients whose appointments are after yours.  Also, if you miss three or more appointments without notifying the office, you may be dismissed from the clinic at the provider's discretion.      For prescription refill requests, have your pharmacy contact our office and allow 72 hours for refills to be completed.    Today you received the following chemotherapy and/or immunotherapy agents velcade, cytoxan      To help prevent nausea and vomiting after your treatment, we encourage you to take your nausea medication as directed.  BELOW ARE SYMPTOMS THAT SHOULD BE REPORTED IMMEDIATELY: *FEVER GREATER THAN 100.4 F (38 C) OR HIGHER *CHILLS OR SWEATING *NAUSEA AND VOMITING THAT IS NOT CONTROLLED WITH YOUR NAUSEA MEDICATION *UNUSUAL SHORTNESS OF BREATH *UNUSUAL BRUISING OR BLEEDING *URINARY PROBLEMS (pain or burning when urinating, or frequent urination) *BOWEL PROBLEMS (unusual diarrhea, constipation, pain near the anus) TENDERNESS IN MOUTH AND THROAT WITH OR WITHOUT PRESENCE OF ULCERS (sore throat, sores in mouth, or a toothache) UNUSUAL RASH, SWELLING OR PAIN  UNUSUAL VAGINAL DISCHARGE OR ITCHING   Items with * indicate a potential emergency and should be followed up as soon as possible or go to the Emergency Department if any problems should occur.  Please show the CHEMOTHERAPY ALERT CARD or  IMMUNOTHERAPY ALERT CARD at check-in to the Emergency Department and triage nurse.  Should you have questions after your visit or need to cancel or reschedule your appointment, please contact [facility] A DEPT [facility]  Dept: [phone]  and follow the prompts.  Office hours are 8:00 a.m. to 4:30 p.m. Monday - Friday. Please note that voicemails left after 4:00 p.m. may not be returned until the following business day.  We are closed weekends and major holidays. You have access to a nurse at all times for urgent questions. Please call the main number to the clinic Dept: [phone] and follow the prompts.   For any non-urgent questions, you may also contact your provider using MyChart. We now offer e-Visits for anyone 75 and older to request care online for non-urgent symptoms. For details visit [email or web address].   Also download the MyChart app! Go to the app store, search "MyChart", open the app, select [place], and log in with your MyChart username and password.

## 2023-06-29 NOTE — Progress Notes (Signed)
 Per Dr. Arbutus Ped , it is ok to treat pt today with velcade and cytoxan and creatinine - 6.30

## 2023-07-06 ENCOUNTER — Other Ambulatory Visit: Payer: Self-pay

## 2023-07-06 ENCOUNTER — Encounter: Payer: Self-pay | Admitting: Medical Oncology

## 2023-07-06 ENCOUNTER — Inpatient Hospital Stay: Payer: Self-pay | Attending: Internal Medicine

## 2023-07-06 ENCOUNTER — Other Ambulatory Visit: Payer: Self-pay | Admitting: Medical Oncology

## 2023-07-06 ENCOUNTER — Inpatient Hospital Stay

## 2023-07-06 ENCOUNTER — Encounter: Payer: Self-pay | Admitting: Internal Medicine

## 2023-07-06 ENCOUNTER — Inpatient Hospital Stay: Payer: Self-pay

## 2023-07-06 ENCOUNTER — Ambulatory Visit: Payer: Self-pay | Admitting: Internal Medicine

## 2023-07-06 VITALS — BP 134/68 | HR 80 | Temp 97.9°F | Resp 17 | Wt 183.2 lb

## 2023-07-06 DIAGNOSIS — D631 Anemia in chronic kidney disease: Secondary | ICD-10-CM

## 2023-07-06 DIAGNOSIS — Z992 Dependence on renal dialysis: Secondary | ICD-10-CM | POA: Diagnosis not present

## 2023-07-06 DIAGNOSIS — C9000 Multiple myeloma not having achieved remission: Secondary | ICD-10-CM

## 2023-07-06 DIAGNOSIS — D649 Anemia, unspecified: Secondary | ICD-10-CM

## 2023-07-06 DIAGNOSIS — Z5112 Encounter for antineoplastic immunotherapy: Secondary | ICD-10-CM | POA: Diagnosis present

## 2023-07-06 DIAGNOSIS — Z5111 Encounter for antineoplastic chemotherapy: Secondary | ICD-10-CM | POA: Insufficient documentation

## 2023-07-06 DIAGNOSIS — N185 Chronic kidney disease, stage 5: Secondary | ICD-10-CM | POA: Diagnosis not present

## 2023-07-06 LAB — CBC WITH DIFFERENTIAL (CANCER CENTER ONLY)
Abs Immature Granulocytes: 0.01 10*3/uL (ref 0.00–0.07)
Basophils Absolute: 0.1 10*3/uL (ref 0.0–0.1)
Basophils Relative: 2 %
Eosinophils Absolute: 0.1 10*3/uL (ref 0.0–0.5)
Eosinophils Relative: 4 %
HCT: 20.6 % — ABNORMAL LOW (ref 39.0–52.0)
Hemoglobin: 7.1 g/dL — ABNORMAL LOW (ref 13.0–17.0)
Immature Granulocytes: 0 %
Lymphocytes Relative: 12 %
Lymphs Abs: 0.4 10*3/uL — ABNORMAL LOW (ref 0.7–4.0)
MCH: 33 pg (ref 26.0–34.0)
MCHC: 34.5 g/dL (ref 30.0–36.0)
MCV: 95.8 fL (ref 80.0–100.0)
Monocytes Absolute: 0.3 10*3/uL (ref 0.1–1.0)
Monocytes Relative: 10 %
Neutro Abs: 2.5 10*3/uL (ref 1.7–7.7)
Neutrophils Relative %: 72 %
Platelet Count: 119 10*3/uL — ABNORMAL LOW (ref 150–400)
RBC: 2.15 MIL/uL — ABNORMAL LOW (ref 4.22–5.81)
RDW: 17.1 % — ABNORMAL HIGH (ref 11.5–15.5)
WBC Count: 3.4 10*3/uL — ABNORMAL LOW (ref 4.0–10.5)
nRBC: 0 % (ref 0.0–0.2)

## 2023-07-06 LAB — CMP (CANCER CENTER ONLY)
ALT: 7 U/L (ref 0–44)
AST: 15 U/L (ref 15–41)
Albumin: 3.9 g/dL (ref 3.5–5.0)
Alkaline Phosphatase: 67 U/L (ref 38–126)
Anion gap: 9 (ref 5–15)
BUN: 62 mg/dL — ABNORMAL HIGH (ref 6–20)
CO2: 28 mmol/L (ref 22–32)
Calcium: 8.8 mg/dL — ABNORMAL LOW (ref 8.9–10.3)
Chloride: 100 mmol/L (ref 98–111)
Creatinine: 7.73 mg/dL (ref 0.61–1.24)
GFR, Estimated: 7 mL/min — ABNORMAL LOW (ref >60)
Glucose, Bld: 91 mg/dL (ref 70–99)
Potassium: 3.8 mmol/L (ref 3.5–5.1)
Sodium: 137 mmol/L (ref 135–145)
Total Bilirubin: 0.3 mg/dL (ref 0.0–1.2)
Total Protein: 6.1 g/dL — ABNORMAL LOW (ref 6.5–8.1)

## 2023-07-06 LAB — SAMPLE TO BLOOD BANK

## 2023-07-06 LAB — PREPARE RBC (CROSSMATCH)

## 2023-07-06 MED ORDER — DIPHENHYDRAMINE HCL 25 MG PO CAPS
50.0000 mg | ORAL_CAPSULE | Freq: Once | ORAL | Status: AC
  Administered 2023-07-06: 50 mg via ORAL
  Filled 2023-07-06: qty 2

## 2023-07-06 MED ORDER — BORTEZOMIB CHEMO SQ INJECTION 3.5 MG (2.5MG/ML)
1.5000 mg/m2 | Freq: Once | INTRAMUSCULAR | Status: AC
  Administered 2023-07-06: 3 mg via SUBCUTANEOUS
  Filled 2023-07-06: qty 1.2

## 2023-07-06 MED ORDER — ACETAMINOPHEN 325 MG PO TABS
650.0000 mg | ORAL_TABLET | Freq: Once | ORAL | Status: AC
  Administered 2023-07-06: 650 mg via ORAL
  Filled 2023-07-06: qty 2

## 2023-07-06 MED ORDER — DEXAMETHASONE 4 MG PO TABS
40.0000 mg | ORAL_TABLET | Freq: Once | ORAL | Status: AC
  Administered 2023-07-06: 40 mg via ORAL
  Filled 2023-07-06: qty 10

## 2023-07-06 MED ORDER — PALONOSETRON HCL INJECTION 0.25 MG/5ML
0.2500 mg | Freq: Once | INTRAVENOUS | Status: AC
  Administered 2023-07-06: 0.25 mg via INTRAVENOUS
  Filled 2023-07-06: qty 5

## 2023-07-06 MED ORDER — SODIUM CHLORIDE 0.9 % IV SOLN: INTRAVENOUS | Status: DC

## 2023-07-06 MED ORDER — SODIUM CHLORIDE 0.9 % IV SOLN
500.0000 mg/m2 | Freq: Once | INTRAVENOUS | Status: AC
  Administered 2023-07-06: 1000 mg via INTRAVENOUS
  Filled 2023-07-06: qty 50

## 2023-07-06 MED ORDER — DARATUMUMAB-HYALURONIDASE-FIHJ 1800-30000 MG-UT/15ML ~~LOC~~ SOLN
1800.0000 mg | Freq: Once | SUBCUTANEOUS | Status: AC
  Administered 2023-07-06: 1800 mg via SUBCUTANEOUS
  Filled 2023-07-06: qty 15

## 2023-07-06 NOTE — Progress Notes (Signed)
 Spoke with Rene Kocher in the blood bank to confirm orders.  She said if patient needs another tube drew due to antibodies, then they will call our office in the morning.

## 2023-07-06 NOTE — Progress Notes (Unsigned)
 Per Dr. Arbutus Ped , it is ok to treat pt today with Cytoxan , velcade and darzelex and creatinine =7.3 and hgb 7.1. Blood transfusion ordered.

## 2023-07-07 LAB — IGG, IGA, IGM
IgA: 57 mg/dL — ABNORMAL LOW (ref 90–386)
IgG (Immunoglobin G), Serum: 420 mg/dL — ABNORMAL LOW (ref 603–1613)
IgM (Immunoglobulin M), Srm: 20 mg/dL (ref 20–172)

## 2023-07-07 LAB — KAPPA/LAMBDA LIGHT CHAINS
Kappa free light chain: 30.5 mg/L — ABNORMAL HIGH (ref 3.3–19.4)
Kappa, lambda light chain ratio: 1.4 (ref 0.26–1.65)
Lambda free light chains: 21.8 mg/L (ref 5.7–26.3)

## 2023-07-07 LAB — BETA 2 MICROGLOBULIN, SERUM: Beta-2 Microglobulin: 8.4 mg/L — ABNORMAL HIGH (ref 0.6–2.4)

## 2023-07-08 ENCOUNTER — Inpatient Hospital Stay

## 2023-07-08 DIAGNOSIS — D631 Anemia in chronic kidney disease: Secondary | ICD-10-CM

## 2023-07-08 DIAGNOSIS — Z5112 Encounter for antineoplastic immunotherapy: Secondary | ICD-10-CM | POA: Diagnosis not present

## 2023-07-08 MED ORDER — ACETAMINOPHEN 325 MG PO TABS
650.0000 mg | ORAL_TABLET | Freq: Once | ORAL | Status: AC
  Administered 2023-07-08: 650 mg via ORAL
  Filled 2023-07-08: qty 2

## 2023-07-08 MED ORDER — SODIUM CHLORIDE 0.9% IV SOLUTION
250.0000 mL | INTRAVENOUS | Status: DC
  Administered 2023-07-08: 100 mL via INTRAVENOUS

## 2023-07-08 MED ORDER — DIPHENHYDRAMINE HCL 25 MG PO CAPS
25.0000 mg | ORAL_CAPSULE | Freq: Once | ORAL | Status: AC
  Administered 2023-07-08: 25 mg via ORAL
  Filled 2023-07-08: qty 1

## 2023-07-08 NOTE — Patient Instructions (Signed)

## 2023-07-09 LAB — TYPE AND SCREEN
ABO/RH(D): AB POS
Antibody Screen: POSITIVE
Donor AG Type: NEGATIVE
Unit division: 0

## 2023-07-09 LAB — BPAM RBC
Blood Product Expiration Date: 202505032359
ISSUE DATE / TIME: 202504091224
Unit Type and Rh: 202505032359
Unit Type and Rh: 9500

## 2023-07-13 ENCOUNTER — Other Ambulatory Visit: Payer: Self-pay | Admitting: Physician Assistant

## 2023-07-13 DIAGNOSIS — C9000 Multiple myeloma not having achieved remission: Secondary | ICD-10-CM

## 2023-07-20 ENCOUNTER — Telehealth: Payer: Self-pay | Admitting: Medical Oncology

## 2023-07-20 ENCOUNTER — Encounter: Payer: Self-pay | Admitting: Medical Oncology

## 2023-07-20 ENCOUNTER — Other Ambulatory Visit: Payer: Self-pay | Admitting: Medical Oncology

## 2023-07-20 ENCOUNTER — Inpatient Hospital Stay: Payer: Medicaid Other

## 2023-07-20 VITALS — BP 133/76 | HR 82 | Temp 98.5°F | Resp 15 | Wt 183.5 lb

## 2023-07-20 DIAGNOSIS — C9000 Multiple myeloma not having achieved remission: Secondary | ICD-10-CM

## 2023-07-20 DIAGNOSIS — D631 Anemia in chronic kidney disease: Secondary | ICD-10-CM

## 2023-07-20 DIAGNOSIS — Z5112 Encounter for antineoplastic immunotherapy: Secondary | ICD-10-CM | POA: Diagnosis not present

## 2023-07-20 LAB — CMP (CANCER CENTER ONLY)
ALT: 10 U/L (ref 0–44)
AST: 17 U/L (ref 15–41)
Albumin: 3.8 g/dL (ref 3.5–5.0)
Alkaline Phosphatase: 47 U/L (ref 38–126)
Anion gap: 8 (ref 5–15)
BUN: 40 mg/dL — ABNORMAL HIGH (ref 6–20)
CO2: 28 mmol/L (ref 22–32)
Calcium: 9.4 mg/dL (ref 8.9–10.3)
Chloride: 101 mmol/L (ref 98–111)
Creatinine: 6.75 mg/dL — ABNORMAL HIGH (ref 0.61–1.24)
GFR, Estimated: 9 mL/min — ABNORMAL LOW (ref >60)
Glucose, Bld: 89 mg/dL (ref 70–99)
Potassium: 4 mmol/L (ref 3.5–5.1)
Sodium: 137 mmol/L (ref 135–145)
Total Bilirubin: 0.3 mg/dL (ref 0.0–1.2)
Total Protein: 5.9 g/dL — ABNORMAL LOW (ref 6.5–8.1)

## 2023-07-20 LAB — CBC WITH DIFFERENTIAL (CANCER CENTER ONLY)
Abs Immature Granulocytes: 0.02 10*3/uL (ref 0.00–0.07)
Basophils Absolute: 0.1 10*3/uL (ref 0.0–0.1)
Basophils Relative: 2 %
Eosinophils Absolute: 0.1 10*3/uL (ref 0.0–0.5)
Eosinophils Relative: 4 %
HCT: 20.2 % — ABNORMAL LOW (ref 39.0–52.0)
Hemoglobin: 6.8 g/dL — CL (ref 13.0–17.0)
Immature Granulocytes: 1 %
Lymphocytes Relative: 11 %
Lymphs Abs: 0.4 10*3/uL — ABNORMAL LOW (ref 0.7–4.0)
MCH: 32.1 pg (ref 26.0–34.0)
MCHC: 33.7 g/dL (ref 30.0–36.0)
MCV: 95.3 fL (ref 80.0–100.0)
Monocytes Absolute: 0.5 10*3/uL (ref 0.1–1.0)
Monocytes Relative: 12 %
Neutro Abs: 2.6 10*3/uL (ref 1.7–7.7)
Neutrophils Relative %: 70 %
Platelet Count: 129 10*3/uL — ABNORMAL LOW (ref 150–400)
RBC: 2.12 MIL/uL — ABNORMAL LOW (ref 4.22–5.81)
RDW: 19.9 % — ABNORMAL HIGH (ref 11.5–15.5)
WBC Count: 3.7 10*3/uL — ABNORMAL LOW (ref 4.0–10.5)
nRBC: 0 % (ref 0.0–0.2)

## 2023-07-20 LAB — SAMPLE TO BLOOD BANK

## 2023-07-20 LAB — PREPARE RBC (CROSSMATCH)

## 2023-07-20 MED ORDER — DARATUMUMAB-HYALURONIDASE-FIHJ 1800-30000 MG-UT/15ML ~~LOC~~ SOLN
1800.0000 mg | Freq: Once | SUBCUTANEOUS | Status: AC
  Administered 2023-07-20: 1800 mg via SUBCUTANEOUS
  Filled 2023-07-20: qty 15

## 2023-07-20 MED ORDER — BORTEZOMIB CHEMO SQ INJECTION 3.5 MG (2.5MG/ML)
1.5000 mg/m2 | Freq: Once | INTRAMUSCULAR | Status: AC
  Administered 2023-07-20: 3 mg via SUBCUTANEOUS
  Filled 2023-07-20: qty 1.2

## 2023-07-20 MED ORDER — SODIUM CHLORIDE 0.9 % IV SOLN
500.0000 mg/m2 | Freq: Once | INTRAVENOUS | Status: AC
  Administered 2023-07-20: 1000 mg via INTRAVENOUS
  Filled 2023-07-20: qty 50

## 2023-07-20 MED ORDER — ACETAMINOPHEN 325 MG PO TABS
650.0000 mg | ORAL_TABLET | Freq: Once | ORAL | Status: AC
  Administered 2023-07-20: 650 mg via ORAL
  Filled 2023-07-20: qty 2

## 2023-07-20 MED ORDER — PALONOSETRON HCL INJECTION 0.25 MG/5ML
0.2500 mg | Freq: Once | INTRAVENOUS | Status: AC
  Administered 2023-07-20: 0.25 mg via INTRAVENOUS
  Filled 2023-07-20: qty 5

## 2023-07-20 MED ORDER — DIPHENHYDRAMINE HCL 25 MG PO CAPS
50.0000 mg | ORAL_CAPSULE | Freq: Once | ORAL | Status: AC
  Administered 2023-07-20: 50 mg via ORAL
  Filled 2023-07-20: qty 2

## 2023-07-20 MED ORDER — DEXAMETHASONE 4 MG PO TABS: 40.0000 mg | ORAL_TABLET | Freq: Once | ORAL | Status: DC

## 2023-07-20 MED ORDER — SODIUM CHLORIDE 0.9 % IV SOLN: INTRAVENOUS | Status: DC

## 2023-07-20 NOTE — Progress Notes (Addendum)
 Per [name] , it is ok to treat pt today with Daratumumab  , Cytoxan  and velcade  and hgb 6.8  and creatinine =6.75.

## 2023-07-20 NOTE — Progress Notes (Signed)
 Patient states he took dexamethasone  prior to arrival today.  Per [name], OK to treat with Cr 6.75.

## 2023-07-20 NOTE — Telephone Encounter (Signed)
 CRITICAL VALUE STICKER   CRITICAL VALUE: hgb 6.8  RECEIVER (on-site recipient of call):Torren Maffeo, RN  DATE & TIME NOTIFIED: 07/20/2023 @ 1400  MESSENGER (representative from lab):Trenia Fritter  MD NOTIFIED: Marguerita Shih  TIME OF NOTIFICATION:1404  RESPONSE:  ordered 2 units of blood .

## 2023-07-20 NOTE — Progress Notes (Signed)
Blood transfusion orders entered.

## 2023-07-20 NOTE — Patient Instructions (Signed)
 [facility] - A DEPT OF [facility]  Discharge Instructions: Thank you for choosing [facility] to provide your oncology and hematology care.   If you have a lab appointment with the Cancer Center, please go directly to the Cancer Center and check in at the registration area.   Wear comfortable clothing and clothing appropriate for easy access to any Portacath or PICC line.   We strive to give you quality time with your provider. You may need to reschedule your appointment if you arrive late (15 or more minutes).  Arriving late affects you and other patients whose appointments are after yours.  Also, if you miss three or more appointments without notifying the office, you may be dismissed from the clinic at the provider's discretion.      For prescription refill requests, have your pharmacy contact our office and allow 72 hours for refills to be completed.    Today you received the following chemotherapy and/or immunotherapy agents: Cyclophosphamide , Velcade , Darzalex  Faspro      To help prevent nausea and vomiting after your treatment, we encourage you to take your nausea medication as directed.  BELOW ARE SYMPTOMS THAT SHOULD BE REPORTED IMMEDIATELY: *FEVER GREATER THAN 100.4 F (38 C) OR HIGHER *CHILLS OR SWEATING *NAUSEA AND VOMITING THAT IS NOT CONTROLLED WITH YOUR NAUSEA MEDICATION *UNUSUAL SHORTNESS OF BREATH *UNUSUAL BRUISING OR BLEEDING *URINARY PROBLEMS (pain or burning when urinating, or frequent urination) *BOWEL PROBLEMS (unusual diarrhea, constipation, pain near the anus) TENDERNESS IN MOUTH AND THROAT WITH OR WITHOUT PRESENCE OF ULCERS (sore throat, sores in mouth, or a toothache) UNUSUAL RASH, SWELLING OR PAIN  UNUSUAL VAGINAL DISCHARGE OR ITCHING   Items with * indicate a potential emergency and should be followed up as soon as possible or go to the Emergency Department if any problems should occur.  Please show the  CHEMOTHERAPY ALERT CARD or IMMUNOTHERAPY ALERT CARD at check-in to the Emergency Department and triage nurse.  Should you have questions after your visit or need to cancel or reschedule your appointment, please contact [facility] - A DEPT OF [facility]  Dept: [phone]  and follow the prompts.  Office hours are 8:00 a.m. to 4:30 p.m. Monday - Friday. Please note that voicemails left after 4:00 p.m. may not be returned until the following business day.  We are closed weekends and major holidays. You have access to a nurse at all times for urgent questions. Please call the main number to the clinic Dept: [phone] and follow the prompts.   For any non-urgent questions, you may also contact your provider using MyChart. We now offer e-Visits for anyone 50 and older to request care online for non-urgent symptoms. For details visit [email or web address].   Also download the MyChart app! Go to the app store, search "MyChart", open the app, select [place], and log in with your MyChart username and password.

## 2023-07-20 NOTE — Progress Notes (Signed)
 T&S and prepare released.

## 2023-07-22 ENCOUNTER — Inpatient Hospital Stay

## 2023-07-22 DIAGNOSIS — Z5112 Encounter for antineoplastic immunotherapy: Secondary | ICD-10-CM | POA: Diagnosis not present

## 2023-07-22 DIAGNOSIS — D631 Anemia in chronic kidney disease: Secondary | ICD-10-CM

## 2023-07-22 MED ORDER — SODIUM CHLORIDE 0.9% IV SOLUTION
250.0000 mL | INTRAVENOUS | Status: DC
  Administered 2023-07-22: 100 mL via INTRAVENOUS

## 2023-07-22 MED ORDER — ACETAMINOPHEN 325 MG PO TABS
650.0000 mg | ORAL_TABLET | Freq: Once | ORAL | Status: AC
  Administered 2023-07-22: 650 mg via ORAL
  Filled 2023-07-22: qty 2

## 2023-07-22 MED ORDER — DIPHENHYDRAMINE HCL 25 MG PO CAPS
25.0000 mg | ORAL_CAPSULE | Freq: Once | ORAL | Status: AC
  Administered 2023-07-22: 25 mg via ORAL
  Filled 2023-07-22: qty 1

## 2023-07-22 NOTE — Patient Instructions (Signed)

## 2023-07-23 LAB — BPAM RBC
Blood Product Expiration Date: 202505212359
Blood Product Unit Number: 202505212359
ISSUE DATE / TIME: 202504231130
PRODUCT CODE: 202504231130
PRODUCT CODE: 202505212359
Unit Type and Rh: 5100
Unit Type and Rh: 202505212359
Unit Type and Rh: 5100
Unit Type and Rh: 5100

## 2023-07-23 LAB — TYPE AND SCREEN
ABO/RH(D): AB POS
Antibody Screen: POSITIVE
Unit division: 0
Unit division: 0

## 2023-07-27 ENCOUNTER — Inpatient Hospital Stay: Payer: Medicaid Other

## 2023-07-27 ENCOUNTER — Inpatient Hospital Stay (HOSPITAL_BASED_OUTPATIENT_CLINIC_OR_DEPARTMENT_OTHER): Payer: Medicaid Other | Admitting: Internal Medicine

## 2023-07-27 ENCOUNTER — Encounter: Payer: Self-pay | Admitting: Medical Oncology

## 2023-07-27 VITALS — BP 137/79 | HR 73 | Temp 97.8°F | Resp 17 | Ht 70.0 in | Wt 184.9 lb

## 2023-07-27 DIAGNOSIS — C9000 Multiple myeloma not having achieved remission: Secondary | ICD-10-CM

## 2023-07-27 DIAGNOSIS — Z5112 Encounter for antineoplastic immunotherapy: Secondary | ICD-10-CM | POA: Diagnosis not present

## 2023-07-27 DIAGNOSIS — D631 Anemia in chronic kidney disease: Secondary | ICD-10-CM

## 2023-07-27 LAB — CBC WITH DIFFERENTIAL (CANCER CENTER ONLY)
Abs Immature Granulocytes: 0.05 10*3/uL (ref 0.00–0.07)
Basophils Absolute: 0.1 10*3/uL (ref 0.0–0.1)
Basophils Relative: 2 %
Eosinophils Absolute: 0.2 10*3/uL (ref 0.0–0.5)
Eosinophils Relative: 4 %
HCT: 26.2 % — ABNORMAL LOW (ref 39.0–52.0)
Hemoglobin: 8.9 g/dL — ABNORMAL LOW (ref 13.0–17.0)
Immature Granulocytes: 1 %
Lymphocytes Relative: 11 %
Lymphs Abs: 0.5 10*3/uL — ABNORMAL LOW (ref 0.7–4.0)
MCH: 31.8 pg (ref 26.0–34.0)
MCHC: 34 g/dL (ref 30.0–36.0)
MCV: 93.6 fL (ref 80.0–100.0)
Monocytes Absolute: 0.4 10*3/uL (ref 0.1–1.0)
Monocytes Relative: 10 %
Neutro Abs: 3.3 10*3/uL (ref 1.7–7.7)
Neutrophils Relative %: 72 %
Platelet Count: 122 10*3/uL — ABNORMAL LOW (ref 150–400)
RBC: 2.8 MIL/uL — ABNORMAL LOW (ref 4.22–5.81)
RDW: 18.6 % — ABNORMAL HIGH (ref 11.5–15.5)
WBC Count: 4.5 10*3/uL (ref 4.0–10.5)
nRBC: 0 % (ref 0.0–0.2)

## 2023-07-27 LAB — CMP (CANCER CENTER ONLY)
ALT: 9 U/L (ref 0–44)
AST: 14 U/L — ABNORMAL LOW (ref 15–41)
Albumin: 4 g/dL (ref 3.5–5.0)
Alkaline Phosphatase: 55 U/L (ref 38–126)
Anion gap: 11 (ref 5–15)
BUN: 59 mg/dL — ABNORMAL HIGH (ref 6–20)
CO2: 27 mmol/L (ref 22–32)
Calcium: 9.1 mg/dL (ref 8.9–10.3)
Chloride: 99 mmol/L (ref 98–111)
Creatinine: 6.75 mg/dL — ABNORMAL HIGH (ref 0.61–1.24)
GFR, Estimated: 9 mL/min — ABNORMAL LOW (ref >60)
Glucose, Bld: 96 mg/dL (ref 70–99)
Potassium: 4.2 mmol/L (ref 3.5–5.1)
Sodium: 137 mmol/L (ref 135–145)
Total Bilirubin: 0.4 mg/dL (ref 0.0–1.2)
Total Protein: 6.1 g/dL — ABNORMAL LOW (ref 6.5–8.1)

## 2023-07-27 LAB — SAMPLE TO BLOOD BANK

## 2023-07-27 MED ORDER — SODIUM CHLORIDE 0.9 % IV SOLN
500.0000 mg/m2 | Freq: Once | INTRAVENOUS | Status: AC
  Administered 2023-07-27: 1000 mg via INTRAVENOUS
  Filled 2023-07-27: qty 50

## 2023-07-27 MED ORDER — PALONOSETRON HCL INJECTION 0.25 MG/5ML
0.2500 mg | Freq: Once | INTRAVENOUS | Status: AC
  Administered 2023-07-27: 0.25 mg via INTRAVENOUS
  Filled 2023-07-27: qty 5

## 2023-07-27 MED ORDER — BORTEZOMIB CHEMO SQ INJECTION 3.5 MG (2.5MG/ML)
1.5000 mg/m2 | Freq: Once | INTRAMUSCULAR | Status: AC
  Administered 2023-07-27: 3 mg via SUBCUTANEOUS
  Filled 2023-07-27: qty 1.2

## 2023-07-27 NOTE — Progress Notes (Signed)
 Patient reports he took PO steroids prior to arrival.

## 2023-07-27 NOTE — Progress Notes (Signed)
 Per [name] ,it is ok to treat pt today with Cytoxan  and Velcade  and creatinine=6.75

## 2023-07-27 NOTE — Progress Notes (Signed)
 [name] [phone]   [phone]  OFFICE PROGRESS NOTE  [name], MD [address] [place] [address]  DIAGNOSIS: Recently diagnosed with multiple myeloma with lytic bone lesions in addition to renal insufficiency and bone marrow biopsy and aspirate consistent with plasma cell neoplasm with 53% plasma cells. This was diagnosed in August 2024.    PRIOR THERAPY:  Plasmapheresis status post ~4 treatments    CURRENT THERAPY: Systemic chemotherapy with subcutaneous Velcade  1.3 Mg/KG on days 1, 4, 8 and 11 every 3 weeks, cyclophosphamide  500 Mg/M2 IV on days 1 and 8 as well as Decadron  40 mg p.o. on weekly basis start with the first dose of Velcade .  He is status post 2 cycles.  Once his renal function improves, [name] will likely recommend changing treatment to standard treatment with daratumumab , Velcade , Revlimid and Decadron  for 4 cycles followed by evaluation for autologous stem cell transplant.  He is status post 5 cycles of the new regimen with daratumumab  subcutaneously, cyclophosphamide , subcutaneous Velcade  and dexamethasone  every 4 weeks   INTERVAL HISTORY: Drew Cooper 58 y.o. male returns to the clinic today for follow-up visit. Discussed the use of AI scribe software for clinical note transcription with the patient, who gave verbal consent to proceed.  History of Present Illness   Drew Cooper is a 58 year old male with mantle myeloma who presents for evaluation before starting day eight of cycle number seven of chemotherapy.  He was diagnosed with mantle myeloma in August 2024 and is currently undergoing treatment with daratumumab , cyclophosphamide , Velcade , and Decadron . He has completed six cycles and is here for evaluation before starting day eight of cycle number seven. No new issues or side effects from the chemotherapy are reported, and he states 'the last one was good.'  He is also undergoing  hemodialysis and sometimes feels 'kind of weak' after the sessions, but otherwise has no issues with the dialysis.  Recent laboratory results show significant improvement in his myeloma markers. The free lambda light chains, which were initially uncountable due to high levels, have decreased from 28,311.2 to 21.8. His hemoglobin has improved from 6.8 to 8.9, and his platelet count is 122,000. However, his kidney function remains elevated at 6.75 due to kidney failure.  He mentions trying to maintain a normal diet.        MEDICAL HISTORY: Past Medical History:  Diagnosis Date   Cancer (HCC) 10/2022   multiple myeloma   Chronic kidney disease     ALLERGIES:  has no known allergies.  MEDICATIONS:  Current Outpatient Medications  Medication Sig Dispense Refill   acetaminophen  (TYLENOL ) 500 MG tablet Take 500 mg by mouth every 6 (six) hours as needed for moderate pain.     acyclovir  (ZOVIRAX ) 200 MG capsule Take 1 capsule (200 mg total) by mouth 2 (two) times daily. 60 capsule 2   Calcium  Carbonate (CALCIUM  600 PO) Take 600 mg by mouth 2 (two) times daily.     DARZALEX  FASPRO 1800-30000 MG-UT/15ML SOLN Inject into the skin.     dexamethasone  (DECADRON ) 4 MG tablet Take 10 tablets by mouth once a week, skip 9/6 and take it 9/9 instead with treatment (Patient taking differently: Take 10 tablets by mouth once a week, skip 9/6 and take it 9/9 instead with treatment. PT STATES HE TOOK ON 12/05/22) 40 tablet 4   ferric citrate  (AURYXIA ) 1 GM 210 MG(Fe) tablet Take 2 tablets (420 mg total)  by mouth 3 (three) times daily with meals. (Patient not taking: Reported on 05/04/2023) 180 tablet 0   ferrous sulfate 325 (65 FE) MG tablet Take 325 mg by mouth daily with breakfast.     furosemide  (LASIX ) 40 MG tablet Take 40 mg by mouth every other day.     gabapentin  (NEURONTIN ) 100 MG capsule `T1C BY MOUTH THREE TIMES DAILY 90 capsule 1   oxyCODONE  (OXY IR/ROXICODONE ) 5 MG immediate release tablet Take 1 tablet  (5 mg total) by mouth every 6 (six) hours as needed for moderate pain (pain score 4-6) (for pain score of 1-4). (Patient not taking: Reported on 05/04/2023) 12 tablet 0   prochlorperazine  (COMPAZINE ) 10 MG tablet Take 1 tablet (10 mg total) by mouth every 6 (six) hours as needed. 30 tablet 2   sodium bicarbonate  650 MG tablet Take 1 tablet (650 mg total) by mouth 2 (two) times daily. 60 tablet 0   No current facility-administered medications for this visit.    SURGICAL HISTORY:  Past Surgical History:  Procedure Laterality Date   AV FISTULA PLACEMENT Left 03/26/2023   Procedure: LEFT ARM FISTULA CREATION;  Surgeon: [name], MD;  Location: [facility] OR;  Service: Vascular;  Laterality: Left;   IR FLUORO GUIDE CV LINE RIGHT  11/13/2022   IR US  GUIDE VASC ACCESS RIGHT  11/13/2022   KNEE SURGERY      REVIEW OF SYSTEMS:  Constitutional: positive for fatigue Eyes: negative Ears, nose, mouth, throat, and face: negative Respiratory: negative Cardiovascular: negative Gastrointestinal: negative Genitourinary:negative Integument/breast: negative Hematologic/lymphatic: negative Musculoskeletal:negative Neurological: negative Behavioral/Psych: negative Endocrine: negative Allergic/Immunologic: negative   PHYSICAL EXAMINATION: General appearance: alert, cooperative, fatigued, and no distress Head: Normocephalic, without obvious abnormality, atraumatic Neck: no adenopathy, no JVD, supple, symmetrical, trachea midline, and thyroid not enlarged, symmetric, no tenderness/mass/nodules Lymph nodes: Cervical, supraclavicular, and axillary nodes normal. Resp: clear to auscultation bilaterally Back: symmetric, no curvature. ROM normal. No CVA tenderness. Cardio: regular rate and rhythm, S1, S2 normal, no murmur, click, rub or gallop GI: soft, non-tender; bowel sounds normal; no masses,  no organomegaly Extremities: extremities normal, atraumatic, no cyanosis or edema Neurologic: Alert and oriented X  3, normal strength and tone. Normal symmetric reflexes. Normal coordination and gait  ECOG PERFORMANCE STATUS: 1 - Symptomatic but completely ambulatory  Blood pressure 137/79, pulse 73, temperature 97.8 F (36.6 C), temperature source Temporal, resp. rate 17, height 5\' 10"  (1.778 m), weight 184 lb 14.4 oz (83.9 kg), SpO2 100%.  LABORATORY DATA: Lab Results  Component Value Date   WBC 4.5 07/27/2023   HGB 8.9 (L) 07/27/2023   HCT 26.2 (L) 07/27/2023   MCV 93.6 07/27/2023   PLT 122 (L) 07/27/2023      Chemistry      Component Value Date/Time   NA 137 07/27/2023 0951   K 4.2 07/27/2023 0951   CL 99 07/27/2023 0951   CO2 27 07/27/2023 0951   BUN 59 (H) 07/27/2023 0951   CREATININE 6.75 (H) 07/27/2023 0951      Component Value Date/Time   CALCIUM  9.1 07/27/2023 0951   ALKPHOS 55 07/27/2023 0951   AST 14 (L) 07/27/2023 0951   ALT 9 07/27/2023 0951   BILITOT 0.4 07/27/2023 0951       RADIOGRAPHIC STUDIES: No results found.   ASSESSMENT AND PLAN: This is a very pleasant 58 years old white male with multiple myeloma with lytic bone lesions in addition to renal insufficiency and bone marrow biopsy and aspirate consistent with  plasma cell neoplasm with 53% plasma cells. This was diagnosed in August 2024. He is currently undergoing systemic chemotherapy with subcutaneous Velcade  1.3 Mg/KG on days 1, 4, 8 and 11 every 3 weeks, cyclophosphamide  500 Mg/M2 IV on days 1 and 8 as well as Decadron  40 mg p.o. on weekly basis start with the first dose of Velcade .  He is status post 3 cycles.  Unfortunately no improvement in his renal function so far He was referred to [name] at [facility] who recommended adding daratumumab  to his current regimen with the hope for improvement of his renal function.  He is status post 6 cycles of the new regimen.  He has been tolerating this treatment fairly well except for the fatigue.    Mantle cell myeloma Diagnosed in August 2024, currently  undergoing treatment with daratumumab , cyclophosphamide , Velcade , and Decadron . Significant improvement in myeloma markers, with free lambda light chains reduced from 28,311.2 to 21.8. Hemoglobin has improved to 8.9 from 6.8. Treatment is well-tolerated with no reported side effects. - Proceed with day 8 of cycle 7 of chemotherapy  Chronic kidney disease, stage 5 Stage 5 secondary to multiple myeloma. Kidney function remains impaired with creatinine at 6.75. Despite significant improvement in myeloma markers, kidney damage is irreversible. Hemodialysis is ongoing and well-tolerated, though there is occasional post-dialysis weakness. - Continue hemodialysis as scheduled - Monitor kidney function regularly  Dependence on renal dialysis Dependent on renal dialysis due to stage 5 chronic kidney disease. Dialysis is well-tolerated with occasional post-dialysis weakness reported. - Continue renal dialysis sessions as scheduled   The patient was advised to call immediately if he has any concerning symptoms in the interval.  The patient voices understanding of current disease status and treatment options and is in agreement with the current care plan.  All questions were answered. The patient knows to call the clinic with any problems, questions or concerns. We can certainly see the patient much sooner if necessary.  The total time spent in the appointment was 30 minutes.  Disclaimer: This note was dictated with voice recognition software. Similar sounding words can inadvertently be transcribed and may not be corrected upon review.

## 2023-08-03 ENCOUNTER — Encounter: Payer: Self-pay | Admitting: Internal Medicine

## 2023-08-03 ENCOUNTER — Inpatient Hospital Stay (HOSPITAL_BASED_OUTPATIENT_CLINIC_OR_DEPARTMENT_OTHER): Admitting: Physician Assistant

## 2023-08-03 ENCOUNTER — Telehealth: Payer: Self-pay

## 2023-08-03 ENCOUNTER — Inpatient Hospital Stay: Payer: Medicaid Other

## 2023-08-03 ENCOUNTER — Other Ambulatory Visit: Payer: Self-pay | Admitting: Physician Assistant

## 2023-08-03 ENCOUNTER — Inpatient Hospital Stay: Payer: Medicaid Other | Attending: Internal Medicine

## 2023-08-03 DIAGNOSIS — Z992 Dependence on renal dialysis: Secondary | ICD-10-CM | POA: Insufficient documentation

## 2023-08-03 DIAGNOSIS — D631 Anemia in chronic kidney disease: Secondary | ICD-10-CM | POA: Insufficient documentation

## 2023-08-03 DIAGNOSIS — C9000 Multiple myeloma not having achieved remission: Secondary | ICD-10-CM | POA: Insufficient documentation

## 2023-08-03 DIAGNOSIS — N185 Chronic kidney disease, stage 5: Secondary | ICD-10-CM | POA: Diagnosis not present

## 2023-08-03 DIAGNOSIS — N184 Chronic kidney disease, stage 4 (severe): Secondary | ICD-10-CM

## 2023-08-03 DIAGNOSIS — L089 Local infection of the skin and subcutaneous tissue, unspecified: Secondary | ICD-10-CM

## 2023-08-03 DIAGNOSIS — Z5112 Encounter for antineoplastic immunotherapy: Secondary | ICD-10-CM | POA: Diagnosis present

## 2023-08-03 DIAGNOSIS — Z5111 Encounter for antineoplastic chemotherapy: Secondary | ICD-10-CM | POA: Insufficient documentation

## 2023-08-03 DIAGNOSIS — D6481 Anemia due to antineoplastic chemotherapy: Secondary | ICD-10-CM

## 2023-08-03 LAB — CMP (CANCER CENTER ONLY)
ALT: 10 U/L (ref 0–44)
AST: 19 U/L (ref 15–41)
Albumin: 3.9 g/dL (ref 3.5–5.0)
Alkaline Phosphatase: 60 U/L (ref 38–126)
Anion gap: 10 (ref 5–15)
BUN: 56 mg/dL — ABNORMAL HIGH (ref 6–20)
CO2: 25 mmol/L (ref 22–32)
Calcium: 8.6 mg/dL — ABNORMAL LOW (ref 8.9–10.3)
Chloride: 96 mmol/L — ABNORMAL LOW (ref 98–111)
Creatinine: 6.53 mg/dL — ABNORMAL HIGH (ref 0.61–1.24)
GFR, Estimated: 9 mL/min — ABNORMAL LOW (ref >60)
Glucose, Bld: 87 mg/dL (ref 70–99)
Potassium: 4 mmol/L (ref 3.5–5.1)
Sodium: 131 mmol/L — ABNORMAL LOW (ref 135–145)
Total Bilirubin: 0.3 mg/dL (ref 0.0–1.2)
Total Protein: 6.2 g/dL — ABNORMAL LOW (ref 6.5–8.1)

## 2023-08-03 LAB — CBC WITH DIFFERENTIAL (CANCER CENTER ONLY)
Abs Immature Granulocytes: 0.04 10*3/uL (ref 0.00–0.07)
Basophils Absolute: 0.1 10*3/uL (ref 0.0–0.1)
Basophils Relative: 1 %
Eosinophils Absolute: 0.2 10*3/uL (ref 0.0–0.5)
Eosinophils Relative: 2 %
HCT: 22.7 % — ABNORMAL LOW (ref 39.0–52.0)
Hemoglobin: 7.7 g/dL — ABNORMAL LOW (ref 13.0–17.0)
Immature Granulocytes: 1 %
Lymphocytes Relative: 5 %
Lymphs Abs: 0.4 10*3/uL — ABNORMAL LOW (ref 0.7–4.0)
MCH: 31.6 pg (ref 26.0–34.0)
MCHC: 33.9 g/dL (ref 30.0–36.0)
MCV: 93 fL (ref 80.0–100.0)
Monocytes Absolute: 0.5 10*3/uL (ref 0.1–1.0)
Monocytes Relative: 7 %
Neutro Abs: 6.2 10*3/uL (ref 1.7–7.7)
Neutrophils Relative %: 84 %
Platelet Count: 108 10*3/uL — ABNORMAL LOW (ref 150–400)
RBC: 2.44 MIL/uL — ABNORMAL LOW (ref 4.22–5.81)
RDW: 18.6 % — ABNORMAL HIGH (ref 11.5–15.5)
WBC Count: 7.4 10*3/uL (ref 4.0–10.5)
nRBC: 0 % (ref 0.0–0.2)

## 2023-08-03 LAB — PREPARE RBC (CROSSMATCH)

## 2023-08-03 LAB — SAMPLE TO BLOOD BANK

## 2023-08-03 NOTE — Progress Notes (Signed)
 Took his steroids at 1400.  When talking to patient he mentioned that his hemodialysis site that had been pulled on Friday- hurts. It is red, warm, swollen. Communicated to [place], PA and [name]. Cassie came and looked at it and took a photo to [name]. All in agreement that the site looked infected. Treatment held for today and dialysis center contacted so they can give the patient antibiotics during his treatment. Patient aware. Patient also needs 1 unit of blood this week for hemoglobin of 7.7- appt. Made on Wednesday- patient aware.

## 2023-08-03 NOTE — Telephone Encounter (Signed)
 From previous note from Izella Marshal, RN-  called dialysis center and spoke with Adah Acron in regards to patient.  She states he has an appt tomorrow at 5.    Spoke with Destiny at [facility] and confirmed that removal of dialysis catheter was on 07/31/2023. Informed Destiny that patient has appt with dialysis center tomorrow and they can evaluate the site.

## 2023-08-03 NOTE — Progress Notes (Signed)
 The patient had hemodialysis on Friday 07/31/23. His site is red, warm, and swollen. He denies fevers. We will reach out to the dialysis center for management and consideration of preferred antibiotic therapy. Due to his infection, we will cancel treatment today and delay it by 1 week. We will arrange for 1 unit of blood this week due to Hbg of 7.7. We will plan for this on Wednesday 08/05/23. I reviewed the plan with [name] who is in agreement.

## 2023-08-04 ENCOUNTER — Encounter: Payer: Self-pay | Admitting: Internal Medicine

## 2023-08-04 ENCOUNTER — Other Ambulatory Visit: Payer: Self-pay | Admitting: Physician Assistant

## 2023-08-04 DIAGNOSIS — C9000 Multiple myeloma not having achieved remission: Secondary | ICD-10-CM

## 2023-08-05 ENCOUNTER — Other Ambulatory Visit: Payer: Self-pay

## 2023-08-05 ENCOUNTER — Inpatient Hospital Stay

## 2023-08-05 DIAGNOSIS — Z5112 Encounter for antineoplastic immunotherapy: Secondary | ICD-10-CM | POA: Diagnosis not present

## 2023-08-05 DIAGNOSIS — D631 Anemia in chronic kidney disease: Secondary | ICD-10-CM

## 2023-08-05 MED ORDER — DIPHENHYDRAMINE HCL 25 MG PO CAPS
25.0000 mg | ORAL_CAPSULE | Freq: Once | ORAL | Status: AC
  Administered 2023-08-05: 25 mg via ORAL
  Filled 2023-08-05: qty 1

## 2023-08-05 MED ORDER — ACETAMINOPHEN 325 MG PO TABS
650.0000 mg | ORAL_TABLET | Freq: Once | ORAL | Status: AC
  Administered 2023-08-05: 650 mg via ORAL
  Filled 2023-08-05: qty 2

## 2023-08-05 MED ORDER — SODIUM CHLORIDE 0.9% IV SOLUTION
250.0000 mL | INTRAVENOUS | Status: DC
  Administered 2023-08-05: 250 mL via INTRAVENOUS

## 2023-08-05 NOTE — Patient Instructions (Signed)
 Blood Transfusion, Adult A blood transfusion is a procedure in which you receive blood or a type of blood cell (blood component) through an IV. You may need a blood transfusion when you have a low blood count, which is a low number of any blood cell. This may result from a bleeding disorder, illness, injury, or surgery. The blood may come from a donor, or you may be able to have your own blood collected and stored (autologous blood donation) before a planned surgery. The blood given in a transfusion may be made up of different blood components. You may receive: Red blood cells. These carry oxygen to the cells in the body. Platelets. These help your blood to clot. Plasma. This is the liquid part of your blood. It carries proteins and other substances throughout the body. White blood cells. These help you fight infections. If you have hemophilia or another clotting disorder, you may also receive other types of blood products. Depending on the type of blood product, this procedure may take 1-4 hours to complete. Tell a health care provider about: Any bleeding problems you have. Any previous reactions you have had during a blood transfusion. Any allergies you have. All medicines you are taking, including vitamins, herbs, eye drops, creams, and over-the-counter medicines. Any surgeries you have had. Any medical conditions you have. Whether you are pregnant or may be pregnant. What are the risks? Talk with your health care provider about risks. The most common problems include: A mild allergic reaction, such as red, swollen areas of skin (hives) and itching. Fever or chills. This may be the body's response to new blood cells received. This may occur during or up to 4 hours after the transfusion. More serious problems may include: A serious allergic reaction that causes difficulty breathing or swelling around the face and lips. Transfusion-associated circulatory overload (TACO), or too much fluid in  the lungs. This may cause breathing problems. Transfusion-related acute lung injury (TRALI), which causes breathing difficulty and low oxygen in the blood. This can occur within hours of the transfusion or several days later. Iron overload. This can happen after receiving many blood transfusions over a period of time. Infection or virus being transmitted. This is rare because donated blood is carefully tested before it is given. Hemolytic transfusion reaction. This is rare. It happens when the body's defense system (immune system)tries to attack the new blood cells. Symptoms may include fever, chills, nausea, low blood pressure, and low back or chest pain. Transfusion-associated graft-versus-host disease (TAGVHD). This is rare. It happens when donated cells attack the body's healthy tissues. What happens before the procedure? You will have a blood test to check your blood type. This test is done to know what kind of blood your body will accept and to match it to the donor blood. If you are going to have a planned surgery, you may be able to do an autologous blood donation. This may be done in case you need to have a transfusion. You will have your temperature, blood pressure, and pulse checked before the transfusion. If you have had an allergic reaction to a transfusion in the past, you may be given medicine to help prevent a reaction. This medicine may be given to you by mouth (orally) or through an IV. What happens during the procedure?  An IV will be inserted into one of your veins. The bag of blood will be attached to your IV. The blood will then enter through your vein. Your temperature, blood pressure,  and pulse will be monitored during the transfusion. This monitoring is done to detect early signs of a transfusion reaction. Tell your nurse right away if you have any of these symptoms during the transfusion: Shortness of breath or trouble breathing. Chest or back pain. Fever or  chills. Itching or hives. If you have any signs or symptoms of a reaction, your transfusion will be stopped and you may be given medicine. When the transfusion is complete, your IV will be removed. Pressure may be applied to the IV site for a few minutes. A bandage (dressing)will be applied. The procedure may vary among health care providers and hospitals. What happens after the procedure? Your temperature, blood pressure, pulse, breathing rate, and blood oxygen level will be monitored until you leave the hospital or clinic. Your blood may be tested to see how you have responded to the transfusion. You may be warmed with fluids or blankets to maintain a normal body temperature. If you receive your blood transfusion in an outpatient setting, you will be told whom to contact to report any reactions. Where to find more information Visit the [employer]: redcross.org Summary A blood transfusion is a procedure in which you receive blood or a type of blood cell (blood component) through an IV. The blood given in a transfusion may be made up of different blood components. You may receive red blood cells, platelets, plasma, or white blood cells depending on the condition treated. Your temperature, blood pressure, and pulse will be monitored before, during, and after the transfusion. After the transfusion, your blood may be tested to see how your body has responded. This information is not intended to replace advice given to you by your health care provider. Make sure you discuss any questions you have with your health care provider. Document Revised: 06/14/2021 Document Reviewed: 06/14/2021 Elsevier Patient Education  2024 [employer].

## 2023-08-05 NOTE — Progress Notes (Signed)
 Site where hemodialysis cath removed is still red & irritated but pt is on antibiotics & will try some heat during the day & knows to call if worse.

## 2023-08-06 ENCOUNTER — Encounter: Payer: Self-pay | Admitting: Vascular Surgery

## 2023-08-06 ENCOUNTER — Ambulatory Visit: Attending: Vascular Surgery | Admitting: Vascular Surgery

## 2023-08-06 VITALS — BP 111/65 | HR 104 | Temp 98.6°F | Resp 18 | Ht 70.0 in | Wt 179.7 lb

## 2023-08-06 DIAGNOSIS — Z992 Dependence on renal dialysis: Secondary | ICD-10-CM | POA: Diagnosis not present

## 2023-08-06 DIAGNOSIS — N186 End stage renal disease: Secondary | ICD-10-CM | POA: Diagnosis not present

## 2023-08-06 DIAGNOSIS — L0211 Cutaneous abscess of neck: Secondary | ICD-10-CM | POA: Insufficient documentation

## 2023-08-06 LAB — TYPE AND SCREEN
ABO/RH(D): AB POS
Antibody Screen: POSITIVE
Donor AG Type: NEGATIVE
Unit division: 0
Unit division: 0

## 2023-08-06 LAB — BPAM RBC
Blood Product Expiration Date: 202506022359
ISSUE DATE / TIME: 202505282359
ISSUE DATE / TIME: 202505081016
PRODUCT CODE: 202505071451
Unit Type and Rh: 202506022359
Unit Type and Rh: 202506022359
Unit Type and Rh: 6200
Unit Type and Rh: 202505282359
Unit Type and Rh: 5100

## 2023-08-06 SURGERY — Surgical Case
Anesthesia: *Unknown

## 2023-08-06 NOTE — Progress Notes (Signed)
 [facility] [place]  ASSESSMENT / PLAN: 58 y.o. male with right neck abscess after TDC removal. Plan I&D in office today.   CHIEF COMPLAINT: abscess  HISTORY OF PRESENT ILLNESS: Drew Cooper is a 58 y.o. male with right neck abscess after TDC removal. He presented to his dialysis appointment today and underwent a treatment. His nephrologist called me to arrange for evaluation and I&D.  Past Medical History:  Diagnosis Date   Cancer (HCC) 10/2022   multiple myeloma   Chronic kidney disease     Past Surgical History:  Procedure Laterality Date   AV FISTULA PLACEMENT Left 03/26/2023   Procedure: LEFT ARM FISTULA CREATION;  Surgeon: [name], MD;  Location: [facility] OR;  Service: Vascular;  Laterality: Left;   IR FLUORO GUIDE CV LINE RIGHT  11/13/2022   IR US  GUIDE VASC ACCESS RIGHT  11/13/2022   KNEE SURGERY      Family History  Problem Relation Age of Onset   Hypertension Mother    Heart failure Mother    Diabetes Mother     Social History   Socioeconomic History   Marital status: Married    Spouse name: Not on file   Number of children: Not on file   Years of education: Not on file   Highest education level: Not on file  Occupational History   Not on file  Tobacco Use   Smoking status: Former    Types: Cigarettes    Start date: 1986    Passive exposure: Past   Smokeless tobacco: Never  Vaping Use   Vaping status: Never Used  Substance and Sexual Activity   Alcohol use: Not Currently    Comment: 2 beers per week   Drug use: No   Sexual activity: Not on file  Other Topics Concern   Not on file  Social History Narrative   Not on file   Social Drivers of Health   Financial Resource Strain: High Risk (11/17/2022)   Overall Financial Resource Strain (CARDIA)    Difficulty of Paying Living Expenses: Very hard  Food Insecurity: No Food Insecurity (11/12/2022)   Hunger Vital Sign    Worried About Running Out of Food in the Last  Year: Never true    Ran Out of Food in the Last Year: Never true  Transportation Needs: No Transportation Needs (11/12/2022)   PRAPARE - Administrator, Civil Service (Medical): No    Lack of Transportation (Non-Medical): No  Physical Activity: Not on file  Stress: Not on file  Social Connections: Not on file  Intimate Partner Violence: Not At Risk (11/12/2022)   Humiliation, Afraid, Rape, and Kick questionnaire    Fear of Current or Ex-Partner: No    Emotionally Abused: No    Physically Abused: No    Sexually Abused: No    No Known Allergies  Current Outpatient Medications  Medication Sig Dispense Refill   acetaminophen  (TYLENOL ) 500 MG tablet Take 500 mg by mouth every 6 (six) hours as needed for moderate pain.     acyclovir  (ZOVIRAX ) 200 MG capsule Take 1 capsule (200 mg total) by mouth 2 (two) times daily. 60 capsule 2   Calcium  Carbonate (CALCIUM  600 PO) Take 600 mg by mouth 2 (two) times daily.     DARZALEX  FASPRO 1800-30000 MG-UT/15ML SOLN Inject into the skin.     dexamethasone  (DECADRON ) 4 MG tablet Take 10 tablets by mouth once a week, skip 9/6 and take it 9/9  instead with treatment (Patient taking differently: Take 10 tablets by mouth once a week, skip 9/6 and take it 9/9 instead with treatment. PT STATES HE TOOK ON 12/05/22) 40 tablet 4   ferrous sulfate 325 (65 FE) MG tablet Take 325 mg by mouth daily with breakfast.     furosemide  (LASIX ) 40 MG tablet Take 40 mg by mouth every other day.     gabapentin  (NEURONTIN ) 100 MG capsule `T1C BY MOUTH THREE TIMES DAILY 90 capsule 1   prochlorperazine  (COMPAZINE ) 10 MG tablet Take 1 tablet (10 mg total) by mouth every 6 (six) hours as needed. 30 tablet 2   sodium bicarbonate  650 MG tablet Take 1 tablet (650 mg total) by mouth 2 (two) times daily. 60 tablet 0   ferric citrate  (AURYXIA ) 1 GM 210 MG(Fe) tablet Take 2 tablets (420 mg total) by mouth 3 (three) times daily with meals. (Patient not taking: Reported on 05/04/2023) 180  tablet 0   oxyCODONE  (OXY IR/ROXICODONE ) 5 MG immediate release tablet Take 1 tablet (5 mg total) by mouth every 6 (six) hours as needed for moderate pain (pain score 4-6) (for pain score of 1-4). (Patient not taking: Reported on 08/06/2023) 12 tablet 0   No current facility-administered medications for this visit.    PHYSICAL EXAM There were no vitals filed for this visit.  Chronically ill man in no distress Regular rate and rhythm Unlabored breathing Right neck abscess at [facility] counter incision site   PERTINENT LABORATORY AND RADIOLOGIC DATA  Most recent CBC    Latest Ref Rng & Units 08/03/2023    1:45 PM 07/27/2023    9:51 AM 07/20/2023    1:23 PM  CBC  WBC 4.0 - 10.5 K/uL 7.4  4.5  3.7   Hemoglobin 13.0 - 17.0 g/dL 7.7  8.9  6.8   Hematocrit 39.0 - 52.0 % 22.7  26.2  20.2   Platelets 150 - 400 K/uL 108  122  129      Most recent CMP    Latest Ref Rng & Units 08/03/2023    1:45 PM 07/27/2023    9:51 AM 07/20/2023    1:23 PM  CMP  Glucose 70 - 99 mg/dL 87  96  89   BUN 6 - 20 mg/dL 56  59  40   Creatinine 0.61 - 1.24 mg/dL 6.57  8.46  9.62   Sodium 135 - 145 mmol/L 131  137  137   Potassium 3.5 - 5.1 mmol/L 4.0  4.2  4.0   Chloride 98 - 111 mmol/L 96  99  101   CO2 22 - 32 mmol/L 25  27  28    Calcium  8.9 - 10.3 mg/dL 8.6  9.1  9.4   Total Protein 6.5 - 8.1 g/dL 6.2  6.1  5.9   Total Bilirubin 0.0 - 1.2 mg/dL 0.3  0.4  0.3   Alkaline Phos 38 - 126 U/L 60  55  47   AST 15 - 41 U/L 19  14  17    ALT 0 - 44 U/L 10  9  10      Renal function Estimated Creatinine Clearance: 12.6 mL/min (A) (by C-G formula based on SCr of 6.53 mg/dL (H)).  Hgb A1c MFr Bld (%)  Date Value  11/09/2022 5.9 (H)    Heber Little. Edgardo Goodwill, MD [facility] Phone Number: [phone] 08/06/2023 12:20 PM   Total time spent on preparing this encounter including chart review,  data review, collecting history, examining the patient, and coordinating care: 60  minutes  Portions of this report may have been transcribed using voice recognition software.  Every effort has been made to ensure accuracy; however, inadvertent computerized transcription errors may still be present.

## 2023-08-06 NOTE — Progress Notes (Signed)
 DATE OF SERVICE: 08/06/2023  PATIENT:  Drew Cooper  58 y.o. male  PRE-OPERATIVE DIAGNOSIS: Right neck abscess after TDC removal  POST-OPERATIVE DIAGNOSIS:  Same  PROCEDURE:   Incision and drainage of right neck abscess  SURGEON: T.  N.  Edgardo Goodwill, MD FACS  ASSISTANT: none  ANESTHESIA:   local  EBL: Minimal  BLOOD ADMINISTERED:none  DRAINS: none   LOCAL MEDICATIONS USED:  NONE  SPECIMEN:  none  COUNTS: confirmed correct.  TOURNIQUET:  none  PATIENT DISPOSITION:  Home   Delay start of Pharmacological VTE agent (>24hrs) due to surgical blood loss or risk of bleeding: yes  INDICATION FOR PROCEDURE: [name] is a 58 y.o. male with right neck abscess after removal of tunneled dialysis catheter. The patient was offered incision and drainage. The patient understood and wished to proceed.  OPERATIVE FINDINGS: Purulent fluid cavity.  Wound packed with iodoform.  DESCRIPTION OF PROCEDURE: After identification of the patient in the office procedure suite, the patient was positioned supine on the procedure room table. The right neck was prepped and draped in standard fashion.  A generous infiltration of 1% lidocaine  was performed around the abscess.  An 11 blade was used to open the abscess cavity.  About 10 mL of purulent fluid was evacuated.  The cavity was probed and any loculations broken up.  I did reopen the tunnel track a bit and express some purulence from this.  Quarter inch iodoform strip was packed into the wound to keep it open while any remaining infection drain out.  A pressure dressing was applied to the neck.  Heber Little. Edgardo Goodwill, MD [facility] Special[facility] of [facility] Phone Number: [phone] 08/06/2023 7:30 PM

## 2023-08-10 ENCOUNTER — Other Ambulatory Visit: Payer: Self-pay | Admitting: Medical Oncology

## 2023-08-10 ENCOUNTER — Encounter: Payer: Self-pay | Admitting: Medical Oncology

## 2023-08-10 ENCOUNTER — Inpatient Hospital Stay

## 2023-08-10 ENCOUNTER — Telehealth: Payer: Self-pay

## 2023-08-10 VITALS — BP 141/79 | HR 81 | Temp 98.1°F | Resp 18 | Wt 184.0 lb

## 2023-08-10 DIAGNOSIS — Z5112 Encounter for antineoplastic immunotherapy: Secondary | ICD-10-CM | POA: Diagnosis not present

## 2023-08-10 DIAGNOSIS — D631 Anemia in chronic kidney disease: Secondary | ICD-10-CM

## 2023-08-10 DIAGNOSIS — C9000 Multiple myeloma not having achieved remission: Secondary | ICD-10-CM

## 2023-08-10 LAB — CBC WITH DIFFERENTIAL (CANCER CENTER ONLY)
Abs Immature Granulocytes: 0.03 10*3/uL (ref 0.00–0.07)
Basophils Absolute: 0.1 10*3/uL (ref 0.0–0.1)
Basophils Relative: 2 %
Eosinophils Absolute: 0.2 10*3/uL (ref 0.0–0.5)
Eosinophils Relative: 4 %
HCT: 25.5 % — ABNORMAL LOW (ref 39.0–52.0)
Hemoglobin: 8.6 g/dL — ABNORMAL LOW (ref 13.0–17.0)
Immature Granulocytes: 1 %
Lymphocytes Relative: 11 %
Lymphs Abs: 0.4 10*3/uL — ABNORMAL LOW (ref 0.7–4.0)
MCH: 31.9 pg (ref 26.0–34.0)
MCHC: 33.7 g/dL (ref 30.0–36.0)
MCV: 94.4 fL (ref 80.0–100.0)
Monocytes Absolute: 0.5 10*3/uL (ref 0.1–1.0)
Monocytes Relative: 12 %
Neutro Abs: 2.9 10*3/uL (ref 1.7–7.7)
Neutrophils Relative %: 70 %
Platelet Count: 144 10*3/uL — ABNORMAL LOW (ref 150–400)
RBC: 2.7 MIL/uL — ABNORMAL LOW (ref 4.22–5.81)
RDW: 18.6 % — ABNORMAL HIGH (ref 11.5–15.5)
WBC Count: 4.1 10*3/uL (ref 4.0–10.5)
nRBC: 0 % (ref 0.0–0.2)

## 2023-08-10 LAB — SAMPLE TO BLOOD BANK

## 2023-08-10 LAB — CMP (CANCER CENTER ONLY)
ALT: 8 U/L (ref 0–44)
AST: 14 U/L — ABNORMAL LOW (ref 15–41)
Albumin: 3.8 g/dL (ref 3.5–5.0)
Alkaline Phosphatase: 53 U/L (ref 38–126)
Anion gap: 10 (ref 5–15)
BUN: 49 mg/dL — ABNORMAL HIGH (ref 6–20)
CO2: 24 mmol/L (ref 22–32)
Calcium: 8.7 mg/dL — ABNORMAL LOW (ref 8.9–10.3)
Chloride: 102 mmol/L (ref 98–111)
Creatinine: 6.1 mg/dL — ABNORMAL HIGH (ref 0.61–1.24)
GFR, Estimated: 10 mL/min — ABNORMAL LOW (ref >60)
Glucose, Bld: 89 mg/dL (ref 70–99)
Potassium: 4 mmol/L (ref 3.5–5.1)
Sodium: 136 mmol/L (ref 135–145)
Total Bilirubin: 0.3 mg/dL (ref 0.0–1.2)
Total Protein: 6.2 g/dL — ABNORMAL LOW (ref 6.5–8.1)

## 2023-08-10 MED ORDER — PALONOSETRON HCL INJECTION 0.25 MG/5ML
0.2500 mg | Freq: Once | INTRAVENOUS | Status: AC
  Administered 2023-08-10: 0.25 mg via INTRAVENOUS
  Filled 2023-08-10: qty 5

## 2023-08-10 MED ORDER — DEXAMETHASONE 4 MG PO TABS
40.0000 mg | ORAL_TABLET | Freq: Once | ORAL | Status: AC
  Administered 2023-08-10: 40 mg via ORAL
  Filled 2023-08-10: qty 10

## 2023-08-10 MED ORDER — BORTEZOMIB CHEMO SQ INJECTION 3.5 MG (2.5MG/ML)
1.5000 mg/m2 | Freq: Once | INTRAMUSCULAR | Status: AC
  Administered 2023-08-10: 3 mg via SUBCUTANEOUS
  Filled 2023-08-10: qty 1.2

## 2023-08-10 MED ORDER — SODIUM CHLORIDE 0.9 % IV SOLN
500.0000 mg/m2 | Freq: Once | INTRAVENOUS | Status: AC
  Administered 2023-08-10: 1000 mg via INTRAVENOUS
  Filled 2023-08-10: qty 50

## 2023-08-10 NOTE — Telephone Encounter (Signed)
 Pt called to see if he needed to f/u regarding his neck wound that was packed last week in office. Pt is currently at chemo. He feels it is improving and they will place a dressing over it. Pt will call us  if he needs further assistance.

## 2023-08-10 NOTE — Progress Notes (Signed)
 Per Marguerita Shih MD, ok to treat today with SCR 6.10  Per Marguerita Shih ok to treat today with neck wound. I&D performed in IR and pt reports resolution of wound pain and improvement in redness and minimal drainage.

## 2023-08-10 NOTE — Progress Notes (Signed)
 Per [name] ,it is ok to treat pt today with cytoxan  and velcade  and serum creatinine =6.10.and abscess reported as much improved.

## 2023-08-10 NOTE — Patient Instructions (Signed)
 [facility] - A DEPT OF [facility]  Discharge Instructions: Thank you for choosing [facility] to provide your oncology and hematology care.   If you have a lab appointment with the Cancer Center, please go directly to the Cancer Center and check in at the registration area.   Wear comfortable clothing and clothing appropriate for easy access to any Portacath or PICC line.   We strive to give you quality time with your provider. You may need to reschedule your appointment if you arrive late (15 or more minutes).  Arriving late affects you and other patients whose appointments are after yours.  Also, if you miss three or more appointments without notifying the office, you may be dismissed from the clinic at the provider's discretion.      For prescription refill requests, have your pharmacy contact our office and allow 72 hours for refills to be completed.    Today you received the following chemotherapy and/or immunotherapy agents: Cytoxan, Velcade      To help prevent nausea and vomiting after your treatment, we encourage you to take your nausea medication as directed.  BELOW ARE SYMPTOMS THAT SHOULD BE REPORTED IMMEDIATELY: *FEVER GREATER THAN 100.4 F (38 C) OR HIGHER *CHILLS OR SWEATING *NAUSEA AND VOMITING THAT IS NOT CONTROLLED WITH YOUR NAUSEA MEDICATION *UNUSUAL SHORTNESS OF BREATH *UNUSUAL BRUISING OR BLEEDING *URINARY PROBLEMS (pain or burning when urinating, or frequent urination) *BOWEL PROBLEMS (unusual diarrhea, constipation, pain near the anus) TENDERNESS IN MOUTH AND THROAT WITH OR WITHOUT PRESENCE OF ULCERS (sore throat, sores in mouth, or a toothache) UNUSUAL RASH, SWELLING OR PAIN  UNUSUAL VAGINAL DISCHARGE OR ITCHING   Items with * indicate a potential emergency and should be followed up as soon as possible or go to the Emergency Department if any problems should occur.  Please show the CHEMOTHERAPY ALERT CARD or  IMMUNOTHERAPY ALERT CARD at check-in to the Emergency Department and triage nurse.  Should you have questions after your visit or need to cancel or reschedule your appointment, please contact [facility] - A DEPT OF [facility]  Dept: [phone]  and follow the prompts.  Office hours are 8:00 a.m. to 4:30 p.m. Monday - Friday. Please note that voicemails left after 4:00 p.m. may not be returned until the following business day.  We are closed weekends and major holidays. You have access to a nurse at all times for urgent questions. Please call the main number to the clinic Dept: [phone] and follow the prompts.   For any non-urgent questions, you may also contact your provider using MyChart. We now offer e-Visits for anyone 71 and older to request care online for non-urgent symptoms. For details visit [email or web address].   Also download the MyChart app! Go to the app store, search "MyChart", open the app, select [place], and log in with your MyChart username and password.

## 2023-08-11 ENCOUNTER — Ambulatory Visit: Admitting: Vascular Surgery

## 2023-08-17 ENCOUNTER — Ambulatory Visit: Payer: Medicaid Other

## 2023-08-17 ENCOUNTER — Other Ambulatory Visit: Payer: Medicaid Other

## 2023-08-18 ENCOUNTER — Other Ambulatory Visit: Payer: Self-pay | Admitting: Internal Medicine

## 2023-08-25 ENCOUNTER — Inpatient Hospital Stay

## 2023-08-25 ENCOUNTER — Ambulatory Visit: Payer: Medicaid Other | Admitting: Internal Medicine

## 2023-08-25 ENCOUNTER — Ambulatory Visit: Payer: Medicaid Other

## 2023-08-25 ENCOUNTER — Other Ambulatory Visit: Payer: Medicaid Other

## 2023-08-25 ENCOUNTER — Inpatient Hospital Stay (HOSPITAL_BASED_OUTPATIENT_CLINIC_OR_DEPARTMENT_OTHER): Admitting: Internal Medicine

## 2023-08-25 VITALS — BP 124/64 | HR 88 | Temp 97.4°F | Resp 17 | Ht 70.0 in | Wt 178.1 lb

## 2023-08-25 DIAGNOSIS — C9000 Multiple myeloma not having achieved remission: Secondary | ICD-10-CM | POA: Diagnosis not present

## 2023-08-25 DIAGNOSIS — D631 Anemia in chronic kidney disease: Secondary | ICD-10-CM

## 2023-08-25 DIAGNOSIS — Z5112 Encounter for antineoplastic immunotherapy: Secondary | ICD-10-CM | POA: Diagnosis not present

## 2023-08-25 LAB — CMP (CANCER CENTER ONLY)
ALT: 19 U/L (ref 0–44)
AST: 31 U/L (ref 15–41)
Albumin: 4.2 g/dL (ref 3.5–5.0)
Alkaline Phosphatase: 53 U/L (ref 38–126)
Anion gap: 9 (ref 5–15)
BUN: 17 mg/dL (ref 6–20)
CO2: 36 mmol/L — ABNORMAL HIGH (ref 22–32)
Calcium: 9.1 mg/dL (ref 8.9–10.3)
Chloride: 96 mmol/L — ABNORMAL LOW (ref 98–111)
Creatinine: 3.94 mg/dL — ABNORMAL HIGH (ref 0.61–1.24)
GFR, Estimated: 17 mL/min — ABNORMAL LOW (ref >60)
Glucose, Bld: 95 mg/dL (ref 70–99)
Potassium: 3.3 mmol/L — ABNORMAL LOW (ref 3.5–5.1)
Sodium: 141 mmol/L (ref 135–145)
Total Bilirubin: 0.3 mg/dL (ref 0.0–1.2)
Total Protein: 6.6 g/dL (ref 6.5–8.1)

## 2023-08-25 LAB — CBC WITH DIFFERENTIAL (CANCER CENTER ONLY)
Abs Immature Granulocytes: 0.02 10*3/uL (ref 0.00–0.07)
Basophils Absolute: 0.1 10*3/uL (ref 0.0–0.1)
Basophils Relative: 2 %
Eosinophils Absolute: 0.2 10*3/uL (ref 0.0–0.5)
Eosinophils Relative: 5 %
HCT: 23.3 % — ABNORMAL LOW (ref 39.0–52.0)
Hemoglobin: 8 g/dL — ABNORMAL LOW (ref 13.0–17.0)
Immature Granulocytes: 1 %
Lymphocytes Relative: 13 %
Lymphs Abs: 0.5 10*3/uL — ABNORMAL LOW (ref 0.7–4.0)
MCH: 32.3 pg (ref 26.0–34.0)
MCHC: 34.3 g/dL (ref 30.0–36.0)
MCV: 94 fL (ref 80.0–100.0)
Monocytes Absolute: 0.4 10*3/uL (ref 0.1–1.0)
Monocytes Relative: 11 %
Neutro Abs: 2.4 10*3/uL (ref 1.7–7.7)
Neutrophils Relative %: 68 %
Platelet Count: 151 10*3/uL (ref 150–400)
RBC: 2.48 MIL/uL — ABNORMAL LOW (ref 4.22–5.81)
RDW: 18.8 % — ABNORMAL HIGH (ref 11.5–15.5)
WBC Count: 3.5 10*3/uL — ABNORMAL LOW (ref 4.0–10.5)
nRBC: 0 % (ref 0.0–0.2)

## 2023-08-25 LAB — SAMPLE TO BLOOD BANK

## 2023-08-25 NOTE — Progress Notes (Signed)
 [facility] [phone]   [phone]  OFFICE PROGRESS NOTE  Drew Austria, MD [address] [place] [address]  DIAGNOSIS: Recently diagnosed with multiple myeloma with lytic bone lesions in addition to renal insufficiency and bone marrow biopsy and aspirate consistent with plasma cell neoplasm with 53% plasma cells. This was diagnosed in August 2024.    PRIOR THERAPY:  Plasmapheresis status post ~4 treatments    CURRENT THERAPY: Systemic chemotherapy with subcutaneous Velcade  1.3 Mg/KG on days 1, 4, 8 and 11 every 3 weeks, cyclophosphamide  500 Mg/M2 IV on days 1 and 8 as well as Decadron  40 mg p.o. on weekly basis start with the first dose of Velcade .  He is status post 2 cycles.  Once his renal function improves, [name] will likely recommend changing treatment to standard treatment with daratumumab , Velcade , Revlimid and Decadron  for 7 cycles followed by evaluation for autologous stem cell transplant.  He is status post 5 cycles of the new regimen with daratumumab  subcutaneously, cyclophosphamide , subcutaneous Velcade  and dexamethasone  every 4 weeks   INTERVAL HISTORY: Drew Cooper 58 y.o. male returns to the clinic today for follow-up visit. Discussed the use of AI scribe software for clinical note transcription with the patient, who gave verbal consent to proceed.  History of Present Illness   Drew Cooper is a 58 year old male with multiple myeloma who presents for evaluation before starting cycle eight of chemotherapy.  He was diagnosed with multiple myeloma in August 2024 and has been undergoing treatment with subcutaneous Velcade , cyclophosphamide , subcutaneous daratumumab , and Decadron . He has completed seven cycles and is here for evaluation before starting cycle eight. No new complaints or issues since the last visit. He describes the treatment regimen as extensive.  He undergoes dialysis three times a week on  Tuesday, Thursday, and Saturday, with a session completed this morning. His kidney function remains impaired, with high serum creatinine levels, which has delayed the consideration of a stem cell transplant.  He had a recent visit approximately three weeks ago, where discussions about a potential stem cell transplant took place. The kidney issues have been a factor in delaying the transplant, which would have been considered after the first four cycles if not for the kidney problems.  His current medications include Velcade , cyclophosphamide , daratumumab , and Decadron . He has anemia, with a hemoglobin level of 8.       MEDICAL HISTORY: Past Medical History:  Diagnosis Date   Cancer (HCC) 10/2022   multiple myeloma   Chronic kidney disease     ALLERGIES:  has no known allergies.  MEDICATIONS:  Current Outpatient Medications  Medication Sig Dispense Refill   acetaminophen  (TYLENOL ) 500 MG tablet Take 500 mg by mouth every 6 (six) hours as needed for moderate pain.     acyclovir  (ZOVIRAX ) 200 MG capsule Take 1 capsule (200 mg total) by mouth 2 (two) times daily. 60 capsule 2   Calcium  Carbonate (CALCIUM  600 PO) Take 600 mg by mouth 2 (two) times daily.     DARZALEX  FASPRO 1800-30000 MG-UT/15ML SOLN Inject into the skin.     dexamethasone  (DECADRON ) 4 MG tablet Take 10 tablets by mouth once a week, skip 9/6 and take it 9/9 instead with treatment (Patient taking differently: Take 10 tablets by mouth once a week, skip 9/6 and take it 9/9 instead with treatment. PT STATES HE TOOK ON 12/05/22) 40 tablet 4   ferric citrate  (AURYXIA ) 1 GM 210 MG(Fe)  tablet Take 2 tablets (420 mg total) by mouth 3 (three) times daily with meals. (Patient not taking: Reported on 05/04/2023) 180 tablet 0   ferrous sulfate 325 (65 FE) MG tablet Take 325 mg by mouth daily with breakfast.     furosemide  (LASIX ) 40 MG tablet Take 40 mg by mouth every other day.     gabapentin  (NEURONTIN ) 100 MG capsule TAKE ONE CAPSULE BY  MOUTH THREE TIMES DAILY 90 capsule 1   oxyCODONE  (OXY IR/ROXICODONE ) 5 MG immediate release tablet Take 1 tablet (5 mg total) by mouth every 6 (six) hours as needed for moderate pain (pain score 4-6) (for pain score of 1-4). (Patient not taking: Reported on 08/06/2023) 12 tablet 0   prochlorperazine  (COMPAZINE ) 10 MG tablet Take 1 tablet (10 mg total) by mouth every 6 (six) hours as needed. 30 tablet 2   sodium bicarbonate  650 MG tablet Take 1 tablet (650 mg total) by mouth 2 (two) times daily. 60 tablet 0   No current facility-administered medications for this visit.    SURGICAL HISTORY:  Past Surgical History:  Procedure Laterality Date   AV FISTULA PLACEMENT Left 03/26/2023   Procedure: LEFT ARM FISTULA CREATION;  Surgeon: [name], MD;  Location: [facility] OR;  Service: Vascular;  Laterality: Left;   IR FLUORO GUIDE CV LINE RIGHT  11/13/2022   IR US  GUIDE VASC ACCESS RIGHT  11/13/2022   KNEE SURGERY      REVIEW OF SYSTEMS:  A comprehensive review of systems was negative except for: Constitutional: positive for fatigue   PHYSICAL EXAMINATION: General appearance: alert, cooperative, fatigued, and no distress Head: Normocephalic, without obvious abnormality, atraumatic Neck: no adenopathy, no JVD, supple, symmetrical, trachea midline, and thyroid not enlarged, symmetric, no tenderness/mass/nodules Lymph nodes: Cervical, supraclavicular, and axillary nodes normal. Resp: clear to auscultation bilaterally Back: symmetric, no curvature. ROM normal. No CVA tenderness. Cardio: regular rate and rhythm, S1, S2 normal, no murmur, click, rub or gallop GI: soft, non-tender; bowel sounds normal; no masses,  no organomegaly Extremities: extremities normal, atraumatic, no cyanosis or edema  ECOG PERFORMANCE STATUS: 1 - Symptomatic but completely ambulatory  Blood pressure 124/64, pulse 88, temperature (!) 97.4 F (36.3 C), temperature source Temporal, resp. rate 17, height 5\' 10"  (1.778 m), weight  178 lb 1.6 oz (80.8 kg), SpO2 100%.  LABORATORY DATA: Lab Results  Component Value Date   WBC 3.5 (L) 08/25/2023   HGB 8.0 (L) 08/25/2023   HCT 23.3 (L) 08/25/2023   MCV 94.0 08/25/2023   PLT 151 08/25/2023      Chemistry      Component Value Date/Time   NA 136 08/10/2023 0845   K 4.0 08/10/2023 0845   CL 102 08/10/2023 0845   CO2 24 08/10/2023 0845   BUN 49 (H) 08/10/2023 0845   CREATININE 6.10 (H) 08/10/2023 0845      Component Value Date/Time   CALCIUM  8.7 (L) 08/10/2023 0845   ALKPHOS 53 08/10/2023 0845   AST 14 (L) 08/10/2023 0845   ALT 8 08/10/2023 0845   BILITOT 0.3 08/10/2023 0845       RADIOGRAPHIC STUDIES: No results found.   ASSESSMENT AND PLAN: This is a very pleasant 58 years old white male with multiple myeloma with lytic bone lesions in addition to renal insufficiency and bone marrow biopsy and aspirate consistent with plasma cell neoplasm with 53% plasma cells. This was diagnosed in August 2024. He is currently undergoing systemic chemotherapy with subcutaneous Velcade  1.3 Mg/KG on days  1, 4, 8 and 11 every 3 weeks, cyclophosphamide  500 Mg/M2 IV on days 1 and 8 as well as Decadron  40 mg p.o. on weekly basis start with the first dose of Velcade .  He is status post 3 cycles.  Unfortunately no improvement in his renal function so far He was referred to [name] at [facility] who recommended adding daratumumab  to his current regimen with the hope for improvement of his renal function.  He is status post 7 cycles of the new regimen.  He has been tolerating this treatment fairly well except for the fatigue.     Multiple myeloma Diagnosed in August 2024, currently post seven cycles of treatment with subcutaneous Velcade , cyclophosphamide , subcutaneous daratumumab , and Decadron . Responding well to treatment. Stem cell transplant is a viable option but delayed due to kidney issues. Anemia present with hemoglobin at 8, no transfusion required. If kidney  function were normal, transplant would have been pursued after the first four cycles. - Proceed with cycle 8 of treatment as planned. - Coordinate with [name] regarding potential stem cell transplant.  Chronic kidney disease, stage 5 Requiring dialysis three times a week (Tuesday, Thursday, Saturday). Serum creatinine expected to remain high, even post-dialysis.  Anemia in chronic disease Secondary to chronic disease, with current hemoglobin level at 8. No immediate need for blood transfusion.     The patient was advised to call immediately if he has any concerning symptoms in the interval. The patient voices understanding of current disease status and treatment options and is in agreement with the current care plan.  All questions were answered. The patient knows to call the clinic with any problems, questions or concerns. We can certainly see the patient much sooner if necessary.  The total time spent in the appointment was 20 minutes.  Disclaimer: This note was dictated with voice recognition software. Similar sounding words can inadvertently be transcribed and may not be corrected upon review.

## 2023-08-26 ENCOUNTER — Inpatient Hospital Stay

## 2023-08-26 VITALS — BP 137/66 | HR 86 | Temp 98.2°F | Resp 16

## 2023-08-26 DIAGNOSIS — Z5112 Encounter for antineoplastic immunotherapy: Secondary | ICD-10-CM | POA: Diagnosis not present

## 2023-08-26 DIAGNOSIS — C9000 Multiple myeloma not having achieved remission: Secondary | ICD-10-CM

## 2023-08-26 MED ORDER — DIPHENHYDRAMINE HCL 25 MG PO CAPS
50.0000 mg | ORAL_CAPSULE | Freq: Once | ORAL | Status: AC
  Administered 2023-08-26: 50 mg via ORAL
  Filled 2023-08-26: qty 2

## 2023-08-26 MED ORDER — DEXAMETHASONE 4 MG PO TABS
40.0000 mg | ORAL_TABLET | Freq: Once | ORAL | Status: AC
  Administered 2023-08-26: 40 mg via ORAL
  Filled 2023-08-26: qty 10

## 2023-08-26 MED ORDER — BORTEZOMIB CHEMO SQ INJECTION 3.5 MG (2.5MG/ML)
1.5000 mg/m2 | Freq: Once | INTRAMUSCULAR | Status: AC
  Administered 2023-08-26: 3 mg via SUBCUTANEOUS
  Filled 2023-08-26: qty 1.2

## 2023-08-26 MED ORDER — DARATUMUMAB-HYALURONIDASE-FIHJ 1800-30000 MG-UT/15ML ~~LOC~~ SOLN
1800.0000 mg | Freq: Once | SUBCUTANEOUS | Status: AC
  Administered 2023-08-26: 1800 mg via SUBCUTANEOUS
  Filled 2023-08-26: qty 15

## 2023-08-26 MED ORDER — ACETAMINOPHEN 325 MG PO TABS
650.0000 mg | ORAL_TABLET | Freq: Once | ORAL | Status: AC
  Administered 2023-08-26: 650 mg via ORAL
  Filled 2023-08-26: qty 2

## 2023-08-26 MED ORDER — SODIUM CHLORIDE 0.9 % IV SOLN
500.0000 mg/m2 | Freq: Once | INTRAVENOUS | Status: AC
  Administered 2023-08-26: 1000 mg via INTRAVENOUS
  Filled 2023-08-26: qty 50

## 2023-08-26 MED ORDER — PALONOSETRON HCL INJECTION 0.25 MG/5ML
0.2500 mg | Freq: Once | INTRAVENOUS | Status: AC
  Administered 2023-08-26: 0.25 mg via INTRAVENOUS
  Filled 2023-08-26: qty 5

## 2023-08-26 NOTE — Patient Instructions (Signed)
 [facility] - A DEPT OF [facility]  Discharge Instructions: Thank you for choosing [facility] to provide your oncology and hematology care.   If you have a lab appointment with the Cancer Center, please go directly to the Cancer Center and check in at the registration area.   Wear comfortable clothing and clothing appropriate for easy access to any Portacath or PICC line.   We strive to give you quality time with your provider. You may need to reschedule your appointment if you arrive late (15 or more minutes).  Arriving late affects you and other patients whose appointments are after yours.  Also, if you miss three or more appointments without notifying the office, you may be dismissed from the clinic at the provider's discretion.      For prescription refill requests, have your pharmacy contact our office and allow 72 hours for refills to be completed.    Today you received the following chemotherapy and/or immunotherapy agents cytoxan , darzalex  faspro, velcade       To help prevent nausea and vomiting after your treatment, we encourage you to take your nausea medication as directed.  BELOW ARE SYMPTOMS THAT SHOULD BE REPORTED IMMEDIATELY: *FEVER GREATER THAN 100.4 F (38 C) OR HIGHER *CHILLS OR SWEATING *NAUSEA AND VOMITING THAT IS NOT CONTROLLED WITH YOUR NAUSEA MEDICATION *UNUSUAL SHORTNESS OF BREATH *UNUSUAL BRUISING OR BLEEDING *URINARY PROBLEMS (pain or burning when urinating, or frequent urination) *BOWEL PROBLEMS (unusual diarrhea, constipation, pain near the anus) TENDERNESS IN MOUTH AND THROAT WITH OR WITHOUT PRESENCE OF ULCERS (sore throat, sores in mouth, or a toothache) UNUSUAL RASH, SWELLING OR PAIN  UNUSUAL VAGINAL DISCHARGE OR ITCHING   Items with * indicate a potential emergency and should be followed up as soon as possible or go to the Emergency Department if any problems should occur.  Please show the CHEMOTHERAPY ALERT  CARD or IMMUNOTHERAPY ALERT CARD at check-in to the Emergency Department and triage nurse.  Should you have questions after your visit or need to cancel or reschedule your appointment, please contact [facility] - A DEPT OF [facility]  Dept: [phone]  and follow the prompts.  Office hours are 8:00 a.m. to 4:30 p.m. Monday - Friday. Please note that voicemails left after 4:00 p.m. may not be returned until the following business day.  We are closed weekends and major holidays. You have access to a nurse at all times for urgent questions. Please call the main number to the clinic Dept: [phone] and follow the prompts.   For any non-urgent questions, you may also contact your provider using MyChart. We now offer e-Visits for anyone 85 and older to request care online for non-urgent symptoms. For details visit [email or web address].   Also download the MyChart app! Go to the app store, search "MyChart", open the app, select [facility], and log in with your MyChart username and password.

## 2023-08-27 ENCOUNTER — Encounter: Payer: Self-pay | Admitting: Internal Medicine

## 2023-08-27 NOTE — Addendum Note (Signed)
 Addended by: Ezell Hollow on: 08/27/2023 09:06 AM   Modules accepted: Orders

## 2023-08-28 NOTE — Progress Notes (Unsigned)
 Drew Cooper OFFICE PROGRESS NOTE  Drew Austria, MD [address] [place] [address]  DIAGNOSIS: Multiple myeloma with lytic bone lesions in addition to renal insufficiency and bone marrow biopsy and aspirate consistent with plasma cell neoplasm with 53% plasma cells. This was diagnosed in August 2024.   PRIOR THERAPY:  Plasmapheresis status post ~4 treatments   CURRENT THERAPY: First dose of treatment given while inpatient with  subcutaneous Velcade  1.3 Mg/KG on days 1, 4, 8 and 11 every 3 weeks, cyclophosphamide  500 Mg/M2 IV on days 1 and 8 as well as Decadron  40 mg p.o. on weekly basis start with the first dose of Velcade . Dr. Marguerita Cooper added daratumumab . He is here for day 1 cycle #4 with Daratumumab , cytoxan , velcade , and dexamethasone .   INTERVAL HISTORY: Drew Cooper 58 y.o. male returns to the clinic today for a follow-up visit. The patient was last seen by Dr. Marguerita Cooper on 08/25/23. The patient is currently undergoing treatment for multiple myeloma. He tolerates this well without any concerning adverse side effects.   In early May, he had right neck abscess after TDC removal which is significantly better at this time.   The patient saw Dr. Bernetta Cooper a few weeks ago about potential stem cell transplant. He has the contact to a coordinator in the office who he needs to reach out to to discuss next steps.   He did not take his 40 mg of decadron  this morning, so he will need to receive this here.   He denies any fever, chills, night sweats, or unexplained weight loss.  His energy is "okay" but he can tell when he needs another blood transfusion. He states his breathing is "good".  Denies any chest pain, shortness of breath, cough, or hemoptysis.  The patient does struggle with frequent anemia patient does have end-stage renal disease and started dialysis which he is so far been tolerating well. He denies any nausea, vomiting, diarrhea, or constipation. Denies any signs or  symptoms of infection. Denies any abnormal bleeding but does have easy bruising on his extremities.  He denies any unusual bone pain. He is here today for evaluation repeat blood work before undergoing day 1 cycle 8.     MEDICAL HISTORY: Past Medical History:  Diagnosis Date   Cancer (HCC) 10/2022   multiple myeloma   Chronic kidney disease     ALLERGIES:  has no known allergies.  MEDICATIONS:  Current Outpatient Medications  Medication Sig Dispense Refill   acetaminophen  (TYLENOL ) 500 MG tablet Take 500 mg by mouth every 6 (six) hours as needed for moderate pain.     acyclovir  (ZOVIRAX ) 200 MG capsule Take 1 capsule (200 mg total) by mouth 2 (two) times daily. 60 capsule 2   B Complex-C-Folic Acid (RENA-VITE RX) 1 MG TABS Take 1 tablet by mouth.     Calcium  Carbonate (CALCIUM  600 PO) Take 600 mg by mouth 2 (two) times daily.     DARZALEX  FASPRO 1800-30000 MG-UT/15ML SOLN Inject into the skin.     gabapentin  (NEURONTIN ) 100 MG capsule TAKE ONE CAPSULE BY MOUTH THREE TIMES DAILY 90 capsule 1   lidocaine -prilocaine (EMLA) cream APPLY a thin AMOUNT TO TO THE ARM ONE hrr prior TO dialysis three times PER WEEK     prochlorperazine  (COMPAZINE ) 10 MG tablet Take 1 tablet (10 mg total) by mouth every 6 (six) hours as needed. 30 tablet 2   sevelamer carbonate (RENVELA) 800 MG tablet Take 800 mg by mouth 3 (three)  times daily.     dexamethasone  (DECADRON ) 4 MG tablet Take 10 tablets by mouth once a week. 40 tablet 4   oxyCODONE  (OXY IR/ROXICODONE ) 5 MG immediate release tablet Take 1 tablet (5 mg total) by mouth every 6 (six) hours as needed for moderate pain (pain score 4-6) (for pain score of 1-4). (Patient not taking: Reported on 05/04/2023) 12 tablet 0   No current facility-administered medications for this visit.    SURGICAL HISTORY:  Past Surgical History:  Procedure Laterality Date   AV FISTULA PLACEMENT Left 03/26/2023   Procedure: LEFT ARM FISTULA CREATION;  Surgeon: [name], MD;  Location: [facility] OR;  Service: Vascular;  Laterality: Left;   IR FLUORO GUIDE CV LINE RIGHT  11/13/2022   IR US  GUIDE VASC ACCESS RIGHT  11/13/2022   KNEE SURGERY      REVIEW OF SYSTEMS:   Review of Systems  Constitutional: Positive for stable fatigue. Negative for appetite change, chills,  fever and unexpected weight change.  HENT:   Negative for mouth sores, nosebleeds, sore throat and trouble swallowing.   Eyes: Negative for eye problems and icterus.  Respiratory: Negative for cough, hemoptysis, shortness of breath and wheezing.   Cardiovascular: Negative for chest pain and leg swelling.  Gastrointestinal: Negative for abdominal pain, constipation, diarrhea, nausea and vomiting.  Genitourinary: Negative for bladder incontinence, difficulty urinating, dysuria, frequency and hematuria.   Musculoskeletal: Negative for back pain, gait problem, neck pain and neck stiffness.  Skin: Negative for itching and rash.  Neurological: Negative for dizziness, extremity weakness, gait problem, headaches, light-headedness and seizures.  Hematological: Negative for adenopathy. Does not bleed easily. Positive for bruising on upper extremities.  Psychiatric/Behavioral: Negative for confusion, depression and sleep disturbance. The patient is not nervous/anxious.     PHYSICAL EXAMINATION:  Blood pressure 122/68, pulse 90, temperature 97.6 F (36.4 C), temperature source Temporal, resp. rate 16, height 5\' 10"  (1.778 m), weight 178 lb 8 oz (81 kg), SpO2 99%.  ECOG PERFORMANCE STATUS: 1  Physical Exam  Constitutional: Oriented to person, place, and time and well-developed, well-nourished, and in no distress.  HENT:  Head: Normocephalic and atraumatic.  Mouth/Throat: Oropharynx is clear and moist. No oropharyngeal exudate.  Eyes: Conjunctivae are normal. Right eye exhibits no discharge. Left eye exhibits no discharge. No scleral icterus.  Neck: Normal range of motion. Neck supple.  Cardiovascular:  Normal rate, regular rhythm, normal heart sounds and intact distal pulses.   Pulmonary/Chest: Effort normal and breath sounds normal. No respiratory distress. No wheezes. No rales.  Abdominal: Soft. Bowel sounds are normal. Exhibits no distension and no mass. There is no tenderness.  Musculoskeletal: Normal range of motion. Exhibits no edema.  Lymphadenopathy:    No cervical adenopathy.  Neurological: Alert and oriented to person, place, and time. Exhibits normal muscle tone. Gait normal. Coordination normal.  Skin: Skin is warm and dry. Positive for upper extremity bruising. No rash noted. Not diaphoretic. No erythema. No pallor.  Psychiatric: Mood, memory and judgment normal.  Vitals reviewed.    LABORATORY DATA: Lab Results  Component Value Date   WBC 3.8 (L) 09/02/2023   HGB 7.3 (L) 09/02/2023   HCT 21.1 (L) 09/02/2023   MCV 95.0 09/02/2023   PLT 144 (L) 09/02/2023      Chemistry      Component Value Date/Time   NA 137 09/02/2023 0834   K 3.6 09/02/2023 0834   CL 98 09/02/2023 0834   CO2 29 09/02/2023 0834   BUN  33 (H) 09/02/2023 0834   CREATININE 5.15 (H) 09/02/2023 0834      Component Value Date/Time   CALCIUM  9.2 09/02/2023 0834   ALKPHOS 50 09/02/2023 0834   AST 20 09/02/2023 0834   ALT 13 09/02/2023 0834   BILITOT 0.3 09/02/2023 0834       RADIOGRAPHIC STUDIES:  No results found.   ASSESSMENT/PLAN:  This is a very pleasant 58 year old Caucasian male diagnosed with multiple myeloma with 53% plasma cells on bone marrow biopsy and aspirate.   The patient is here to establish care on an outpatient basis.  He was recently hospitalized in August 2020 for which showed his diagnosis of multiple myeloma.  While admitted to the hospital he underwent day 1 cycle 1.   He rstarted treatment with subcutaneous Velcade  1.3 Mg/KG on days 1, 4, 8 and 11 every 3 weeks, cyclophosphamide  500 Mg/M2 IV on days 1 and 8 as well as Decadron  40 mg p.o. on weekly basis start with the  first dose of Velcade  on 11/14/22.  In November 2024, daratumumab  was added.     His hemoglobin is 7.3 today.  His creatinine is 5.15.  He is okay to proceed with day 8 cycle 8 today scheduled.  We will try to arrange for 1-2 units of blood to be given later this week.    He will reach out to the coordinator for possible next steps for stem cell transplant. The patient knows to reach out to us  once he has information about potential dates.    The patient was advised to call immediately if he has any concerning symptoms in the interval. The patient voices understanding of current disease status and treatment options and is in agreement with the current care plan. All questions were answered. The patient knows to call the clinic with any problems, questions or concerns. We can certainly see the patient much sooner if necessary     No orders of the defined types were placed in this encounter.    The total time spent in the appointment was 20-29 minutes  Rebeca Valdivia L Muhamad Serano, PA-C 09/02/23

## 2023-09-01 ENCOUNTER — Other Ambulatory Visit

## 2023-09-01 ENCOUNTER — Ambulatory Visit

## 2023-09-02 ENCOUNTER — Inpatient Hospital Stay: Attending: Internal Medicine

## 2023-09-02 ENCOUNTER — Inpatient Hospital Stay (HOSPITAL_BASED_OUTPATIENT_CLINIC_OR_DEPARTMENT_OTHER): Admitting: Physician Assistant

## 2023-09-02 ENCOUNTER — Other Ambulatory Visit: Payer: Self-pay

## 2023-09-02 ENCOUNTER — Inpatient Hospital Stay

## 2023-09-02 VITALS — BP 122/68 | HR 90 | Temp 97.6°F | Resp 16 | Ht 70.0 in | Wt 178.5 lb

## 2023-09-02 DIAGNOSIS — D6481 Anemia due to antineoplastic chemotherapy: Secondary | ICD-10-CM

## 2023-09-02 DIAGNOSIS — Z992 Dependence on renal dialysis: Secondary | ICD-10-CM | POA: Diagnosis not present

## 2023-09-02 DIAGNOSIS — D631 Anemia in chronic kidney disease: Secondary | ICD-10-CM

## 2023-09-02 DIAGNOSIS — D630 Anemia in neoplastic disease: Secondary | ICD-10-CM | POA: Diagnosis not present

## 2023-09-02 DIAGNOSIS — N186 End stage renal disease: Secondary | ICD-10-CM | POA: Insufficient documentation

## 2023-09-02 DIAGNOSIS — C9000 Multiple myeloma not having achieved remission: Secondary | ICD-10-CM | POA: Insufficient documentation

## 2023-09-02 DIAGNOSIS — Z5112 Encounter for antineoplastic immunotherapy: Secondary | ICD-10-CM | POA: Insufficient documentation

## 2023-09-02 DIAGNOSIS — Z5111 Encounter for antineoplastic chemotherapy: Secondary | ICD-10-CM | POA: Insufficient documentation

## 2023-09-02 LAB — CMP (CANCER CENTER ONLY)
ALT: 13 U/L (ref 0–44)
AST: 20 U/L (ref 15–41)
Albumin: 3.9 g/dL (ref 3.5–5.0)
Alkaline Phosphatase: 50 U/L (ref 38–126)
Anion gap: 10 (ref 5–15)
BUN: 33 mg/dL — ABNORMAL HIGH (ref 6–20)
CO2: 29 mmol/L (ref 22–32)
Calcium: 9.2 mg/dL (ref 8.9–10.3)
Chloride: 98 mmol/L (ref 98–111)
Creatinine: 5.15 mg/dL — ABNORMAL HIGH (ref 0.61–1.24)
GFR, Estimated: 12 mL/min — ABNORMAL LOW (ref >60)
Glucose, Bld: 119 mg/dL — ABNORMAL HIGH (ref 70–99)
Potassium: 3.6 mmol/L (ref 3.5–5.1)
Sodium: 137 mmol/L (ref 135–145)
Total Bilirubin: 0.3 mg/dL (ref 0.0–1.2)
Total Protein: 6.2 g/dL — ABNORMAL LOW (ref 6.5–8.1)

## 2023-09-02 LAB — CBC WITH DIFFERENTIAL (CANCER CENTER ONLY)
Abs Immature Granulocytes: 0.02 10*3/uL (ref 0.00–0.07)
Basophils Absolute: 0.1 10*3/uL (ref 0.0–0.1)
Basophils Relative: 2 %
Eosinophils Absolute: 0.1 10*3/uL (ref 0.0–0.5)
Eosinophils Relative: 3 %
HCT: 21.1 % — ABNORMAL LOW (ref 39.0–52.0)
Hemoglobin: 7.3 g/dL — ABNORMAL LOW (ref 13.0–17.0)
Immature Granulocytes: 1 %
Lymphocytes Relative: 11 %
Lymphs Abs: 0.4 10*3/uL — ABNORMAL LOW (ref 0.7–4.0)
MCH: 32.9 pg (ref 26.0–34.0)
MCHC: 34.6 g/dL (ref 30.0–36.0)
MCV: 95 fL (ref 80.0–100.0)
Monocytes Absolute: 0.4 10*3/uL (ref 0.1–1.0)
Monocytes Relative: 9 %
Neutro Abs: 2.9 10*3/uL (ref 1.7–7.7)
Neutrophils Relative %: 74 %
Platelet Count: 144 10*3/uL — ABNORMAL LOW (ref 150–400)
RBC: 2.22 MIL/uL — ABNORMAL LOW (ref 4.22–5.81)
RDW: 18.4 % — ABNORMAL HIGH (ref 11.5–15.5)
WBC Count: 3.8 10*3/uL — ABNORMAL LOW (ref 4.0–10.5)
nRBC: 0 % (ref 0.0–0.2)

## 2023-09-02 LAB — PREPARE RBC (CROSSMATCH)

## 2023-09-02 LAB — SAMPLE TO BLOOD BANK

## 2023-09-02 MED ORDER — BORTEZOMIB CHEMO SQ INJECTION 3.5 MG (2.5MG/ML)
1.5000 mg/m2 | Freq: Once | INTRAMUSCULAR | Status: AC
  Administered 2023-09-02: 3 mg via SUBCUTANEOUS
  Filled 2023-09-02: qty 1.2

## 2023-09-02 MED ORDER — PALONOSETRON HCL INJECTION 0.25 MG/5ML
0.2500 mg | Freq: Once | INTRAVENOUS | Status: AC
  Administered 2023-09-02: 0.25 mg via INTRAVENOUS

## 2023-09-02 MED ORDER — SODIUM CHLORIDE 0.9 % IV SOLN: INTRAVENOUS | Status: DC

## 2023-09-02 MED ORDER — DEXAMETHASONE 4 MG PO TABS
40.0000 mg | ORAL_TABLET | Freq: Once | ORAL | Status: DC
Start: 2023-09-02 — End: 2023-09-02

## 2023-09-02 MED ORDER — SODIUM CHLORIDE 0.9 % IV SOLN
500.0000 mg/m2 | Freq: Once | INTRAVENOUS | Status: AC
  Administered 2023-09-02: 1000 mg via INTRAVENOUS
  Filled 2023-09-02: qty 50

## 2023-09-02 MED ORDER — DEXAMETHASONE 4 MG PO TABS: ORAL_TABLET | ORAL | 4 refills | Status: AC

## 2023-09-02 NOTE — Progress Notes (Signed)
 Ook his steroid at 0935 this morning.

## 2023-09-02 NOTE — Progress Notes (Signed)
 Spoke with Drew Cooper in the BB and confirmed blood orders.

## 2023-09-02 NOTE — Patient Instructions (Signed)
 [facility] - A DEPT OF [facility]  Discharge Instructions: Thank you for choosing [facility] to provide your oncology and hematology care.   If you have a lab appointment with the Cancer Center, please go directly to the Cancer Center and check in at the registration area.   Wear comfortable clothing and clothing appropriate for easy access to any Portacath or PICC line.   We strive to give you quality time with your provider. You may need to reschedule your appointment if you arrive late (15 or more minutes).  Arriving late affects you and other patients whose appointments are after yours.  Also, if you miss three or more appointments without notifying the office, you may be dismissed from the clinic at the provider's discretion.      For prescription refill requests, have your pharmacy contact our office and allow 72 hours for refills to be completed.    Today you received the following chemotherapy and/or immunotherapy agents: cytoxan, velcade      To help prevent nausea and vomiting after your treatment, we encourage you to take your nausea medication as directed.  BELOW ARE SYMPTOMS THAT SHOULD BE REPORTED IMMEDIATELY: *FEVER GREATER THAN 100.4 F (38 C) OR HIGHER *CHILLS OR SWEATING *NAUSEA AND VOMITING THAT IS NOT CONTROLLED WITH YOUR NAUSEA MEDICATION *UNUSUAL SHORTNESS OF BREATH *UNUSUAL BRUISING OR BLEEDING *URINARY PROBLEMS (pain or burning when urinating, or frequent urination) *BOWEL PROBLEMS (unusual diarrhea, constipation, pain near the anus) TENDERNESS IN MOUTH AND THROAT WITH OR WITHOUT PRESENCE OF ULCERS (sore throat, sores in mouth, or a toothache) UNUSUAL RASH, SWELLING OR PAIN  UNUSUAL VAGINAL DISCHARGE OR ITCHING   Items with * indicate a potential emergency and should be followed up as soon as possible or go to the Emergency Department if any problems should occur.  Please show the CHEMOTHERAPY ALERT CARD or  IMMUNOTHERAPY ALERT CARD at check-in to the Emergency Department and triage nurse.  Should you have questions after your visit or need to cancel or reschedule your appointment, please contact [facility]  Dept: [phone]  and follow the prompts.  Office hours are 8:00 a.m. to 4:30 p.m. Monday - Friday. Please note that voicemails left after 4:00 p.m. may not be returned until the following business day.  We are closed weekends and major holidays. You have access to a nurse at all times for urgent questions. Please call the main number to the clinic Dept: [phone] and follow the prompts.   For any non-urgent questions, you may also contact your provider using MyChart. We now offer e-Visits for anyone 27 and older to request care online for non-urgent symptoms. For details visit [email or web address].   Also download the MyChart app! Go to the app store, search "MyChart", open the app, select [place], and log in with your MyChart username and password.

## 2023-09-04 ENCOUNTER — Inpatient Hospital Stay

## 2023-09-04 DIAGNOSIS — Z5112 Encounter for antineoplastic immunotherapy: Secondary | ICD-10-CM | POA: Diagnosis not present

## 2023-09-04 DIAGNOSIS — D6481 Anemia due to antineoplastic chemotherapy: Secondary | ICD-10-CM

## 2023-09-04 MED ORDER — ACETAMINOPHEN 325 MG PO TABS
650.0000 mg | ORAL_TABLET | Freq: Once | ORAL | Status: AC
  Administered 2023-09-04: 650 mg via ORAL
  Filled 2023-09-04: qty 2

## 2023-09-04 MED ORDER — SODIUM CHLORIDE 0.9% IV SOLUTION
250.0000 mL | Freq: Once | INTRAVENOUS | Status: AC
  Administered 2023-09-04: 100 mL via INTRAVENOUS

## 2023-09-04 MED ORDER — DIPHENHYDRAMINE HCL 25 MG PO CAPS
25.0000 mg | ORAL_CAPSULE | Freq: Once | ORAL | Status: AC
  Administered 2023-09-04: 25 mg via ORAL
  Filled 2023-09-04: qty 1

## 2023-09-04 NOTE — Patient Instructions (Signed)

## 2023-09-05 LAB — TYPE AND SCREEN
ABO/RH(D): AB POS
Antibody Screen: POSITIVE
Donor AG Type: NEGATIVE
Donor AG Type: NEGATIVE
Unit division: 0
Unit division: 0

## 2023-09-05 LAB — BPAM RBC
Blood Product Expiration Date: 202507042359
Blood Product Expiration Date: 202507042359
ISSUE DATE / TIME: 202506061024
ISSUE DATE / TIME: 202506061024
Unit Type and Rh: 6200
Unit Type and Rh: 6200

## 2023-09-09 ENCOUNTER — Inpatient Hospital Stay

## 2023-09-09 VITALS — BP 142/81 | HR 81 | Temp 98.3°F | Resp 16

## 2023-09-09 DIAGNOSIS — N184 Chronic kidney disease, stage 4 (severe): Secondary | ICD-10-CM

## 2023-09-09 DIAGNOSIS — Z5112 Encounter for antineoplastic immunotherapy: Secondary | ICD-10-CM | POA: Diagnosis not present

## 2023-09-09 DIAGNOSIS — C9000 Multiple myeloma not having achieved remission: Secondary | ICD-10-CM

## 2023-09-09 LAB — CBC WITH DIFFERENTIAL (CANCER CENTER ONLY)
Abs Immature Granulocytes: 0.01 10*3/uL (ref 0.00–0.07)
Basophils Absolute: 0.1 10*3/uL (ref 0.0–0.1)
Basophils Relative: 2 %
Eosinophils Absolute: 0.1 10*3/uL (ref 0.0–0.5)
Eosinophils Relative: 2 %
HCT: 25.5 % — ABNORMAL LOW (ref 39.0–52.0)
Hemoglobin: 8.7 g/dL — ABNORMAL LOW (ref 13.0–17.0)
Immature Granulocytes: 0 %
Lymphocytes Relative: 8 %
Lymphs Abs: 0.4 10*3/uL — ABNORMAL LOW (ref 0.7–4.0)
MCH: 30.7 pg (ref 26.0–34.0)
MCHC: 34.1 g/dL (ref 30.0–36.0)
MCV: 90.1 fL (ref 80.0–100.0)
Monocytes Absolute: 0.3 10*3/uL (ref 0.1–1.0)
Monocytes Relative: 7 %
Neutro Abs: 3.3 10*3/uL (ref 1.7–7.7)
Neutrophils Relative %: 81 %
Platelet Count: 116 10*3/uL — ABNORMAL LOW (ref 150–400)
RBC: 2.83 MIL/uL — ABNORMAL LOW (ref 4.22–5.81)
RDW: 19.4 % — ABNORMAL HIGH (ref 11.5–15.5)
WBC Count: 4.2 10*3/uL (ref 4.0–10.5)
nRBC: 0 % (ref 0.0–0.2)

## 2023-09-09 LAB — SAMPLE TO BLOOD BANK

## 2023-09-09 LAB — CMP (CANCER CENTER ONLY)
ALT: 13 U/L (ref 0–44)
AST: 21 U/L (ref 15–41)
Albumin: 4.1 g/dL (ref 3.5–5.0)
Alkaline Phosphatase: 56 U/L (ref 38–126)
Anion gap: 7 (ref 5–15)
BUN: 34 mg/dL — ABNORMAL HIGH (ref 6–20)
CO2: 32 mmol/L (ref 22–32)
Calcium: 9.4 mg/dL (ref 8.9–10.3)
Chloride: 98 mmol/L (ref 98–111)
Creatinine: 5.51 mg/dL — ABNORMAL HIGH (ref 0.61–1.24)
GFR, Estimated: 11 mL/min — ABNORMAL LOW (ref >60)
Glucose, Bld: 98 mg/dL (ref 70–99)
Potassium: 4 mmol/L (ref 3.5–5.1)
Sodium: 137 mmol/L (ref 135–145)
Total Bilirubin: 0.4 mg/dL (ref 0.0–1.2)
Total Protein: 6.4 g/dL — ABNORMAL LOW (ref 6.5–8.1)

## 2023-09-09 MED ORDER — BORTEZOMIB CHEMO SQ INJECTION 3.5 MG (2.5MG/ML)
1.5000 mg/m2 | Freq: Once | INTRAMUSCULAR | Status: AC
  Administered 2023-09-09: 3 mg via SUBCUTANEOUS
  Filled 2023-09-09: qty 1.2

## 2023-09-09 MED ORDER — PALONOSETRON HCL INJECTION 0.25 MG/5ML
0.2500 mg | Freq: Once | INTRAVENOUS | Status: AC
  Administered 2023-09-09: 0.25 mg via INTRAVENOUS
  Filled 2023-09-09: qty 5

## 2023-09-09 MED ORDER — SODIUM CHLORIDE 0.9 % IV SOLN
500.0000 mg/m2 | Freq: Once | INTRAVENOUS | Status: AC
  Administered 2023-09-09: 1000 mg via INTRAVENOUS
  Filled 2023-09-09: qty 50

## 2023-09-09 MED ORDER — DEXAMETHASONE 4 MG PO TABS
40.0000 mg | ORAL_TABLET | Freq: Once | ORAL | Status: AC
  Administered 2023-09-09: 40 mg via ORAL
  Filled 2023-09-09: qty 10

## 2023-09-09 NOTE — Progress Notes (Signed)
 Pt reports he did NOT take Decadron  at home today. Will give in clinic.

## 2023-09-09 NOTE — Patient Instructions (Signed)
 [facility]  Discharge Instructions: Thank you for choosing [facility] to provide your oncology and hematology care.   If you have a lab appointment with the Cancer Center, please go directly to the Cancer Center and check in at the registration area.   Wear comfortable clothing and clothing appropriate for easy access to any Portacath or PICC line.   We strive to give you quality time with your provider. You may need to reschedule your appointment if you arrive late (15 or more minutes).  Arriving late affects you and other patients whose appointments are after yours.  Also, if you miss three or more appointments without notifying the office, you may be dismissed from the clinic at the provider's discretion.      For prescription refill requests, have your pharmacy contact our office and allow 72 hours for refills to be completed.    Today you received the following chemotherapy and/or immunotherapy agents: bortezomib and cyclophosphamide      To help prevent nausea and vomiting after your treatment, we encourage you to take your nausea medication as directed.  BELOW ARE SYMPTOMS THAT SHOULD BE REPORTED IMMEDIATELY: *FEVER GREATER THAN 100.4 F (38 C) OR HIGHER *CHILLS OR SWEATING *NAUSEA AND VOMITING THAT IS NOT CONTROLLED WITH YOUR NAUSEA MEDICATION *UNUSUAL SHORTNESS OF BREATH *UNUSUAL BRUISING OR BLEEDING *URINARY PROBLEMS (pain or burning when urinating, or frequent urination) *BOWEL PROBLEMS (unusual diarrhea, constipation, pain near the anus) TENDERNESS IN MOUTH AND THROAT WITH OR WITHOUT PRESENCE OF ULCERS (sore throat, sores in mouth, or a toothache) UNUSUAL RASH, SWELLING OR PAIN  UNUSUAL VAGINAL DISCHARGE OR ITCHING   Items with * indicate a potential emergency and should be followed up as soon as possible or go to the Emergency Department if any problems should occur.  Please show the CHEMOTHERAPY ALERT  CARD or IMMUNOTHERAPY ALERT CARD at check-in to the Emergency Department and triage nurse.  Should you have questions after your visit or need to cancel or reschedule your appointment, please contact [facility]  Dept: [phone]  and follow the prompts.  Office hours are 8:00 a.m. to 4:30 p.m. Monday - Friday. Please note that voicemails left after 4:00 p.m. may not be returned until the following business day.  We are closed weekends and major holidays. You have access to a nurse at all times for urgent questions. Please call the main number to the clinic Dept: [phone] and follow the prompts.   For any non-urgent questions, you may also contact your provider using MyChart. We now offer e-Visits for anyone 31 and older to request care online for non-urgent symptoms. For details visit [email or web address].   Also download the MyChart app! Go to the app store, search "MyChart", open the app, select [place], and log in with your MyChart username and password.

## 2023-09-16 ENCOUNTER — Other Ambulatory Visit: Payer: Self-pay

## 2023-09-23 ENCOUNTER — Inpatient Hospital Stay

## 2023-09-23 ENCOUNTER — Inpatient Hospital Stay (HOSPITAL_BASED_OUTPATIENT_CLINIC_OR_DEPARTMENT_OTHER): Admitting: Internal Medicine

## 2023-09-23 ENCOUNTER — Other Ambulatory Visit: Payer: Self-pay | Admitting: Medical Oncology

## 2023-09-23 VITALS — BP 125/63 | HR 81 | Temp 98.6°F | Resp 16 | Ht 70.0 in | Wt 177.2 lb

## 2023-09-23 DIAGNOSIS — N184 Chronic kidney disease, stage 4 (severe): Secondary | ICD-10-CM

## 2023-09-23 DIAGNOSIS — Z5112 Encounter for antineoplastic immunotherapy: Secondary | ICD-10-CM | POA: Diagnosis not present

## 2023-09-23 DIAGNOSIS — C9000 Multiple myeloma not having achieved remission: Secondary | ICD-10-CM

## 2023-09-23 LAB — CBC WITH DIFFERENTIAL (CANCER CENTER ONLY)
Abs Immature Granulocytes: 0.01 10*3/uL (ref 0.00–0.07)
Basophils Absolute: 0.1 10*3/uL (ref 0.0–0.1)
Basophils Relative: 2 %
Eosinophils Absolute: 0.1 10*3/uL (ref 0.0–0.5)
Eosinophils Relative: 3 %
HCT: 22.4 % — ABNORMAL LOW (ref 39.0–52.0)
Hemoglobin: 7.6 g/dL — ABNORMAL LOW (ref 13.0–17.0)
Immature Granulocytes: 0 %
Lymphocytes Relative: 10 %
Lymphs Abs: 0.3 10*3/uL — ABNORMAL LOW (ref 0.7–4.0)
MCH: 31.3 pg (ref 26.0–34.0)
MCHC: 33.9 g/dL (ref 30.0–36.0)
MCV: 92.2 fL (ref 80.0–100.0)
Monocytes Absolute: 0.4 10*3/uL (ref 0.1–1.0)
Monocytes Relative: 13 %
Neutro Abs: 2.3 10*3/uL (ref 1.7–7.7)
Neutrophils Relative %: 72 %
Platelet Count: 122 10*3/uL — ABNORMAL LOW (ref 150–400)
RBC: 2.43 MIL/uL — ABNORMAL LOW (ref 4.22–5.81)
RDW: 21 % — ABNORMAL HIGH (ref 11.5–15.5)
WBC Count: 3.2 10*3/uL — ABNORMAL LOW (ref 4.0–10.5)
nRBC: 0 % (ref 0.0–0.2)

## 2023-09-23 LAB — SAMPLE TO BLOOD BANK

## 2023-09-23 MED ORDER — ACETAMINOPHEN 325 MG PO TABS
650.0000 mg | ORAL_TABLET | Freq: Once | ORAL | Status: AC
  Administered 2023-09-23: 650 mg via ORAL
  Filled 2023-09-23: qty 2

## 2023-09-23 MED ORDER — DIPHENHYDRAMINE HCL 25 MG PO CAPS
50.0000 mg | ORAL_CAPSULE | Freq: Once | ORAL | Status: AC
  Administered 2023-09-23: 50 mg via ORAL
  Filled 2023-09-23: qty 2

## 2023-09-23 MED ORDER — DARATUMUMAB-HYALURONIDASE-FIHJ 1800-30000 MG-UT/15ML ~~LOC~~ SOLN
1800.0000 mg | Freq: Once | SUBCUTANEOUS | Status: AC
  Administered 2023-09-23: 1800 mg via SUBCUTANEOUS
  Filled 2023-09-23: qty 15

## 2023-09-23 MED ORDER — DEXAMETHASONE 4 MG PO TABS: 40.0000 mg | ORAL_TABLET | Freq: Once | ORAL | Status: DC

## 2023-09-23 NOTE — Addendum Note (Signed)
 Addended by: CAROLEE LOA DEL on: 09/23/2023 09:18 AM   Modules accepted: Orders

## 2023-09-23 NOTE — Progress Notes (Signed)
 [facility] [phone]   [phone]  OFFICE PROGRESS NOTE  Drew Song, MD [address] [place] [address]  DIAGNOSIS: Recently diagnosed with multiple myeloma with lytic bone lesions in addition to renal insufficiency and bone marrow biopsy and aspirate consistent with plasma cell neoplasm with 53% plasma cells. This was diagnosed in August 2024.    PRIOR THERAPY:  Plasmapheresis status post ~4 treatments    CURRENT THERAPY: Systemic chemotherapy with subcutaneous Velcade  1.3 Mg/KG on days 1, 4, 8 and 11 every 3 weeks, cyclophosphamide  500 Mg/M2 IV on days 1 and 8 as well as Decadron  40 mg p.o. on weekly basis start with the first dose of Velcade .  He is status post 2 cycles.  Once his renal function improves, Dr. Sherrod will likely recommend changing treatment to standard treatment with daratumumab , Velcade , Revlimid and Decadron  for 8 cycles followed by evaluation for autologous stem cell transplant.  He is status post 5 cycles of the new regimen with daratumumab  subcutaneously, cyclophosphamide , subcutaneous Velcade  and dexamethasone  every 4 weeks   INTERVAL HISTORY: Drew Cooper 58 y.o. male returns to the clinic today for follow-up visit. Discussed the use of AI scribe software for clinical note transcription with the patient, who gave verbal consent to proceed.  History of Present Illness   Drew Cooper is a 58 year old male with multiple myeloma who presents for evaluation before starting cycle nine of his treatment.  He was diagnosed with multiple myeloma in August 2024 and is currently undergoing treatment with daratumumab , Velcade , cyclophosphamide , and Decadron . He has completed eight cycles of treatment.  He feels 'about the same' with no new symptoms, although he experiences aches and pains associated with his condition. No weight loss has been noted.  He has a history of renal insufficiency.  He is  considering a stem cell transplant and is in the process of scheduling the necessary procedures, including stem cell collection.       MEDICAL HISTORY: Past Medical History:  Diagnosis Date   Cancer (HCC) 10/2022   multiple myeloma   Chronic kidney disease     ALLERGIES:  has no known allergies.  MEDICATIONS:  Current Outpatient Medications  Medication Sig Dispense Refill   acetaminophen  (TYLENOL ) 500 MG tablet Take 500 mg by mouth every 6 (six) hours as needed for moderate pain.     acyclovir  (ZOVIRAX ) 200 MG capsule Take 1 capsule (200 mg total) by mouth 2 (two) times daily. 60 capsule 2   B Complex-C-Folic Acid (RENA-VITE RX) 1 MG TABS Take 1 tablet by mouth.     Calcium  Carbonate (CALCIUM  600 PO) Take 600 mg by mouth 2 (two) times daily.     DARZALEX  FASPRO 1800-30000 MG-UT/15ML SOLN Inject into the skin.     dexamethasone  (DECADRON ) 4 MG tablet Take 10 tablets by mouth once a week. 40 tablet 4   gabapentin  (NEURONTIN ) 100 MG capsule TAKE ONE CAPSULE BY MOUTH THREE TIMES DAILY 90 capsule 1   lidocaine -prilocaine (EMLA) cream APPLY a thin AMOUNT TO TO THE ARM ONE hrr prior TO dialysis three times PER WEEK     oxyCODONE  (OXY IR/ROXICODONE ) 5 MG immediate release tablet Take 1 tablet (5 mg total) by mouth every 6 (six) hours as needed for moderate pain (pain score 4-6) (for pain score of 1-4). (Patient not taking: Reported on 05/04/2023) 12 tablet 0   prochlorperazine  (COMPAZINE ) 10 MG tablet Take 1 tablet (10 mg total) by  mouth every 6 (six) hours as needed. 30 tablet 2   sevelamer carbonate (RENVELA) 800 MG tablet Take 800 mg by mouth 3 (three) times daily.     No current facility-administered medications for this visit.    SURGICAL HISTORY:  Past Surgical History:  Procedure Laterality Date   AV FISTULA PLACEMENT Left 03/26/2023   Procedure: LEFT ARM FISTULA CREATION;  Surgeon: Drew Gaile ORN, MD;  Location: [facility] OR;  Service: Vascular;  Laterality: Left;   IR FLUORO GUIDE CV  LINE RIGHT  11/13/2022   IR US  GUIDE VASC ACCESS RIGHT  11/13/2022   KNEE SURGERY      REVIEW OF SYSTEMS:  A comprehensive review of systems was negative except for: Constitutional: positive for fatigue   PHYSICAL EXAMINATION: General appearance: alert, cooperative, fatigued, and no distress Head: Normocephalic, without obvious abnormality, atraumatic Neck: no adenopathy, no JVD, supple, symmetrical, trachea midline, and thyroid not enlarged, symmetric, no tenderness/mass/nodules Lymph nodes: Cervical, supraclavicular, and axillary nodes normal. Resp: clear to auscultation bilaterally Back: symmetric, no curvature. ROM normal. No CVA tenderness. Cardio: regular rate and rhythm, S1, S2 normal, no murmur, click, rub or gallop GI: soft, non-tender; bowel sounds normal; no masses,  no organomegaly Extremities: extremities normal, atraumatic, no cyanosis or edema  ECOG PERFORMANCE STATUS: 1 - Symptomatic but completely ambulatory  Blood pressure 125/63, pulse 81, temperature 98.6 F (37 C), temperature source Oral, resp. rate 16, height 5' 10 (1.778 m), weight 177 lb 3.2 oz (80.4 kg), SpO2 100%.  LABORATORY DATA: Lab Results  Component Value Date   WBC 3.2 (L) 09/23/2023   HGB 7.6 (L) 09/23/2023   HCT 22.4 (L) 09/23/2023   MCV 92.2 09/23/2023   PLT 122 (L) 09/23/2023      Chemistry      Component Value Date/Time   NA 137 09/09/2023 0949   K 4.0 09/09/2023 0949   CL 98 09/09/2023 0949   CO2 32 09/09/2023 0949   BUN 34 (H) 09/09/2023 0949   CREATININE 5.51 (H) 09/09/2023 0949      Component Value Date/Time   CALCIUM  9.4 09/09/2023 0949   ALKPHOS 56 09/09/2023 0949   AST 21 09/09/2023 0949   ALT 13 09/09/2023 0949   BILITOT 0.4 09/09/2023 0949       RADIOGRAPHIC STUDIES: No results found.   ASSESSMENT AND PLAN: This is a very pleasant 58 years old white male with multiple myeloma with lytic bone lesions in addition to renal insufficiency and bone marrow biopsy and  aspirate consistent with plasma cell neoplasm with 53% plasma cells. This was diagnosed in August 2024. He is currently undergoing systemic chemotherapy with subcutaneous Velcade  1.3 Mg/KG on days 1, 4, 8 and 11 every 3 weeks, cyclophosphamide  500 Mg/M2 IV on days 1 and 8 as well as Decadron  40 mg p.o. on weekly basis start with the first dose of Velcade .  He is status post 3 cycles.  Unfortunately no improvement in his renal function so far He was referred to Dr. Fernande at [facility] who recommended adding daratumumab  to his current regimen with the hope for improvement of his renal function.  He is status post 8 cycles of the new regimen.  He has been tolerating this treatment fairly well except for the fatigue. Assessment and Plan    Multiple myeloma Diagnosed in August 2024, currently undergoing treatment with daratumumab , Velcade , cyclophosphamide , and Decadron . He is post eight cycles and preparing for cycle nine. Potential stem cell transplant at [facility] discussed,  but not yet scheduled. Stem cell collection pending. Logistics for transplant, including financial and scheduling considerations, are being planned. - Continue treatment with daratumumab , Velcade , cyclophosphamide , and Decadron . - Coordinate with transplant team for stem cell collection and transplant scheduling.  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 - Administer one unit of blood transfusion this week.  Chronic kidney disease, unspecified Likely related to multiple myeloma.  Follow-up - Schedule follow-up appointment in three weeks.   The patient was advised to call immediately if he has any concerning symptoms in the interval.  The patient voices understanding of current disease status and treatment options and is in agreement with the current care plan.  All questions were answered. The patient knows to call the clinic with  any problems, questions or concerns. We can certainly see the patient much sooner if necessary.  The total time spent in the appointment was 20 minutes.  Disclaimer: This note was dictated with voice recognition software. Similar sounding words can inadvertently be transcribed and may not be corrected upon review.

## 2023-09-23 NOTE — Patient Instructions (Signed)

## 2023-09-26 ENCOUNTER — Inpatient Hospital Stay

## 2023-09-26 ENCOUNTER — Other Ambulatory Visit: Payer: Self-pay

## 2023-09-26 VITALS — BP 147/73 | HR 97 | Temp 98.1°F | Resp 20

## 2023-09-26 DIAGNOSIS — T451X5A Adverse effect of antineoplastic and immunosuppressive drugs, initial encounter: Secondary | ICD-10-CM

## 2023-09-26 DIAGNOSIS — C9000 Multiple myeloma not having achieved remission: Secondary | ICD-10-CM

## 2023-09-26 DIAGNOSIS — D649 Anemia, unspecified: Secondary | ICD-10-CM

## 2023-09-26 DIAGNOSIS — Z5112 Encounter for antineoplastic immunotherapy: Secondary | ICD-10-CM | POA: Diagnosis not present

## 2023-09-26 DIAGNOSIS — D631 Anemia in chronic kidney disease: Secondary | ICD-10-CM

## 2023-09-26 MED ORDER — SODIUM CHLORIDE 0.9% IV SOLUTION: 250.0000 mL | INTRAVENOUS | Status: DC

## 2023-09-26 MED ORDER — DIPHENHYDRAMINE HCL 25 MG PO CAPS
25.0000 mg | ORAL_CAPSULE | Freq: Once | ORAL | Status: AC
  Administered 2023-09-26: 25 mg via ORAL
  Filled 2023-09-26 (×2): qty 1

## 2023-09-26 MED ORDER — ACETAMINOPHEN 325 MG PO TABS
650.0000 mg | ORAL_TABLET | Freq: Once | ORAL | Status: AC
  Administered 2023-09-26: 650 mg via ORAL
  Filled 2023-09-26 (×2): qty 2

## 2023-09-26 NOTE — Progress Notes (Signed)
 When patient arrived at 1030 it was noted that his type and screen was still held from the draw date of 6/25. No blood ready. This is the last day this sample match is good and patient is very frustrated and wants/needs his blood today. Safety zone done and Melanie RN aware that patient is patiently waiting - it is 1310 and a crossmatched blood bag is not ready still at this time. BB is working on it.   At 1345 BB notified us  the first bag was found not compatible so they would not have one ready for at least another hour. Patient stated he was ready to go. We drew a new type and screen now and sent him home with a new bracelet. Monday 6/30 charge RN will call him to schedule him for a transfusion and he is aware. Thanked him for his patience and apologized for the outcome.

## 2023-09-28 ENCOUNTER — Inpatient Hospital Stay

## 2023-09-28 ENCOUNTER — Other Ambulatory Visit: Payer: Self-pay

## 2023-09-28 DIAGNOSIS — Z5112 Encounter for antineoplastic immunotherapy: Secondary | ICD-10-CM | POA: Diagnosis not present

## 2023-09-28 DIAGNOSIS — C9000 Multiple myeloma not having achieved remission: Secondary | ICD-10-CM

## 2023-09-28 LAB — PREPARE RBC (CROSSMATCH)

## 2023-09-28 MED ORDER — DIPHENHYDRAMINE HCL 25 MG PO CAPS
25.0000 mg | ORAL_CAPSULE | Freq: Once | ORAL | Status: AC
  Administered 2023-09-28: 25 mg via ORAL
  Filled 2023-09-28: qty 1

## 2023-09-28 MED ORDER — SODIUM CHLORIDE 0.9% IV SOLUTION
250.0000 mL | INTRAVENOUS | Status: DC
  Administered 2023-09-28: 100 mL via INTRAVENOUS

## 2023-09-28 MED ORDER — ACETAMINOPHEN 325 MG PO TABS
650.0000 mg | ORAL_TABLET | Freq: Once | ORAL | Status: AC
  Administered 2023-09-28: 650 mg via ORAL
  Filled 2023-09-28: qty 2

## 2023-09-28 NOTE — Progress Notes (Signed)
 Spoke with patient today regarding scheduled appointment. Patient stated he is able to arrive at the cancer center around 10:00 AM. Charge nurse was informed of updated arrival time.  Released Prepare for Blood Products order and contacted Kelly in the blood bank to confirm blood product orders.

## 2023-09-29 LAB — TYPE AND SCREEN
ABO/RH(D): AB POS
ABO/RH(D): AB POS
Antibody Screen: POSITIVE
Antibody Screen: POSITIVE
Unit division: 0

## 2023-09-29 LAB — PREPARE RBC (CROSSMATCH)

## 2023-09-29 LAB — BPAM RBC
Blood Product Expiration Date: 202507262359
ISSUE DATE / TIME: 202506301110
Unit Type and Rh: 6200

## 2023-10-14 ENCOUNTER — Other Ambulatory Visit: Payer: Self-pay

## 2023-10-16 NOTE — Progress Notes (Signed)
 [facility] OFFICE PROGRESS NOTE  [name], MD [address] [place] [address]  DIAGNOSIS: Multiple myeloma with lytic bone lesions in addition to renal insufficiency and bone marrow biopsy and aspirate consistent with plasma cell neoplasm with 53% plasma cells. This was diagnosed in August 2024.   PRIOR THERAPY: Plasmapheresis status post ~4 treatments   CURRENT THERAPY: First dose of treatment given while inpatient with  subcutaneous Velcade  1.3 Mg/KG on days 1, 4, 8 and 11 every 3 weeks, cyclophosphamide  500 Mg/M2 IV on days 1 and 8 as well as Decadron  40 mg p.o. on weekly basis start with the first dose of Velcade . Dr. Sherrod added daratumumab . He is here for day 1 cycle #4 with Daratumumab , cytoxan , velcade , and dexamethasone .   INTERVAL HISTORY: Drew Cooper 58 y.o. male returns to the clinic today for a follow-up visit. The patient was last seen by Dr. Sherrod on 09/23/23.    The patient is currently undergoing treatment for multiple myeloma. He tolerates this well without any concerning adverse side effects.   He is currently undergoing treatment for multiple myeloma and is coordinating a potential transplant with [facility] and [facility].  He needs to call them back today to help coordinate preparation.  He recently had a blood transfusion a couple of weeks ago and feels 'okay' since then. He is taking Decadron , a steroid, at a dose of 40 mg, and confirms taking it as prescribed. His energy levels are described as 'pretty good,' although he notes that the ongoing treatment can be tiring.  No recent fevers, chills, night sweats, or unintentional weight loss. No issues with breathing, chest pain, unusual shortness of breath, or cough. He denies any nausea, vomiting, diarrhea, constipation, or signs of infection such as skin infections or burning with urination. He also denies any abnormal bleeding or bruising, although he notes minor bleeding if he scrapes  his arm.  He has been taking calcium  supplements twice daily.   He denies any unusual bone pain. He is here today for evaluation repeat blood work before undergoing day 1 cycle 10.     MEDICAL HISTORY: Past Medical History:  Diagnosis Date   Cancer (HCC) 10/2022   multiple myeloma   Chronic kidney disease     ALLERGIES:  has no known allergies.  MEDICATIONS:  Current Outpatient Medications  Medication Sig Dispense Refill   acetaminophen  (TYLENOL ) 500 MG tablet Take 500 mg by mouth every 6 (six) hours as needed for moderate pain.     acyclovir  (ZOVIRAX ) 200 MG capsule Take 1 capsule (200 mg total) by mouth 2 (two) times daily. 60 capsule 2   B Complex-C-Folic Acid (RENA-VITE RX) 1 MG TABS Take 1 tablet by mouth.     Calcium  Carbonate (CALCIUM  600 PO) Take 600 mg by mouth 2 (two) times daily.     DARZALEX  FASPRO 1800-30000 MG-UT/15ML SOLN Inject into the skin.     dexamethasone  (DECADRON ) 4 MG tablet Take 10 tablets by mouth once a week. 40 tablet 4   gabapentin  (NEURONTIN ) 100 MG capsule TAKE ONE CAPSULE BY MOUTH THREE TIMES DAILY 90 capsule 1   lidocaine -prilocaine (EMLA) cream APPLY a thin AMOUNT TO TO THE ARM ONE hrr prior TO dialysis three times PER WEEK     oxyCODONE  (OXY IR/ROXICODONE ) 5 MG immediate release tablet Take 1 tablet (5 mg total) by mouth every 6 (six) hours as needed for moderate pain (pain score 4-6) (for pain score of 1-4). (Patient not taking:  Reported on 05/04/2023) 12 tablet 0   prochlorperazine  (COMPAZINE ) 10 MG tablet Take 1 tablet (10 mg total) by mouth every 6 (six) hours as needed. 30 tablet 2   sevelamer carbonate (RENVELA) 800 MG tablet Take 800 mg by mouth 3 (three) times daily.     No current facility-administered medications for this visit.    SURGICAL HISTORY:  Past Surgical History:  Procedure Laterality Date   AV FISTULA PLACEMENT Left 03/26/2023   Procedure: LEFT ARM FISTULA CREATION;  Surgeon: Serene Gaile ORN, MD;  Location: [facility] OR;   Service: Vascular;  Laterality: Left;   IR FLUORO GUIDE CV LINE RIGHT  11/13/2022   IR US  GUIDE VASC ACCESS RIGHT  11/13/2022   KNEE SURGERY      REVIEW OF SYSTEMS:   Review of Systems  Constitutional: Stable fatigue. Negative for appetite change, chills, fatigue, fever and unexpected weight change.  HENT: Negative for mouth sores, nosebleeds, sore throat and trouble swallowing.   Eyes: Negative for eye problems and icterus.  Respiratory: Negative for cough, hemoptysis, shortness of breath and wheezing.   Cardiovascular: Negative for chest pain and leg swelling.  Gastrointestinal: Negative for abdominal pain, constipation, diarrhea, nausea and vomiting.  Genitourinary: Negative for bladder incontinence, difficulty urinating, dysuria, frequency and hematuria.   Musculoskeletal: Negative for back pain, gait problem, neck pain and neck stiffness.  Skin: Negative for itching and rash.  Neurological: Negative for dizziness, extremity weakness, gait problem, headaches, light-headedness and seizures.  Hematological: Negative for adenopathy. Does not bruise/bleed easily.  Psychiatric/Behavioral: Negative for confusion, depression and sleep disturbance. The patient is not nervous/anxious.     PHYSICAL EXAMINATION:  There were no vitals taken for this visit.  ECOG PERFORMANCE STATUS: 1  Physical Exam  Constitutional: Oriented to person, place, and time and well-developed, well-nourished, and in no distress.  HENT:  Head: Normocephalic and atraumatic.  Mouth/Throat: Oropharynx is clear and moist. No oropharyngeal exudate.  Eyes: Conjunctivae are normal. Right eye exhibits no discharge. Left eye exhibits no discharge. No scleral icterus.  Neck: Normal range of motion. Neck supple.  Cardiovascular: Normal rate, regular rhythm, normal heart sounds and intact distal pulses.   Pulmonary/Chest: Effort normal and breath sounds normal. No respiratory distress. No wheezes. No rales.  Abdominal: Soft.  Bowel sounds are normal. Exhibits no distension and no mass. There is no tenderness.  Musculoskeletal: Normal range of motion. Exhibits no edema.  Lymphadenopathy:    No cervical adenopathy.  Neurological: Alert and oriented to person, place, and time. Exhibits normal muscle tone. Gait normal. Coordination normal.  Skin: Skin is warm and dry. Positive for upper extremity bruising. No rash noted. Not diaphoretic. No erythema. No pallor.  Psychiatric: Mood, memory and judgment normal.  Vitals reviewed.  LABORATORY DATA: Lab Results  Component Value Date   WBC 3.2 (L) 09/23/2023   HGB 7.6 (L) 09/23/2023   HCT 22.4 (L) 09/23/2023   MCV 92.2 09/23/2023   PLT 122 (L) 09/23/2023      Chemistry      Component Value Date/Time   NA 137 09/09/2023 0949   K 4.0 09/09/2023 0949   CL 98 09/09/2023 0949   CO2 32 09/09/2023 0949   BUN 34 (H) 09/09/2023 0949   CREATININE 5.51 (H) 09/09/2023 0949      Component Value Date/Time   CALCIUM  9.4 09/09/2023 0949   ALKPHOS 56 09/09/2023 0949   AST 21 09/09/2023 0949   ALT 13 09/09/2023 0949   BILITOT 0.4 09/09/2023 0949  RADIOGRAPHIC STUDIES:  No results found.   ASSESSMENT/PLAN:  This is a very pleasant 58 year old Caucasian male diagnosed with multiple myeloma with 53% plasma cells on bone marrow biopsy and aspirate.   The patient is here to establish care on an outpatient basis.  He was recently hospitalized in August 2020 for which showed his diagnosis of multiple myeloma.  While admitted to the hospital he underwent day 1 cycle 1.   He rstarted treatment with subcutaneous Velcade  1.3 Mg/KG on days 1, 4, 8 and 11 every 3 weeks, cyclophosphamide  500 Mg/M2 IV on days 1 and 8 as well as Decadron  40 mg p.o. on weekly basis start with the first dose of Velcade  on 11/14/22.  In November 2024, daratumumab  was added.     His hemoglobin is 7.9 today.  His creatinine is 5.31.  He is okay to proceed with day 1 cycle 10 today scheduled.    Asked that he please reach out to [facility] to coordinate possible transplant so we know the timeline and instructions of when to stop his local treatment.  I talked to Dr. Sherrod who recommended continuing his treatment for now until we receive further instructions.  His repeat myeloma labs are pending for today.  We will arrange for 1 unit of blood later this week.   Hypocalcemia Mild hypocalcemia noted despite supplementation. - Continue current calcium  supplementation. - Increase dietary calcium  intake, particularly through dairy products.  The patient was advised to call immediately if she has any concerning symptoms in the interval. The patient voices understanding of current disease status and treatment options and is in agreement with the current care plan. All questions were answered. The patient knows to call the clinic with any problems, questions or concerns. We can certainly see the patient much sooner if necessary    No orders of the defined types were placed in this encounter.    The total time spent in the appointment was 20-29 minutes  Askari Kinley L Can Lucci, PA-C 10/16/23

## 2023-10-21 ENCOUNTER — Inpatient Hospital Stay (HOSPITAL_BASED_OUTPATIENT_CLINIC_OR_DEPARTMENT_OTHER): Admitting: Physician Assistant

## 2023-10-21 ENCOUNTER — Inpatient Hospital Stay

## 2023-10-21 ENCOUNTER — Other Ambulatory Visit: Payer: Self-pay | Admitting: Physician Assistant

## 2023-10-21 ENCOUNTER — Inpatient Hospital Stay: Admitting: Licensed Clinical Social Worker

## 2023-10-21 ENCOUNTER — Inpatient Hospital Stay: Attending: Internal Medicine

## 2023-10-21 VITALS — BP 130/76 | HR 85 | Temp 97.4°F | Resp 18 | Ht 70.0 in | Wt 178.1 lb

## 2023-10-21 DIAGNOSIS — C9000 Multiple myeloma not having achieved remission: Secondary | ICD-10-CM | POA: Insufficient documentation

## 2023-10-21 DIAGNOSIS — T451X5A Adverse effect of antineoplastic and immunosuppressive drugs, initial encounter: Secondary | ICD-10-CM

## 2023-10-21 DIAGNOSIS — Z5112 Encounter for antineoplastic immunotherapy: Secondary | ICD-10-CM | POA: Insufficient documentation

## 2023-10-21 DIAGNOSIS — D6481 Anemia due to antineoplastic chemotherapy: Secondary | ICD-10-CM | POA: Diagnosis not present

## 2023-10-21 LAB — CMP (CANCER CENTER ONLY)
ALT: 12 U/L (ref 0–44)
AST: 19 U/L (ref 15–41)
Albumin: 3.8 g/dL (ref 3.5–5.0)
Alkaline Phosphatase: 53 U/L (ref 38–126)
Anion gap: 10 (ref 5–15)
BUN: 27 mg/dL — ABNORMAL HIGH (ref 6–20)
CO2: 31 mmol/L (ref 22–32)
Calcium: 8.5 mg/dL — ABNORMAL LOW (ref 8.9–10.3)
Chloride: 94 mmol/L — ABNORMAL LOW (ref 98–111)
Creatinine: 5.31 mg/dL — ABNORMAL HIGH (ref 0.61–1.24)
GFR, Estimated: 12 mL/min — ABNORMAL LOW (ref >60)
Glucose, Bld: 114 mg/dL — ABNORMAL HIGH (ref 70–99)
Potassium: 3.3 mmol/L — ABNORMAL LOW (ref 3.5–5.1)
Sodium: 135 mmol/L (ref 135–145)
Total Bilirubin: 0.3 mg/dL (ref 0.0–1.2)
Total Protein: 6 g/dL — ABNORMAL LOW (ref 6.5–8.1)

## 2023-10-21 LAB — CBC WITH DIFFERENTIAL (CANCER CENTER ONLY)
Abs Immature Granulocytes: 0.03 K/uL (ref 0.00–0.07)
Basophils Absolute: 0.1 K/uL (ref 0.0–0.1)
Basophils Relative: 1 %
Eosinophils Absolute: 0.2 K/uL (ref 0.0–0.5)
Eosinophils Relative: 3 %
HCT: 23.3 % — ABNORMAL LOW (ref 39.0–52.0)
Hemoglobin: 7.9 g/dL — ABNORMAL LOW (ref 13.0–17.0)
Immature Granulocytes: 0 %
Lymphocytes Relative: 8 %
Lymphs Abs: 0.6 K/uL — ABNORMAL LOW (ref 0.7–4.0)
MCH: 32.5 pg (ref 26.0–34.0)
MCHC: 33.9 g/dL (ref 30.0–36.0)
MCV: 95.9 fL (ref 80.0–100.0)
Monocytes Absolute: 0.6 K/uL (ref 0.1–1.0)
Monocytes Relative: 8 %
Neutro Abs: 5.4 K/uL (ref 1.7–7.7)
Neutrophils Relative %: 80 %
Platelet Count: 154 K/uL (ref 150–400)
RBC: 2.43 MIL/uL — ABNORMAL LOW (ref 4.22–5.81)
Smear Review: NORMAL
WBC Count: 6.8 K/uL (ref 4.0–10.5)
nRBC: 0 % (ref 0.0–0.2)

## 2023-10-21 LAB — SAMPLE TO BLOOD BANK

## 2023-10-21 LAB — LACTATE DEHYDROGENASE: LDH: 137 U/L (ref 98–192)

## 2023-10-21 MED ORDER — DARATUMUMAB-HYALURONIDASE-FIHJ 1800-30000 MG-UT/15ML ~~LOC~~ SOLN
1800.0000 mg | Freq: Once | SUBCUTANEOUS | Status: AC
  Administered 2023-10-21: 1800 mg via SUBCUTANEOUS
  Filled 2023-10-21: qty 15

## 2023-10-21 MED ORDER — DIPHENHYDRAMINE HCL 25 MG PO CAPS
50.0000 mg | ORAL_CAPSULE | Freq: Once | ORAL | Status: AC
  Administered 2023-10-21: 50 mg via ORAL
  Filled 2023-10-21: qty 2

## 2023-10-21 MED ORDER — ACETAMINOPHEN 325 MG PO TABS
650.0000 mg | ORAL_TABLET | Freq: Once | ORAL | Status: AC
  Administered 2023-10-21: 650 mg via ORAL
  Filled 2023-10-21: qty 2

## 2023-10-21 NOTE — Progress Notes (Signed)
 [facility] CSW Progress Note  Clinical Social Worker attempted to contact patient by phone to follow-up on message left by pt at reception.    Interventions: CSW informed pt inquired about gas cards at reception.  Pt has exhausted the [employer].  CSW attempted to reach pt by phone w/ no answer.  Voicemail left suggesting pt go to the LLS website to apply for a grant.  The link to the [employer] fund was emailed to pt by CSW.  CSW to follow up w/ pt at next infusion.        Follow Up Plan:  CSW will see pt at next infusion if no return call is received.     Devere JONELLE Manna, LCSW Clinical Social Worker [facility]

## 2023-10-21 NOTE — Progress Notes (Signed)
 Pt took home dexamethasone  40mg  prior to treatment.

## 2023-10-21 NOTE — Patient Instructions (Signed)
 [facility] - A DEPT OF [facility]  Discharge Instructions: Thank you for choosing [facility] to provide your oncology and hematology care.   If you have a lab appointment with the Cancer Center, please go directly to the Cancer Center and check in at the registration area.   Wear comfortable clothing and clothing appropriate for easy access to any Portacath or PICC line.   We strive to give you quality time with your provider. You may need to reschedule your appointment if you arrive late (15 or more minutes).  Arriving late affects you and other patients whose appointments are after yours.  Also, if you miss three or more appointments without notifying the office, you may be dismissed from the clinic at the provider's discretion.      For prescription refill requests, have your pharmacy contact our office and allow 72 hours for refills to be completed.    Today you received the following chemotherapy and/or immunotherapy agents Darzalex faspro      To help prevent nausea and vomiting after your treatment, we encourage you to take your nausea medication as directed.  BELOW ARE SYMPTOMS THAT SHOULD BE REPORTED IMMEDIATELY: *FEVER GREATER THAN 100.4 F (38 C) OR HIGHER *CHILLS OR SWEATING *NAUSEA AND VOMITING THAT IS NOT CONTROLLED WITH YOUR NAUSEA MEDICATION *UNUSUAL SHORTNESS OF BREATH *UNUSUAL BRUISING OR BLEEDING *URINARY PROBLEMS (pain or burning when urinating, or frequent urination) *BOWEL PROBLEMS (unusual diarrhea, constipation, pain near the anus) TENDERNESS IN MOUTH AND THROAT WITH OR WITHOUT PRESENCE OF ULCERS (sore throat, sores in mouth, or a toothache) UNUSUAL RASH, SWELLING OR PAIN  UNUSUAL VAGINAL DISCHARGE OR ITCHING   Items with * indicate a potential emergency and should be followed up as soon as possible or go to the Emergency Department if any problems should occur.  Please show the CHEMOTHERAPY ALERT CARD or  IMMUNOTHERAPY ALERT CARD at check-in to the Emergency Department and triage nurse.  Should you have questions after your visit or need to cancel or reschedule your appointment, please contact [facility] - A DEPT OF [facility]  Dept: [phone]  and follow the prompts.  Office hours are 8:00 a.m. to 4:30 p.m. Monday - Friday. Please note that voicemails left after 4:00 p.m. may not be returned until the following business day.  We are closed weekends and major holidays. You have access to a nurse at all times for urgent questions. Please call the main number to the clinic Dept: [phone] and follow the prompts.   For any non-urgent questions, you may also contact your provider using MyChart. We now offer e-Visits for anyone 40 and older to request care online for non-urgent symptoms. For details visit [email or web address].   Also download the MyChart app! Go to the app store, search "MyChart", open the app, select [facility], and log in with your MyChart username and password.

## 2023-10-22 ENCOUNTER — Other Ambulatory Visit: Payer: Self-pay

## 2023-10-22 LAB — KAPPA/LAMBDA LIGHT CHAINS
Kappa free light chain: 38 mg/L — ABNORMAL HIGH (ref 3.3–19.4)
Kappa, lambda light chain ratio: 1.37 (ref 0.26–1.65)
Lambda free light chains: 27.7 mg/L — ABNORMAL HIGH (ref 5.7–26.3)

## 2023-10-22 LAB — BETA 2 MICROGLOBULIN, SERUM: Beta-2 Microglobulin: 7.9 mg/L — ABNORMAL HIGH (ref 0.6–2.4)

## 2023-10-23 ENCOUNTER — Other Ambulatory Visit: Payer: Self-pay

## 2023-10-23 ENCOUNTER — Inpatient Hospital Stay

## 2023-10-23 ENCOUNTER — Inpatient Hospital Stay: Admitting: Licensed Clinical Social Worker

## 2023-10-23 DIAGNOSIS — D649 Anemia, unspecified: Secondary | ICD-10-CM

## 2023-10-23 DIAGNOSIS — Z5112 Encounter for antineoplastic immunotherapy: Secondary | ICD-10-CM | POA: Diagnosis not present

## 2023-10-23 DIAGNOSIS — C9000 Multiple myeloma not having achieved remission: Secondary | ICD-10-CM

## 2023-10-23 LAB — MULTIPLE MYELOMA PANEL, SERUM
Albumin SerPl Elph-Mcnc: 3.3 g/dL (ref 2.9–4.4)
Albumin/Glob SerPl: 1.5 (ref 0.7–1.7)
Alpha 1: 0.2 g/dL (ref 0.0–0.4)
Alpha2 Glob SerPl Elph-Mcnc: 0.9 g/dL (ref 0.4–1.0)
B-Globulin SerPl Elph-Mcnc: 0.9 g/dL (ref 0.7–1.3)
Gamma Glob SerPl Elph-Mcnc: 0.3 g/dL — ABNORMAL LOW (ref 0.4–1.8)
Globulin, Total: 2.3 g/dL (ref 2.2–3.9)
IgA: 68 mg/dL — ABNORMAL LOW (ref 90–386)
IgG (Immunoglobin G), Serum: 379 mg/dL — ABNORMAL LOW (ref 603–1613)
IgM (Immunoglobulin M), Srm: 26 mg/dL (ref 20–172)
M Protein SerPl Elph-Mcnc: 0.1 g/dL — ABNORMAL HIGH
Total Protein ELP: 5.6 g/dL — ABNORMAL LOW (ref 6.0–8.5)

## 2023-10-23 LAB — PREPARE RBC (CROSSMATCH)

## 2023-10-23 MED ORDER — ACETAMINOPHEN 325 MG PO TABS
650.0000 mg | ORAL_TABLET | Freq: Once | ORAL | Status: AC
  Administered 2023-10-23: 650 mg via ORAL
  Filled 2023-10-23: qty 2

## 2023-10-23 MED ORDER — DIPHENHYDRAMINE HCL 25 MG PO CAPS
25.0000 mg | ORAL_CAPSULE | Freq: Once | ORAL | Status: AC
  Administered 2023-10-23: 25 mg via ORAL
  Filled 2023-10-23: qty 1

## 2023-10-23 MED ORDER — SODIUM CHLORIDE 0.9% IV SOLUTION
250.0000 mL | INTRAVENOUS | Status: DC
  Administered 2023-10-23: 100 mL via INTRAVENOUS

## 2023-10-23 NOTE — Patient Instructions (Signed)

## 2023-10-23 NOTE — Progress Notes (Signed)
 [facility] CSW Progress Note  Visual merchandiser met with patient to follow-up on financial concerns.    Interventions: CSW checked in with pt in infusion to ensure pt had received the link for LLS to apply for a grant.  Pt confirmed he received the link and will be calling to follow up.  Pt reports he is doing well overall and is anticipating a stem cell transplant in the near future.  SSDI is still pending.  CSW to continue to provide support as appropriate throughout duration of treatment.        Follow Up Plan:  Patient will contact CSW with any support or resource needs    Devere JONELLE Manna, LCSW Clinical Social Worker [facility]

## 2023-10-23 NOTE — Progress Notes (Signed)
 Spoke with BB and confirmed blood orders.

## 2023-10-26 ENCOUNTER — Other Ambulatory Visit: Payer: Self-pay | Admitting: Internal Medicine

## 2023-10-26 ENCOUNTER — Other Ambulatory Visit: Payer: Self-pay | Admitting: Physician Assistant

## 2023-10-26 DIAGNOSIS — C9000 Multiple myeloma not having achieved remission: Secondary | ICD-10-CM

## 2023-10-27 LAB — TYPE AND SCREEN
ABO/RH(D): AB POS
Antibody Screen: POSITIVE
Donor AG Type: NEGATIVE
Unit division: 0

## 2023-10-27 LAB — BPAM RBC
Blood Product Expiration Date: 202508162359
ISSUE DATE / TIME: 202507251252
Unit Type and Rh: 6200

## 2023-11-10 ENCOUNTER — Other Ambulatory Visit: Payer: Self-pay

## 2023-11-11 ENCOUNTER — Encounter: Payer: Self-pay | Admitting: Internal Medicine

## 2023-11-18 ENCOUNTER — Inpatient Hospital Stay

## 2023-11-18 ENCOUNTER — Inpatient Hospital Stay: Attending: Internal Medicine | Admitting: Internal Medicine

## 2023-11-18 VITALS — BP 124/85 | HR 88 | Temp 97.9°F | Resp 16 | Ht 70.0 in | Wt 177.0 lb

## 2023-11-18 DIAGNOSIS — C9000 Multiple myeloma not having achieved remission: Secondary | ICD-10-CM

## 2023-11-18 DIAGNOSIS — D631 Anemia in chronic kidney disease: Secondary | ICD-10-CM

## 2023-11-18 DIAGNOSIS — Z5112 Encounter for antineoplastic immunotherapy: Secondary | ICD-10-CM | POA: Diagnosis present

## 2023-11-18 DIAGNOSIS — Z992 Dependence on renal dialysis: Secondary | ICD-10-CM | POA: Diagnosis not present

## 2023-11-18 DIAGNOSIS — N186 End stage renal disease: Secondary | ICD-10-CM | POA: Diagnosis not present

## 2023-11-18 LAB — CBC WITH DIFFERENTIAL (CANCER CENTER ONLY)
Abs Immature Granulocytes: 0.03 K/uL (ref 0.00–0.07)
Basophils Absolute: 0 K/uL (ref 0.0–0.1)
Basophils Relative: 1 %
Eosinophils Absolute: 0.1 K/uL (ref 0.0–0.5)
Eosinophils Relative: 2 %
HCT: 23.5 % — ABNORMAL LOW (ref 39.0–52.0)
Hemoglobin: 7.9 g/dL — ABNORMAL LOW (ref 13.0–17.0)
Immature Granulocytes: 1 %
Lymphocytes Relative: 13 %
Lymphs Abs: 0.7 K/uL (ref 0.7–4.0)
MCH: 33.5 pg (ref 26.0–34.0)
MCHC: 33.6 g/dL (ref 30.0–36.0)
MCV: 99.6 fL (ref 80.0–100.0)
Monocytes Absolute: 0.4 K/uL (ref 0.1–1.0)
Monocytes Relative: 8 %
Neutro Abs: 4.4 K/uL (ref 1.7–7.7)
Neutrophils Relative %: 75 %
Platelet Count: 151 K/uL (ref 150–400)
RBC: 2.36 MIL/uL — ABNORMAL LOW (ref 4.22–5.81)
RDW: 20.4 % — ABNORMAL HIGH (ref 11.5–15.5)
WBC Count: 5.8 K/uL (ref 4.0–10.5)
nRBC: 0 % (ref 0.0–0.2)

## 2023-11-18 LAB — SAMPLE TO BLOOD BANK

## 2023-11-18 MED ORDER — DIPHENHYDRAMINE HCL 25 MG PO CAPS
50.0000 mg | ORAL_CAPSULE | Freq: Once | ORAL | Status: AC
  Administered 2023-11-18: 50 mg via ORAL
  Filled 2023-11-18: qty 2

## 2023-11-18 MED ORDER — ACETAMINOPHEN 325 MG PO TABS
650.0000 mg | ORAL_TABLET | Freq: Once | ORAL | Status: AC
  Administered 2023-11-18: 650 mg via ORAL
  Filled 2023-11-18: qty 2

## 2023-11-18 MED ORDER — DARATUMUMAB-HYALURONIDASE-FIHJ 1800-30000 MG-UT/15ML ~~LOC~~ SOLN
1800.0000 mg | Freq: Once | SUBCUTANEOUS | Status: AC
  Administered 2023-11-18: 1800 mg via SUBCUTANEOUS
  Filled 2023-11-18: qty 15

## 2023-11-18 NOTE — Patient Instructions (Signed)
 [facility] - A DEPT OF [facility]  Discharge Instructions: Thank you for choosing [facility] to provide your oncology and hematology care.   If you have a lab appointment with the Cancer Center, please go directly to the Cancer Center and check in at the registration area.   Wear comfortable clothing and clothing appropriate for easy access to any Portacath or PICC line.   We strive to give you quality time with your provider. You may need to reschedule your appointment if you arrive late (15 or more minutes).  Arriving late affects you and other patients whose appointments are after yours.  Also, if you miss three or more appointments without notifying the office, you may be dismissed from the clinic at the provider's discretion.      For prescription refill requests, have your pharmacy contact our office and allow 72 hours for refills to be completed.    Today you received the following chemotherapy and/or immunotherapy agents Darzalex faspro      To help prevent nausea and vomiting after your treatment, we encourage you to take your nausea medication as directed.  BELOW ARE SYMPTOMS THAT SHOULD BE REPORTED IMMEDIATELY: *FEVER GREATER THAN 100.4 F (38 C) OR HIGHER *CHILLS OR SWEATING *NAUSEA AND VOMITING THAT IS NOT CONTROLLED WITH YOUR NAUSEA MEDICATION *UNUSUAL SHORTNESS OF BREATH *UNUSUAL BRUISING OR BLEEDING *URINARY PROBLEMS (pain or burning when urinating, or frequent urination) *BOWEL PROBLEMS (unusual diarrhea, constipation, pain near the anus) TENDERNESS IN MOUTH AND THROAT WITH OR WITHOUT PRESENCE OF ULCERS (sore throat, sores in mouth, or a toothache) UNUSUAL RASH, SWELLING OR PAIN  UNUSUAL VAGINAL DISCHARGE OR ITCHING   Items with * indicate a potential emergency and should be followed up as soon as possible or go to the Emergency Department if any problems should occur.  Please show the CHEMOTHERAPY ALERT CARD or  IMMUNOTHERAPY ALERT CARD at check-in to the Emergency Department and triage nurse.  Should you have questions after your visit or need to cancel or reschedule your appointment, please contact [facility] - A DEPT OF [facility]  Dept: [phone]  and follow the prompts.  Office hours are 8:00 a.m. to 4:30 p.m. Monday - Friday. Please note that voicemails left after 4:00 p.m. may not be returned until the following business day.  We are closed weekends and major holidays. You have access to a nurse at all times for urgent questions. Please call the main number to the clinic Dept: [phone] and follow the prompts.   For any non-urgent questions, you may also contact your provider using MyChart. We now offer e-Visits for anyone 40 and older to request care online for non-urgent symptoms. For details visit [email or web address].   Also download the MyChart app! Go to the app store, search "MyChart", open the app, select [facility], and log in with your MyChart username and password.

## 2023-11-18 NOTE — Progress Notes (Signed)
 [facility] [phone]   [phone]  OFFICE PROGRESS NOTE  [name], MD [address] [place] [address]  DIAGNOSIS: Multiple myeloma with lytic bone lesions in addition to renal insufficiency and bone marrow biopsy and aspirate consistent with plasma cell neoplasm with 53% plasma cells. This was diagnosed in August 2024.    PRIOR THERAPY:  Plasmapheresis status post ~4 treatments    CURRENT THERAPY: Systemic chemotherapy with subcutaneous Velcade  1.3 Mg/KG on days 1, 4, 8 and 11 every 3 weeks, cyclophosphamide  500 Mg/M2 IV on days 1 and 8 as well as Decadron  40 mg p.o. on weekly basis start with the first dose of Velcade .  He is status post 2 cycles.  Once his renal function improves, Dr. Sherrod will likely recommend changing treatment to standard treatment with daratumumab , Velcade , Revlimid and Decadron  for 10 cycles followed by evaluation for autologous stem cell transplant.  He is status post 5 cycles of the new regimen with daratumumab  subcutaneously, cyclophosphamide , subcutaneous Velcade  and dexamethasone  every 4 weeks   INTERVAL HISTORY: Drew Cooper 58 y.o. male returns to the clinic today for follow-up visit. Discussed the use of AI scribe software for clinical note transcription with the patient, who gave verbal consent to proceed.  History of Present Illness Drew Cooper is a 58 year old male with multiple myeloma who presents for evaluation before starting cycle eleven of systemic treatment.  He was diagnosed with multiple myeloma in August 2024 and is currently undergoing systemic treatment with subcutaneous Daratumumab , Velcade , and Decadron  every four weeks. He has completed ten cycles and is here for evaluation before starting cycle eleven. Revlimid was not given due to kidney issues.  He is undergoing hemodialysis three times a week and experiences fatigue, which he attributes to the dialysis sessions. His  hemoglobin level remains low at 7.9, consistent with previous results.  He is considering a transplant in [place] but has not yet made an appointment. Financial stress and the impact of his condition on his life are significant concerns.  He reflects on his past work at a [employer], where he was exposed to lead, and wonders if this could be related to his current health issues. He is contemplating consulting a lawyer regarding potential occupational exposure.  He is currently on Medicaid, which helps with his medical expenses. No new complaints or symptoms beyond those related to his ongoing treatment and dialysis.     MEDICAL HISTORY: Past Medical History:  Diagnosis Date   Cancer (HCC) 10/2022   multiple myeloma   Chronic kidney disease     ALLERGIES:  has no known allergies.  MEDICATIONS:  Current Outpatient Medications  Medication Sig Dispense Refill   acetaminophen  (TYLENOL ) 500 MG tablet Take 500 mg by mouth every 6 (six) hours as needed for moderate pain.     acyclovir  (ZOVIRAX ) 200 MG capsule Take 1 capsule (200 mg total) by mouth 2 (two) times daily. 60 capsule 2   B Complex-C-Folic Acid (RENA-VITE RX) 1 MG TABS Take 1 tablet by mouth.     Calcium  Carbonate (CALCIUM  600 PO) Take 600 mg by mouth 2 (two) times daily.     DARZALEX  FASPRO 1800-30000 MG-UT/15ML SOLN Inject into the skin.     dexamethasone  (DECADRON ) 4 MG tablet Take 10 tablets by mouth once a week. 40 tablet 4   gabapentin  (NEURONTIN ) 100 MG capsule TAKE ONE CAPSULE BY MOUTH THREE TIMES DAILY 90 capsule 1  lidocaine -prilocaine (EMLA) cream APPLY a thin AMOUNT TO TO THE ARM ONE hrr prior TO dialysis three times PER WEEK     oxyCODONE  (OXY IR/ROXICODONE ) 5 MG immediate release tablet Take 1 tablet (5 mg total) by mouth every 6 (six) hours as needed for moderate pain (pain score 4-6) (for pain score of 1-4). (Patient not taking: Reported on 10/21/2023) 12 tablet 0   prochlorperazine  (COMPAZINE ) 10 MG tablet Take 1  tablet (10 mg total) by mouth every 6 (six) hours as needed. 30 tablet 2   sevelamer carbonate (RENVELA) 800 MG tablet Take 800 mg by mouth 3 (three) times daily.     No current facility-administered medications for this visit.    SURGICAL HISTORY:  Past Surgical History:  Procedure Laterality Date   AV FISTULA PLACEMENT Left 03/26/2023   Procedure: LEFT ARM FISTULA CREATION;  Surgeon: Serene Gaile ORN, MD;  Location: [facility] OR;  Service: Vascular;  Laterality: Left;   IR FLUORO GUIDE CV LINE RIGHT  11/13/2022   IR US  GUIDE VASC ACCESS RIGHT  11/13/2022   KNEE SURGERY      REVIEW OF SYSTEMS:  A comprehensive review of systems was negative except for: Constitutional: positive for fatigue   PHYSICAL EXAMINATION: General appearance: alert, cooperative, fatigued, and no distress Head: Normocephalic, without obvious abnormality, atraumatic Neck: no adenopathy, no JVD, supple, symmetrical, trachea midline, and thyroid not enlarged, symmetric, no tenderness/mass/nodules Lymph nodes: Cervical, supraclavicular, and axillary nodes normal. Resp: clear to auscultation bilaterally Back: symmetric, no curvature. ROM normal. No CVA tenderness. Cardio: regular rate and rhythm, S1, S2 normal, no murmur, click, rub or gallop GI: soft, non-tender; bowel sounds normal; no masses,  no organomegaly Extremities: extremities normal, atraumatic, no cyanosis or edema  ECOG PERFORMANCE STATUS: 1 - Symptomatic but completely ambulatory  Blood pressure 124/85, pulse 88, temperature 97.9 F (36.6 C), temperature source Temporal, resp. rate 16, height 5' 10 (1.778 m), weight 177 lb (80.3 kg), SpO2 100%.  LABORATORY DATA: Lab Results  Component Value Date   WBC 5.8 11/18/2023   HGB 7.9 (L) 11/18/2023   HCT 23.5 (L) 11/18/2023   MCV 99.6 11/18/2023   PLT 151 11/18/2023      Chemistry      Component Value Date/Time   NA 135 10/21/2023 0826   K 3.3 (L) 10/21/2023 0826   CL 94 (L) 10/21/2023 0826   CO2 31  10/21/2023 0826   BUN 27 (H) 10/21/2023 0826   CREATININE 5.31 (H) 10/21/2023 0826      Component Value Date/Time   CALCIUM  8.5 (L) 10/21/2023 0826   ALKPHOS 53 10/21/2023 0826   AST 19 10/21/2023 0826   ALT 12 10/21/2023 0826   BILITOT 0.3 10/21/2023 0826       RADIOGRAPHIC STUDIES: No results found.   ASSESSMENT AND PLAN: This is a very pleasant 58 years old white male with multiple myeloma with lytic bone lesions in addition to renal insufficiency and bone marrow biopsy and aspirate consistent with plasma cell neoplasm with 53% plasma cells. This was diagnosed in August 2024. He is currently undergoing systemic chemotherapy with subcutaneous Velcade  1.3 Mg/KG on days 1, 4, 8 and 11 every 3 weeks, cyclophosphamide  500 Mg/M2 IV on days 1 and 8 as well as Decadron  40 mg p.o. on weekly basis start with the first dose of Velcade .  He is status post 3 cycles.  Unfortunately no improvement in his renal function so far He was referred to Dr. Fernande at [facility] who  recommended adding daratumumab  to his current regimen with the hope for improvement of his renal function.  He is status post 10 cycles of the new regimen.  He has been tolerating this treatment fairly well except for the fatigue. Assessment and Plan Assessment & Plan Multiple myeloma, not having achieved remission Diagnosed in August 2024. Undergoing systemic treatment with subcutaneous daratumumab , Velcade , and Decadron  every four weeks. Revlimid was not given due to renal issues. Potential occupational lead exposure discussed as a possible contributing factor, though unconfirmed. He is considering stem cell transplant in [place] but has not scheduled an appointment. - Administer cycle 11 of daratumumab , Velcade , and Decadron  today. - Discuss with transplant team regarding stem cell collection and potential transplant in [facility].  End-stage renal disease on hemodialysis Undergoing  hemodialysis three times a week. Discussed stem cell collection process, similar to dialysis. No new dialysis-related complaints.  Anemia in chronic disease Persistent anemia with hemoglobin at 7.9, consistent with previous levels. Symptoms include fatigue, likely exacerbated by dialysis. - Monitor hemoglobin levels. - Consider blood transfusion if fatigue worsens. The patient was advised to call immediately if he has any concerning symptoms in the interval The patient voices understanding of current disease status and treatment options and is in agreement with the current care plan.  All questions were answered. The patient knows to call the clinic with any problems, questions or concerns. We can certainly see the patient much sooner if necessary.  The total time spent in the appointment was 20 minutes.  Disclaimer: This note was dictated with voice recognition software. Similar sounding words can inadvertently be transcribed and may not be corrected upon review.

## 2023-11-18 NOTE — Progress Notes (Signed)
 Just took his dose of steroid today.

## 2023-11-28 ENCOUNTER — Other Ambulatory Visit: Payer: Self-pay

## 2023-12-07 NOTE — Progress Notes (Signed)
 Pre-Stem Cell Transplant Psychosocial Consultation   Drew Cooper  [id number]  January 10, 1966   12/07/2023  Time: [id number] [phone]  Location Information: Patient State (at time of visit): [place]  Patient Location (at time of visit):Home/Other Non-Medical  Provider Location: Hospital/Provider-Based Clinic Is provider licensed to provide clinical care in the current location/state of the patient? Yes   Consent:  Patient's identity was confirmed. Presenting condition or illness was discussed with the patient/personal representative. Current proposed treatment for presenting condition or illness was explained to patient/personal representative along with the likely benefits and any significant risks or complications associated with the provision of treatment by audio/video means. The patient/personal representative verbally authorized treatment to be provided by audio/video, which may include a limited review of patient's current health status, medication, or other treatment recommendations, patient education, and an opportunity to ask questions about condition and treatment. Verbal Consent Granted by Patient/Personal Representative:Yes   Visit Information: Modality: Audio-Only  Time Spent on Phone w/ Patient: 34 minutes   Electronic medical records were reviewed selectively.   HISTORY OF PRESENTING CONCERN Referral: Drew Cooper presents for a standard of care pre-transplant psychosocial conversation. Briefly, pt is a 58 year old male residing in [place], [place] with his wife, step daughter, grandson and granddaughter. He did not present for today's video visit, prompting a phone call. Pt shared that he was confused about the appt day and said that he had not established the patient portal yet. He elected to proceed with a phone consultation and engaged in a pleasant interpersonal fashion regarding the prospect of proceeding with stem cell transplant for management of his  multiple myeloma.   Below, please find initial impressions and additional information.  Mental Status Exam:   General Appearance UTA-telephone/video encounter  General Behavior cooperative and pleasant  Psychomotor Activity UTA-telephone/video  Gait and Station UTA-telephone/video encounter  Speech   normal fluency, normal volume, normal tone, and normal rate  Mood   euthymic  Affect    congruent  Thought Process linear, coherent, goal directed  Associations Intact  Thought Content/Perceptual Disturbances Not assessed on dos  Cognition/Sensorium  orientation intact (AAOx4)  Insight  age appropriate  Judgment age appropriate    ASSESSMENTS- Sent in advance via [facility] (DT, GAD7, PHQ9) but still not completed by time of visit.  NARRATIVE Drew Cooper presents today to discuss emotional adaptation in advance of stem cell transplant.  He endorsed some initial confusion surrounding the date of the appt but was amenable to talking with the writer over the phone. In brief, Drew Cooper is a 58 year old male who started experiencing significant shortness of breath over the course of a week, prompting a visit to urgent care. There, they performed lab work and notified him of the need to be admitted. During this admission, he underwent diagnostic work up and was diagnosed with multiple myeloma in August of 2024. He was found to be in renal failure and to have hyperCa and anemia. He underwent plasmapheresis and initiated CyBorD. He also was started on IHD. Pt seemingly has tolerated treatment well and was referred to [facility] for continued management of his myeloma. Drew Cooper reports some weakness, occasional hand cramping and some pain when urinating at times. He is able to complete most activities of daily living but does not have predictable energy levels that allow him to work.  Drew Cooper identifies his biggest source of stress as economic presently. His wife receives disability assistance presently. Drew Cooper was diagnosed  after quitting a job of 20 years at a [employer]. He ha  snot been approved for disability benefits and finds that he worries greatly about economic matters. 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  He continues to try to understand his timeline and the expectations for transplant. Pt shared that he is making his way through the literature provided. He also has not established his portal through [facility], limiting some of his access. He hopes to increase electronic access and identify additional resources for which he is eligible. We reviewed some options today.   Drew Cooper does not endorse experiencing sustained, clinically significant mood changes.Often, he reflects on all that he is balancing between his dialysis schedules and appointments related to myeloma.  He knows that he needs to invest in his health so he can achieve a sustained state of better health; however the multiple appointments that he juggles and the likelihood of stem cell transplant and continued dialysis and/or renal transplant feels overwhelming to him, he shared. Drew Cooper works to stay positive in his outlook. He does not endorse difficulties coping or the need for mood stabilizing medication at the present time. His daily life structure feels altered and less goal oriented currently.  He tries to get outdoors when able, performing some light gardening.  The numerous health issues that he is juggling in tandem with the inability to work feels overwhelming to him at times, he reports; but he is hopeful that he will be able to achieve a more manageable health state through transplant. Pt remains unclear about details of the transplant process, including outpt expectations.   Social & Familial Support: Pt is married to Drew Cooper.  She is receiving disability assistance.  They have a step daughter, granddaughter and grandson in the home who are supportive. Drew Cooper will  help with transportation. Other family members stop by and check in on them.  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 He was working third shift and having a difficult time maintaining the additional work demands that he was being asked to completed. He ultimately quit that job then unfortunately was diagnosed thereafter. He applied for disability last September 2024 and is considering hiring legal council to assist with his case.  Drew Cooper was approved for Medicaid, which helps to cover some medical bills. 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 Economic stress is a big stressor for him currently.  Substance usage history: Drew Cooper reported that he currently is not consuming alcohol nor is he smoking tobacco or using illicit drugs.  Previous Mental Health Treatment: none reported  Psychiatric Medications: not actively taking    We discussed the following care plan in relation to psychosocial well-being in the context of the upcoming intervention:   Emotional Adaptation and Coping  Drew Cooper continues to adapt to numerous major life transitions, including not working exterior to the home and the associated economic impacts as well as juggling health care for his diagnosis of renal failure and multiple myeloma. The appointments can feel cumbersome, and his energy levels continue to be unpredictable. He hopes to achieve a better sustained state of health and is hopeful that transplant is the first step towards doing so. That noted, he needs additional information about the details of the process. He endorses economic distress and appreciates any assistance with resources and finances possible. He is trying to stay positive and future oriented as able.  POP/CPSP Recs:  Writer explained the role and availability of Psychosocial Oncology team for patient and family members. Additionally, we discussed the  important role of emotional adaptation and coping in care engagement and outcomes. Pt does not endorse clinically significant mood changes but does experience a high level of distress surrounding economics. He appreciates connection with pertinent resources. Writer contacted Drew Cooper for assistance and also recommended Cancer Services. He shared that he had connected with a SW at [facility] as well. Monitor mood and adaptation as pt continues to balance care for myeloma and renal function.  Health Behaviors  A. Nutrition- reports appetite is relatively good  B. Exercise- energy level is unpredictable; tries to stay active around the home as able  and occasionally takes on side painting jobs  C. Sleep Quality- no major difficulties falling or staying asleep  D. Substance usage- does not report alcohol consumption or tobacco or illicit drug usage Targeted Recommendations: Encourage manageable amounts of exercise for mood, sleep and general conditioning as tolerable and safe  Cognitive function Does not report significant concerns related to cognition MOCA assessment prn    Relevant mood stabilizing Medication: none   Developmental/Phase of Life Considerations Not working exterior to the home; approved for Medicaid but not disability at the present time, resulting in economic hardship; pt shared that he is considering seeking legal representation to help with his case Seeking resources to assist with financial concerns, including social work  Clinical research associate recommended cancer services as well  Communication and Information Literacy Transplant Orientation Class: Drew Cooper will attend the transplant orientation class within our institution will attend a consent appointment. This orientation class will be facilitated by our transplant coordinator and includes detailed information exchange aimed to: 1) Decrease anxiety about transplant and hospital admission, 2) augment understanding of the  transplant process related to chemotherapy, blood counts, expected side effects, nursing considerations, transplant language, and 3) increase integration of transplant requirements both during hospitalization and after discharge including medications, appointments, expectations, common side effects. Other topics covered include: Team, Auto/Allo transplant process, Admission process, Patient/ family expectations, Unit Policies, Medications commonly used, Nutrition, Expected side effects, Post-transplant follow-up, Internet Resources. Pt will benefit from continued conversations with transplant coordinator to review details of transplant and associated expectations. He is making his way through the literature provided.  Social Support Primary: Wife, Drew Cooper Secondary: Step daughter Others: grandchildren in the home Specific Caregiver Needs: Drew Cooper will be taking him to appts throughout transplant; she is disabled and will be in the home with Drew Cooper  Coordination of care with other internal/external disciplines: 1.He has connected with a Child psychotherapist at [employer], who directed him to the LLS.  2. Writer reached out to Drew Cooper, who plans to connect with the pt as well 3. Dialysis team 4. Continuing to seek disability coverage Cancer Services may be a helpful community resource to engage for a variety of needs throughout care and survivorship.  Psychosocial Oncology Team Plan A. Pt was encouraged to continue to monitor mood and general adjustment. We discussed the important role of emotional well-being and adaptation within the context of care engagement and adaptation throughout the process. B. Pt will pay particular attention to mood and attempt to engage active coping mechanisms as he engages the process. C. Economic distress: continue to connect with resources who may be able to assist, including Drew Cooper; Clinical research associate reviewed other local resources, such as Naval architect.  Pt has connected  with a Personnel officer as well D. Recommend  continued education about transplant process and associated expectations of pt and spouse. E. Pt maintains limited access to portal information, as he has not yet established his health care portal connection.  He shared that he hoped to do this successfully to increase access to information and video visits. F. Continue to provide oral and written reinforcement of scheduled appts   We are happy to be a part of pt's care team with attention to psychosocial assessment and intervention in the outpatient and/or inpatient  setting. Writer provided contact information on how to reach us  for acute needs.   Logistical Needs: Pertinent economic resources    Due to the federally mandated 21st Century Cures Act Info Blocking Rule, all progress notes automatically appear in electronic medical records. Please be aware that anyone with access to your medical record, MyChart app, or [facility] account, including but not limited to your medical providers and family/friends if applicable, may be able to view potentially sensitive information about your physical and mental health.   Drew CHARLENA Ferns, PhD, PhD, [facility] Associate Professor Psychosocial Oncology and Cancer Patient Support Programs Electronically signed by: Katharine E Duckworth, PhD 12/07/2023 1:32 PM

## 2023-12-08 NOTE — Nursing Note (Signed)
 Pre procedure call made for scheduled procedure on 12-09-23.Spoke with patient, he will arrive at 0730, be NPO for moderate sedation for his bone marrow biopsy. His wife Rojelio will drive and he had no questions. BFL

## 2023-12-08 NOTE — Telephone Encounter (Signed)
 Bone Marrow Transplant Update  Drew Cooper MRN: [id number] DOB: 1965-11-11    Your patient, Drew Cooper, has been seen by Dr. Norleen Funk at [facility] for an autologous stem cell transplant.  We plan to move forward at this time.  The following are their projected appointment dates.  I will be in contact with Lonni to confirm and review all these appointments closer to the time.  Please do not hesitate to contact me with questions.    Please stop induction/ salvage chemotherapy on 9/11. 9/3, 9/10 Pre-BMT evaluation testing 9/19 Peer Review 9/22 Consent signing  9/25 Mobilization 9/29 Stem Cell Collection 10/8 HCT Start Date (In patient admission) 10/9 Transplant Day  **THESE DATES ARE SUBJECT TO CHANGE**

## 2023-12-08 NOTE — Telephone Encounter (Signed)
 The following letter was sent to the patient via email on 12/08/23. The listed documents were enclosed.  PLEASE READ ALL INFORMATION AND ATTACHED DOCUMENTS CAREFULLY!  Mr. Drew Cooper,  Enclosed you will find an agenda of your scheduled Pre-Transplant Evaluation appointments. Your appointments are scheduled on 9/3 and 9/10. The agenda details the time and location of each of your appointments.    I am including maps of the cancer center and main hospital, as these tests take place throughout the hospital. There are information desks scattered along the hallways that can point you in the right direction if you get turned around. These maps highlight locations that you may visit throughout the transplant process, so please hold on to these for future reference.  There is also an instruction sheet on how to collect a 24-hour urine sample. We ask that you bring this sample with you on the day of your evaluation (9/10) and hand it in to the lab tech when your blood work is collected. This means you should start the urine collection the day prior, 9/9. It is important that this urine sample be kept cold (on ice or in the fridge) and your write your name and date of birth on the side of the collection container. This urine is used to look for any multiple myeloma proteins in your urine.  The last piece of information is a calendar that lays out the next steps toward stem cell collection and transplant. Some of your pre-transplant appointments DO NOT show up on the [facility] online portal, so please use this calendar as your appointment guide. Depending on what we find with the testing, these dates can change, but, I am hoping that this will be very close to the actual timeline. I will go through all of these events with you a little closer to the time; however, some appointments to make note of are:  9/22 at 3 p.m. - Consent signing appointment with Dr. Norleen Funk  9/29 from 10 a.m. to 12 p.m. -  Transplant orientation class - I will do this class for you and your caregiver while you are collecting stem cells on this date.    The orientation class will help you know how to prepare for transplant both in the hospital and at home. As any unconfirmed appointments are scheduled I will update you with their times and locations.  As we are proceeding with the transplant process, you will need to stop your current chemotherapy (including any steroids) as of 9/11.   Dr. Jeannett office has been made aware of our plans for transplant, including the projected timeline and the dates of your last doses of chemotherapy. Please do not hesitate to get in touch with me if you have any questions.  Nat Daisy, MSN, RN, BMTCN, OCN

## 2023-12-09 ENCOUNTER — Encounter: Payer: Self-pay | Admitting: Internal Medicine

## 2023-12-09 NOTE — Anesthesia Postprocedure Evaluation (Signed)
 Operators (Fellow/Attending): Dr Madeleine /Dr Deidra Procedure: CT guided Bone Marrow Biopsy. Indication: Multiple myeloma Findings: Successful CT guided bone marrow biopsy.  Estimated blood loss (if applicable): None Specimens removed (if applicable): 2  aspirates, one bone core. Postoperative diagnosis: Successful CT guided bone marrow biopsy.

## 2023-12-09 NOTE — Pre-Procedure Assessment (Signed)
 Interventional Radiology Preprocedure Note   Patient: Drew Cooper Age: 58 y.o.  MRN: [id number] Attending: Deidra   Date: December 09, 2023 8:40 AM  Indication for procedure: Multiple myeloma  Relevant Labs:  Lab Results  Component Value Date   CREATININE 5.58 (H) 12/02/2023   INR 0.9 12/02/2023   PROTIME 9.9 12/02/2023    Sedation/Anesthesia: Moderate Sedation  Airway assessment: normal  Mallampati: II (hard and soft palate, upper portion of tonsils anduvula visible)  ASA Score: ASA 2 - Patient with mild systemic disease with no functional limitations  Benefits, risks and alternatives of procedure and planned sedation have been discussed with the patient and/or their representative. All questions answered and they agree to proceed.

## 2023-12-12 NOTE — Progress Notes (Deleted)
 [facility] OFFICE PROGRESS NOTE  [name], MD [address] [place] [address]  DIAGNOSIS:  Multiple myeloma with lytic bone lesions in addition to renal insufficiency and bone marrow biopsy and aspirate consistent with plasma cell neoplasm with 53% plasma cells. This was diagnosed in August 2024.   PRIOR THERAPY:  Plasmapheresis status post ~4 treatments  First dose of treatment given while inpatient with  subcutaneous Velcade  1.3 Mg/KG on days 1, 4, 8 and 11 every 3 weeks, cyclophosphamide  500 Mg/M2 IV on days 1 and 8 as well as Decadron  40 mg p.o. on weekly basis start with the first dose of Velcade . Dr. Sherrod added daratumumab . He is here for day 1 cycle #4 with Daratumumab , cytoxan , velcade , and dexamethasone .     CURRENT THERAPY: Plan for HCT at [facility].   INTERVAL HISTORY: Drew Cooper 58 y.o. male returns to the clinic today for a follow up visit. The patient was last seen in the clinic by Dr. Sherrod on 11/18/23. The patient recently saw Dr. Fernande at [facility]. Per their instructions with the timeline, the patient stopped salvage chemotherapy (including steroids) on 12/10/23. They plan to start on 01/06/24.   His energy levels are described as 'pretty good,' although he notes that the ongoing treatment can be tiring.   No recent fevers, chills, night sweats, or unintentional weight loss. No issues with breathing, chest pain, unusual shortness of breath, or cough. He denies any nausea, vomiting, diarrhea, constipation, or signs of infection such as skin infections or burning with urination. He also denies any abnormal bleeding or bruising, although he notes minor bleeding if he scrapes his arm.   He has been taking calcium  supplements twice daily.    MEDICAL HISTORY: Past Medical History:  Diagnosis Date   Cancer (HCC) 10/2022   multiple myeloma   Chronic kidney disease     ALLERGIES:  has no known allergies.  MEDICATIONS:  Current Outpatient  Medications  Medication Sig Dispense Refill   acetaminophen  (TYLENOL ) 500 MG tablet Take 500 mg by mouth every 6 (six) hours as needed for moderate pain.     acyclovir  (ZOVIRAX ) 200 MG capsule Take 1 capsule (200 mg total) by mouth 2 (two) times daily. 60 capsule 2   B Complex-C-Folic Acid (RENA-VITE RX) 1 MG TABS Take 1 tablet by mouth.     Calcium  Carbonate (CALCIUM  600 PO) Take 600 mg by mouth 2 (two) times daily.     DARZALEX  FASPRO 1800-30000 MG-UT/15ML SOLN Inject into the skin.     dexamethasone  (DECADRON ) 4 MG tablet Take 10 tablets by mouth once a week. 40 tablet 4   gabapentin  (NEURONTIN ) 100 MG capsule TAKE ONE CAPSULE BY MOUTH THREE TIMES DAILY 90 capsule 1   lidocaine -prilocaine (EMLA) cream APPLY a thin AMOUNT TO TO THE ARM ONE hrr prior TO dialysis three times PER WEEK     oxyCODONE  (OXY IR/ROXICODONE ) 5 MG immediate release tablet Take 1 tablet (5 mg total) by mouth every 6 (six) hours as needed for moderate pain (pain score 4-6) (for pain score of 1-4). (Patient not taking: Reported on 10/21/2023) 12 tablet 0   prochlorperazine  (COMPAZINE ) 10 MG tablet Take 1 tablet (10 mg total) by mouth every 6 (six) hours as needed. 30 tablet 2   sevelamer carbonate (RENVELA) 800 MG tablet Take 800 mg by mouth 3 (three) times daily.     No current facility-administered medications for this visit.    SURGICAL HISTORY:  Past Surgical History:  Procedure Laterality Date   AV FISTULA PLACEMENT Left 03/26/2023   Procedure: LEFT ARM FISTULA CREATION;  Surgeon: Serene Gaile ORN, MD;  Location: [facility] OR;  Service: Vascular;  Laterality: Left;   IR FLUORO GUIDE CV LINE RIGHT  11/13/2022   IR US  GUIDE VASC ACCESS RIGHT  11/13/2022   KNEE SURGERY      REVIEW OF SYSTEMS:   Review of Systems  Constitutional: Negative for appetite change, chills, fatigue, fever and unexpected weight change.  HENT:   Negative for mouth sores, nosebleeds, sore throat and trouble swallowing.   Eyes: Negative for eye  problems and icterus.  Respiratory: Negative for cough, hemoptysis, shortness of breath and wheezing.   Cardiovascular: Negative for chest pain and leg swelling.  Gastrointestinal: Negative for abdominal pain, constipation, diarrhea, nausea and vomiting.  Genitourinary: Negative for bladder incontinence, difficulty urinating, dysuria, frequency and hematuria.   Musculoskeletal: Negative for back pain, gait problem, neck pain and neck stiffness.  Skin: Negative for itching and rash.  Neurological: Negative for dizziness, extremity weakness, gait problem, headaches, light-headedness and seizures.  Hematological: Negative for adenopathy. Does not bruise/bleed easily.  Psychiatric/Behavioral: Negative for confusion, depression and sleep disturbance. The patient is not nervous/anxious.     PHYSICAL EXAMINATION:  There were no vitals taken for this visit.  ECOG PERFORMANCE STATUS: {CHL ONC ECOG [id number]  Physical Exam  Constitutional: Oriented to person, place, and time and well-developed, well-nourished, and in no distress. No distress.  HENT:  Head: Normocephalic and atraumatic.  Mouth/Throat: Oropharynx is clear and moist. No oropharyngeal exudate.  Eyes: Conjunctivae are normal. Right eye exhibits no discharge. Left eye exhibits no discharge. No scleral icterus.  Neck: Normal range of motion. Neck supple.  Cardiovascular: Normal rate, regular rhythm, normal heart sounds and intact distal pulses.   Pulmonary/Chest: Effort normal and breath sounds normal. No respiratory distress. No wheezes. No rales.  Abdominal: Soft. Bowel sounds are normal. Exhibits no distension and no mass. There is no tenderness.  Musculoskeletal: Normal range of motion. Exhibits no edema.  Lymphadenopathy:    No cervical adenopathy.  Neurological: Alert and oriented to person, place, and time. Exhibits normal muscle tone. Gait normal. Coordination normal.  Skin: Skin is warm and dry. No rash noted. Not  diaphoretic. No erythema. No pallor.  Psychiatric: Mood, memory and judgment normal.  Vitals reviewed.  LABORATORY DATA: Lab Results  Component Value Date   WBC 5.8 11/18/2023   HGB 7.9 (L) 11/18/2023   HCT 23.5 (L) 11/18/2023   MCV 99.6 11/18/2023   PLT 151 11/18/2023      Chemistry      Component Value Date/Time   NA 135 10/21/2023 0826   K 3.3 (L) 10/21/2023 0826   CL 94 (L) 10/21/2023 0826   CO2 31 10/21/2023 0826   BUN 27 (H) 10/21/2023 0826   CREATININE 5.31 (H) 10/21/2023 0826      Component Value Date/Time   CALCIUM  8.5 (L) 10/21/2023 0826   ALKPHOS 53 10/21/2023 0826   AST 19 10/21/2023 0826   ALT 12 10/21/2023 0826   BILITOT 0.3 10/21/2023 0826       RADIOGRAPHIC STUDIES:  No results found.   ASSESSMENT/PLAN:  This is a very pleasant 58 year old Caucasian male diagnosed with multiple myeloma with 53% plasma cells on bone marrow biopsy and aspirate.   The patient is here to establish care on an outpatient basis.  He was recently hospitalized in August 2020 for which showed his diagnosis of multiple myeloma.  While  admitted to the hospital he underwent day 1 cycle 1.   He rstarted treatment with subcutaneous Velcade  1.3 Mg/KG on days 1, 4, 8 and 11 every 3 weeks, cyclophosphamide  500 Mg/M2 IV on days 1 and 8 as well as Decadron  40 mg p.o. on weekly basis start with the first dose of Velcade  on 11/14/22.  In November 2024, daratumumab  was added.     His hemoglobin is *** today.  His creatinine is ***.   The patient is being considered for transplant on 01/06/24. Per their instructions, last day of salvage chemotherapy on 9/11.   Therefore, he will not get treatment and I will put his careplan on hold.   We will see him back after the transplant.   ***Blood.   The patient was advised to call immediately if he has any concerning symptoms in the interval. The patient voices understanding of current disease status and treatment options and is in agreement  with the current care plan. All questions were answered. The patient knows to call the clinic with any problems, questions or concerns. We can certainly see the patient much sooner if necessary     No orders of the defined types were placed in this encounter.    I spent {CHL ONC TIME VISIT - DTPQU:[id number]} counseling the patient face to face. The total time spent in the appointment was {CHL ONC TIME VISIT - DTPQU:[id number]}.  Jhony Antrim L Kmya Placide, PA-C 12/12/23

## 2023-12-15 ENCOUNTER — Other Ambulatory Visit: Payer: Self-pay | Admitting: Physician Assistant

## 2023-12-15 DIAGNOSIS — C9000 Multiple myeloma not having achieved remission: Secondary | ICD-10-CM

## 2023-12-16 ENCOUNTER — Inpatient Hospital Stay: Payer: Self-pay | Attending: Internal Medicine

## 2023-12-16 ENCOUNTER — Other Ambulatory Visit: Payer: Self-pay | Admitting: Physician Assistant

## 2023-12-16 ENCOUNTER — Inpatient Hospital Stay: Payer: Self-pay

## 2023-12-16 ENCOUNTER — Telehealth: Payer: Self-pay | Admitting: Physician Assistant

## 2023-12-16 ENCOUNTER — Encounter: Payer: Self-pay | Admitting: Internal Medicine

## 2023-12-16 ENCOUNTER — Inpatient Hospital Stay: Payer: Self-pay | Admitting: Physician Assistant

## 2023-12-16 NOTE — Telephone Encounter (Signed)
 The patient called because he has an appointment conflict today. However, this is ok per chart review as it looks like he is getting prepared for transplant at [facility] and the last day of treatment per their recommendation was last week on 12/10/23. I asked the patient to contact us  back in a few months after his transplant when Dr. Venda team recommends he follow back up with us  locally. He  expressed understanding.

## 2023-12-25 ENCOUNTER — Other Ambulatory Visit: Payer: Self-pay

## 2024-01-04 NOTE — Progress Notes (Signed)
 Drew Cooper arrived to the 7th floor clinic accompanied by his spouse (caregiver). Mozobil injection was administered per [facility] documentation. Patient was observed post injection without complaint. Caregiver and patient verbalized an understanding of emergency contacts and return appointment.

## 2024-01-04 NOTE — Progress Notes (Signed)
   Outpatient Oncology Social Work Supportive Oncology [facility]   Outpatient Oncology Social Work Note   PATIENT NAME: Drew Cooper DATE: 01/04/2024  TIME: 10:59 AM  Note Type: Progress Note Referral Source: Nat Daisy, RN Referral Reason: Financial and Other Transplant Patient Overview: Drew Cooper is a 58 y.o. with Multiple Myeloma.  Social Work Progress Note   MSW met with the patient and his wife in the Apheresis room to provide gas cards after receiving a request from Nat Daisy, Charity fundraiser. MSW also checked on the status of the patient's disability approval, as he had a scheduled call with his case worker the last time we spoke. The patient stated that they are still collecting medical records and expect him to be approved later this week.   Andres DELENA Nicolas, MSW

## 2024-01-05 NOTE — Progress Notes (Signed)
 Drew Cooper arrived to the 7th floor clinic. Mozobil injection was administered per [facility] documentation. Patient was observed post injection without complaint. Caregiver and patient verbalized an understanding of emergency contacts and return appointment.

## 2024-01-06 ENCOUNTER — Telehealth: Payer: Self-pay

## 2024-01-06 DIAGNOSIS — D649 Anemia, unspecified: Secondary | ICD-10-CM

## 2024-01-06 NOTE — Telephone Encounter (Signed)
 Called pt unable to reach left vm to call back to sch appt per Dr. Fernand to sch a f/u appt with her.

## 2024-01-06 NOTE — Telephone Encounter (Signed)
 Called pt unable to reach, left vm to call back to sch appt with Dr. Fernand for a F/U.

## 2024-01-06 NOTE — Telephone Encounter (Signed)
 Returned call to [place] with [facility] regarding pt needing dressing change on Hickman catheter 01/13/24 and 01/20/24 prior to his transplant. Nat also noted pt needed labs collected to monitor hgb and possible blood transfusion. Nat is faxing orders for dressing change and updated calendar for pt. Schedule message sent to get pt scheduled for dressing change and labs. Email sent to [employer] as well as charge nurses to be sure pt is scheduled correctly.

## 2024-01-06 NOTE — Progress Notes (Signed)
 Ahijah Devery (MRN [id number]) completed day 3 of a(n) Autologous hematopoietic cell (HPC) collection. Donor suitability confirmed per protocol before initiating the procedure.   The procedure was completed using right side triple lumen central line (Trifusion) accessed without difficulty. Ionized calcium  levels and symptoms of hypocalcemia were monitored during the procedure. Calcium  gluconate was given for prevention of symptoms of hypocalcemia tingling around the mouth. He had no symptoms of hypocalcemia. Post-collection vital signs were stable. After procedure, lumens were deaccessed per policy.  The After Visit Summary was prepared with Cellular Collection: What to expect... instructions including discharge instructions given and explained to [facility],  who expresses understanding. The patient received the After Visit Summary (AVS) electronically via My [facility], MyChart application.SABRA Lonni ambulated from apheresis treatment area  to go home.

## 2024-01-08 ENCOUNTER — Telehealth: Payer: Self-pay | Admitting: Internal Medicine

## 2024-01-08 NOTE — Telephone Encounter (Signed)
 Scheduled appointments with the patient per staff message. The patient is aware of the appointment details.

## 2024-01-10 ENCOUNTER — Other Ambulatory Visit: Payer: Self-pay

## 2024-01-11 ENCOUNTER — Other Ambulatory Visit: Payer: Self-pay | Admitting: Medical Oncology

## 2024-01-11 DIAGNOSIS — Z95828 Presence of other vascular implants and grafts: Secondary | ICD-10-CM

## 2024-01-11 DIAGNOSIS — C9000 Multiple myeloma not having achieved remission: Secondary | ICD-10-CM

## 2024-01-11 MED ORDER — HEPARIN SOD (PORK) LOCK FLUSH 100 UNIT/ML IV SOLN: 250.0000 [IU] | INTRAVENOUS | Status: AC | PRN

## 2024-01-11 MED ORDER — SODIUM CHLORIDE 0.9% FLUSH: 10.0000 mL | INTRAVENOUS | Status: AC | PRN

## 2024-01-11 NOTE — Telephone Encounter (Signed)
 Please verify whether a second PA is needed for mobilization medications, Nivestym and Mozobil, to be administered in the clinic (treatment plan entered), and obtain if necessary. Therapy start date 01/21/24.  Please note that this is the SECOND round of mobilization for this patient, as they failed to collect an adequate number of cells for transplant with previous moblization and collection attempt.   Medications are being used in combination for autologous stem cell (hematopoietic progenitor cell) mobilization and collection.  Primary ICD 10: [Z52.011] - autologous donor of stem cells Secondary ICD 10: [C90.00] - multiple myeloma

## 2024-01-12 ENCOUNTER — Other Ambulatory Visit: Payer: Self-pay | Admitting: *Deleted

## 2024-01-12 DIAGNOSIS — C9000 Multiple myeloma not having achieved remission: Secondary | ICD-10-CM

## 2024-01-13 ENCOUNTER — Telehealth: Payer: Self-pay | Admitting: Medical Oncology

## 2024-01-13 ENCOUNTER — Ambulatory Visit

## 2024-01-13 ENCOUNTER — Encounter: Payer: Self-pay | Admitting: Internal Medicine

## 2024-01-13 ENCOUNTER — Ambulatory Visit: Admitting: Internal Medicine

## 2024-01-13 ENCOUNTER — Inpatient Hospital Stay: Attending: Internal Medicine

## 2024-01-13 ENCOUNTER — Other Ambulatory Visit: Payer: Self-pay

## 2024-01-13 ENCOUNTER — Other Ambulatory Visit: Payer: Self-pay | Admitting: Medical Oncology

## 2024-01-13 ENCOUNTER — Other Ambulatory Visit

## 2024-01-13 ENCOUNTER — Inpatient Hospital Stay

## 2024-01-13 DIAGNOSIS — D649 Anemia, unspecified: Secondary | ICD-10-CM | POA: Diagnosis not present

## 2024-01-13 DIAGNOSIS — C9000 Multiple myeloma not having achieved remission: Secondary | ICD-10-CM | POA: Insufficient documentation

## 2024-01-13 LAB — CBC WITH DIFFERENTIAL (CANCER CENTER ONLY)
Abs Immature Granulocytes: 0.03 K/uL (ref 0.00–0.07)
Basophils Absolute: 0 K/uL (ref 0.0–0.1)
Basophils Relative: 1 %
Eosinophils Absolute: 0.1 K/uL (ref 0.0–0.5)
Eosinophils Relative: 2 %
HCT: 19.4 % — ABNORMAL LOW (ref 39.0–52.0)
Hemoglobin: 6.7 g/dL — CL (ref 13.0–17.0)
Immature Granulocytes: 0 %
Lymphocytes Relative: 5 %
Lymphs Abs: 0.4 K/uL — ABNORMAL LOW (ref 0.7–4.0)
MCH: 36.2 pg — ABNORMAL HIGH (ref 26.0–34.0)
MCHC: 34.5 g/dL (ref 30.0–36.0)
MCV: 104.9 fL — ABNORMAL HIGH (ref 80.0–100.0)
Monocytes Absolute: 0.4 K/uL (ref 0.1–1.0)
Monocytes Relative: 5 %
Neutro Abs: 6.7 K/uL (ref 1.7–7.7)
Neutrophils Relative %: 87 %
Platelet Count: 131 K/uL — ABNORMAL LOW (ref 150–400)
RBC: 1.85 MIL/uL — ABNORMAL LOW (ref 4.22–5.81)
RDW: 13.2 % (ref 11.5–15.5)
WBC Count: 7.8 K/uL (ref 4.0–10.5)
nRBC: 0 % (ref 0.0–0.2)

## 2024-01-13 LAB — PREPARE RBC (CROSSMATCH)

## 2024-01-13 LAB — SAMPLE TO BLOOD BANK

## 2024-01-13 NOTE — Progress Notes (Signed)
 Spoke with Drew Cooper in the blood bank and confirmed blood orders for 1 unit.

## 2024-01-13 NOTE — Progress Notes (Signed)
 CRITICAL VALUE STICKER  CRITICAL VALUE: hgb 6.7  RECEIVER (on-site recipient of call): Rosina  DATE & TIME NOTIFIED: 01/13/24 @ 1245  MESSENGER (representative from lab): Amber  MD NOTIFIED: Dr. Sherrod   TIME OF NOTIFICATION: 1246  RESPONSE: patient needs 1 unit of blood.

## 2024-01-13 NOTE — Telephone Encounter (Signed)
 Nat notified of our hickman flush protocol ( 250 u heparin  per lumen) and she will fax orders for same.

## 2024-01-15 ENCOUNTER — Inpatient Hospital Stay

## 2024-01-15 DIAGNOSIS — D649 Anemia, unspecified: Secondary | ICD-10-CM

## 2024-01-15 DIAGNOSIS — C9000 Multiple myeloma not having achieved remission: Secondary | ICD-10-CM | POA: Diagnosis not present

## 2024-01-15 MED ORDER — DIPHENHYDRAMINE HCL 25 MG PO CAPS
25.0000 mg | ORAL_CAPSULE | Freq: Once | ORAL | Status: AC
  Administered 2024-01-15: 25 mg via ORAL
  Filled 2024-01-15: qty 1

## 2024-01-15 MED ORDER — ACETAMINOPHEN 325 MG PO TABS
650.0000 mg | ORAL_TABLET | Freq: Once | ORAL | Status: AC
  Administered 2024-01-15: 650 mg via ORAL
  Filled 2024-01-15: qty 2

## 2024-01-15 MED ORDER — SODIUM CHLORIDE 0.9% IV SOLUTION
250.0000 mL | INTRAVENOUS | Status: DC
  Administered 2024-01-15: 100 mL via INTRAVENOUS

## 2024-01-15 NOTE — Patient Instructions (Signed)

## 2024-01-17 LAB — TYPE AND SCREEN
ABO/RH(D): AB POS
Antibody Screen: POSITIVE
Unit division: 0

## 2024-01-17 LAB — BPAM RBC
Blood Product Expiration Date: 202511052359
ISSUE DATE / TIME: 202510171119
Unit Type and Rh: 6200

## 2024-01-20 ENCOUNTER — Telehealth: Payer: Self-pay

## 2024-01-20 ENCOUNTER — Inpatient Hospital Stay

## 2024-01-20 NOTE — Telephone Encounter (Signed)
 I called patient because he was late for his appointment. He stated he did not know about his  appointment today and that he had a provider appointment at [facility]  tomorrow and they would do a dressing change there. I told him to call back  and make an appointment if they did not do it  tomorrow.

## 2024-01-22 ENCOUNTER — Other Ambulatory Visit: Payer: Self-pay

## 2024-01-27 ENCOUNTER — Other Ambulatory Visit: Payer: Self-pay

## 2024-02-09 ENCOUNTER — Encounter: Payer: Self-pay | Admitting: Internal Medicine

## 2024-02-09 ENCOUNTER — Telehealth: Payer: Self-pay | Admitting: *Deleted

## 2024-02-09 NOTE — Telephone Encounter (Signed)
 Kristen Navigator at [facility] requesting a follow up post Transplant appt. with Dr. Sherrod. Appt. Scheduled 04/04/24.

## 2024-02-10 ENCOUNTER — Other Ambulatory Visit: Payer: Self-pay

## 2024-02-18 ENCOUNTER — Other Ambulatory Visit: Payer: Self-pay | Admitting: Internal Medicine

## 2024-02-19 ENCOUNTER — Other Ambulatory Visit: Payer: Self-pay

## 2024-04-04 ENCOUNTER — Inpatient Hospital Stay: Payer: Self-pay | Attending: Internal Medicine | Admitting: Internal Medicine

## 2024-04-04 ENCOUNTER — Inpatient Hospital Stay: Payer: Self-pay

## 2024-04-04 VITALS — BP 110/68 | HR 100 | Temp 97.8°F | Resp 17 | Ht 70.0 in | Wt 160.0 lb

## 2024-04-04 DIAGNOSIS — D649 Anemia, unspecified: Secondary | ICD-10-CM

## 2024-04-04 DIAGNOSIS — C9000 Multiple myeloma not having achieved remission: Secondary | ICD-10-CM

## 2024-04-04 LAB — CBC WITH DIFFERENTIAL (CANCER CENTER ONLY)
Abs Immature Granulocytes: 0.02 K/uL (ref 0.00–0.07)
Basophils Absolute: 0.1 K/uL (ref 0.0–0.1)
Basophils Relative: 1 %
Eosinophils Absolute: 0.2 K/uL (ref 0.0–0.5)
Eosinophils Relative: 2 %
HCT: 24.1 % — ABNORMAL LOW (ref 39.0–52.0)
Hemoglobin: 8.1 g/dL — ABNORMAL LOW (ref 13.0–17.0)
Immature Granulocytes: 0 %
Lymphocytes Relative: 23 %
Lymphs Abs: 1.6 K/uL (ref 0.7–4.0)
MCH: 34.6 pg — ABNORMAL HIGH (ref 26.0–34.0)
MCHC: 33.6 g/dL (ref 30.0–36.0)
MCV: 103 fL — ABNORMAL HIGH (ref 80.0–100.0)
Monocytes Absolute: 0.7 K/uL (ref 0.1–1.0)
Monocytes Relative: 10 %
Neutro Abs: 4.6 K/uL (ref 1.7–7.7)
Neutrophils Relative %: 64 %
Platelet Count: 144 K/uL — ABNORMAL LOW (ref 150–400)
RBC: 2.34 MIL/uL — ABNORMAL LOW (ref 4.22–5.81)
RDW: 17.2 % — ABNORMAL HIGH (ref 11.5–15.5)
WBC Count: 7.1 K/uL (ref 4.0–10.5)
nRBC: 0 % (ref 0.0–0.2)

## 2024-04-04 LAB — SAMPLE TO BLOOD BANK

## 2024-04-04 NOTE — Progress Notes (Signed)
 "     [facility] [phone]   [phone]  OFFICE PROGRESS NOTE  [name], MD [address] [place] [address]  DIAGNOSIS: Multiple myeloma with lytic bone lesions in addition to renal insufficiency and bone marrow biopsy and aspirate consistent with plasma cell neoplasm with 53% plasma cells. This was diagnosed in August 2024.    PRIOR THERAPY:   1) Plasmapheresis status post ~4 treatments   2) Systemic chemotherapy with subcutaneous Velcade  1.3 Mg/KG on days 1, 4, 8 and 11 every 3 weeks, cyclophosphamide  500 Mg/M2 IV on days 1 and 8 as well as Decadron  40 mg p.o. on weekly basis start with the first dose of Velcade .  He is status post 2 cycles.  Once his renal function improves, Dr. Sherrod will likely recommend changing treatment to standard treatment with daratumumab , Velcade , Revlimid and Decadron  for 10 cycles followed by evaluation for autologous stem cell transplant.  He is status post 11 cycles of the new regimen with daratumumab  subcutaneously, cyclophosphamide , subcutaneous Velcade  and dexamethasone  every 4 weeks 3) status post autologous stem cell transplant with preoperative melphalan and stem cell infusion on February 03, 2024.  CURRENT THERAPY: None  INTERVAL HISTORY: WILIAN Cooper 59 y.o. male returns to the clinic today for follow-up visit. Discussed the use of AI scribe software for clinical note transcription with the patient, who gave verbal consent to proceed.  History of Present Illness Drew Cooper is a 59 year old male with multiple myeloma status post-autologous stem cell transplant who presents for post-transplant follow-up.  He is in the recovery phase following autologous stem cell transplant performed in November 2025 for multiple myeloma. The post-transplant course was complicated by a two-week hospitalization for illness, but he reports gradual improvement in his condition and currently denies new  symptoms or complaints. He has no issues related to myeloma or recent treatment at this time.  He continues to experience anemia with persistently low hemoglobin, but has not required transfusions. He remains on thrice-weekly dialysis for end-stage renal disease and reports no new symptoms related to renal disease.  He inquired about vitamin B complex and iron  supplementation.    MEDICAL HISTORY: Past Medical History:  Diagnosis Date   Cancer (HCC) 10/2022   multiple myeloma   Chronic kidney disease     ALLERGIES:  has no known allergies.  MEDICATIONS:  Current Outpatient Medications  Medication Sig Dispense Refill   acetaminophen  (TYLENOL ) 500 MG tablet Take 500 mg by mouth every 6 (six) hours as needed for moderate pain.     acyclovir  (ZOVIRAX ) 200 MG capsule Take 1 capsule (200 mg total) by mouth 2 (two) times daily. 60 capsule 2   B Complex-C-Folic Acid (RENA-VITE RX) 1 MG TABS Take 1 tablet by mouth.     Calcium  Carbonate (CALCIUM  600 PO) Take 600 mg by mouth 2 (two) times daily.     DARZALEX  FASPRO 1800-30000 MG-UT/15ML SOLN Inject into the skin.     dexamethasone  (DECADRON ) 4 MG tablet Take 10 tablets by mouth once a week. 40 tablet 4   gabapentin  (NEURONTIN ) 100 MG capsule TAKE ONE CAPSULE BY MOUTH THREE TIMES DAILY 90 capsule 1   lidocaine -prilocaine (EMLA) cream APPLY a thin AMOUNT TO TO THE ARM ONE hrr prior TO dialysis three times PER WEEK     oxyCODONE  (OXY IR/ROXICODONE ) 5 MG immediate release tablet Take 1 tablet (5 mg total) by mouth every 6 (six) hours as needed for moderate pain (pain score 4-6) (  for pain score of 1-4). (Patient not taking: Reported on 10/21/2023) 12 tablet 0   prochlorperazine  (COMPAZINE ) 10 MG tablet Take 1 tablet (10 mg total) by mouth every 6 (six) hours as needed. 30 tablet 2   sevelamer carbonate (RENVELA) 800 MG tablet Take 800 mg by mouth 3 (three) times daily.     No current facility-administered medications for this visit.    Facility-Administered Medications Ordered in Other Visits  Medication Dose Route Frequency Provider Last Rate Last Admin   heparin  lock flush 100 unit/mL  250 Units Intracatheter PRN Sherrod Sherrod, MD       heparin  lock flush 100 unit/mL  250 Units Intracatheter PRN Sherrod Sherrod, MD       heparin  lock flush 100 unit/mL  250 Units Intracatheter PRN Sherrod Sherrod, MD       sodium chloride  flush (NS) 0.9 % injection 10 mL  10 mL Intracatheter PRN Sherrod Sherrod, MD       sodium chloride  flush (NS) 0.9 % injection 10 mL  10 mL Intracatheter PRN Sherrod Sherrod, MD       sodium chloride  flush (NS) 0.9 % injection 10 mL  10 mL Intracatheter PRN Sherrod Sherrod, MD        SURGICAL HISTORY:  Past Surgical History:  Procedure Laterality Date   AV FISTULA PLACEMENT Left 03/26/2023   Procedure: LEFT ARM FISTULA CREATION;  Surgeon: Serene Gaile ORN, MD;  Location: [facility] OR;  Service: Vascular;  Laterality: Left;   IR FLUORO GUIDE CV LINE RIGHT  11/13/2022   IR US  GUIDE VASC ACCESS RIGHT  11/13/2022   KNEE SURGERY      REVIEW OF SYSTEMS:  Constitutional: positive for fatigue Eyes: negative Ears, nose, mouth, throat, and face: negative Respiratory: negative Cardiovascular: negative Gastrointestinal: negative Genitourinary:negative Integument/breast: negative Hematologic/lymphatic: negative Musculoskeletal:negative Neurological: negative Behavioral/Psych: negative Endocrine: negative Allergic/Immunologic: negative   PHYSICAL EXAMINATION: General appearance: alert, cooperative, fatigued, and no distress Head: Normocephalic, without obvious abnormality, atraumatic Neck: no adenopathy, no JVD, supple, symmetrical, trachea midline, and thyroid not enlarged, symmetric, no tenderness/mass/nodules Lymph nodes: Cervical, supraclavicular, and axillary nodes normal. Resp: clear to auscultation bilaterally Back: symmetric, no curvature. ROM normal. No CVA tenderness. Cardio: regular rate  and rhythm, S1, S2 normal, no murmur, click, rub or gallop GI: soft, non-tender; bowel sounds normal; no masses,  no organomegaly Extremities: extremities normal, atraumatic, no cyanosis or edema Neurologic: Alert and oriented X 3, normal strength and tone. Normal symmetric reflexes. Normal coordination and gait  ECOG PERFORMANCE STATUS: 1 - Symptomatic but completely ambulatory  Blood pressure 110/68, pulse 100, temperature 97.8 F (36.6 C), temperature source Temporal, resp. rate 17, height 5' 10 (1.778 m), weight 160 lb (72.6 kg), SpO2 100%.  LABORATORY DATA: Lab Results  Component Value Date   WBC 7.1 04/04/2024   HGB 8.1 (L) 04/04/2024   HCT 24.1 (L) 04/04/2024   MCV 103.0 (H) 04/04/2024   PLT 144 (L) 04/04/2024      Chemistry      Component Value Date/Time   NA 135 10/21/2023 0826   K 3.3 (L) 10/21/2023 0826   CL 94 (L) 10/21/2023 0826   CO2 31 10/21/2023 0826   BUN 27 (H) 10/21/2023 0826   CREATININE 5.31 (H) 10/21/2023 0826      Component Value Date/Time   CALCIUM  8.5 (L) 10/21/2023 0826   ALKPHOS 53 10/21/2023 0826   AST 19 10/21/2023 0826   ALT 12 10/21/2023 0826   BILITOT 0.3 10/21/2023 9173  RADIOGRAPHIC STUDIES: No results found.   ASSESSMENT AND PLAN: This is a very pleasant 59 years old white male with multiple myeloma with lytic bone lesions in addition to renal insufficiency and bone marrow biopsy and aspirate consistent with plasma cell neoplasm with 53% plasma cells. This was diagnosed in August 2024. He is currently undergoing systemic chemotherapy with subcutaneous Velcade  1.3 Mg/KG on days 1, 4, 8 and 11 every 3 weeks, cyclophosphamide  500 Mg/M2 IV on days 1 and 8 as well as Decadron  40 mg p.o. on weekly basis start with the first dose of Velcade .  He is status post 3 cycles.  Unfortunately no improvement in his renal function so far He was referred to Dr. Fernande at [facility] who recommended adding daratumumab  to his current  regimen with the hope for improvement of his renal function.  He is status post 11 cycles of the new regimen.  He has been tolerating this treatment fairly well except for the fatigue. The patient underwent autologous peripheral blood stem cell transplant on February 03, 2024.  He is recovering slowly from the transplant. Assessment and Plan Assessment & Plan Multiple myeloma, post-transplant Status post stem cell transplant in November for multiple myeloma. He had a complicated post-transplant course requiring two weeks of hospitalization but is now recovering without current complaints. No active therapy is indicated currently. Evaluation with the transplant team is scheduled for day 100 post-transplant. Future consolidation or maintenance therapy after six months post-transplant was discussed. - Checked monthly laboratory studies to assess disease status and recovery. - Scheduled follow-up with transplant team at day 100 post-transplant. - Discussed potential consolidation or maintenance therapy after six months post-transplant.  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 Supportive supplementation was discussed and approved. - Monitored hemoglobin and anemia status with monthly laboratory studies. - Approved vitamin B complex supplementation. - Approved iron  supplementation as needed.  End stage renal disease on dialysis He remains on thrice-weekly dialysis for end stage renal disease. No improvement in creatinine and no acute complaints related to dialysis or renal disease at this visit. - Continued current dialysis regimen. - Monitored renal function with periodic laboratory studies. The patient was advised to call immediately if he has any other concerning symptoms in the interval.  The patient voices understanding of current disease status and treatment options and is in agreement  with the current care plan.  All questions were answered. The patient knows to call the clinic with any problems, questions or concerns. We can certainly see the patient much sooner if necessary.  The total time spent in the appointment was 30 minutes including review of chart and various tests results, discussions about plan of care and coordination of care plan .   Disclaimer: This note was dictated with voice recognition software. Similar sounding words can inadvertently be transcribed and may not be corrected upon review.        "
